# Patient Record
Sex: Male | Born: 1950 | Race: White | Hispanic: No | Smoking: Former smoker
Health system: Southern US, Community
[De-identification: ages and names within clinical notes are randomized; demographics above are authoritative.]

## PROBLEM LIST (undated history)

## (undated) DIAGNOSIS — E538 Deficiency of other specified B group vitamins: Secondary | ICD-10-CM

## (undated) DIAGNOSIS — Z87442 Personal history of urinary calculi: Secondary | ICD-10-CM

## (undated) DIAGNOSIS — Z860100 Personal history of colon polyps, unspecified: Secondary | ICD-10-CM

## (undated) DIAGNOSIS — I10 Essential (primary) hypertension: Secondary | ICD-10-CM

## (undated) DIAGNOSIS — N183 Chronic kidney disease, stage 3 unspecified: Secondary | ICD-10-CM

## (undated) DIAGNOSIS — E119 Type 2 diabetes mellitus without complications: Secondary | ICD-10-CM

## (undated) DIAGNOSIS — R42 Dizziness and giddiness: Secondary | ICD-10-CM

## (undated) DIAGNOSIS — I2584 Coronary atherosclerosis due to calcified coronary lesion: Secondary | ICD-10-CM

## (undated) DIAGNOSIS — Z794 Long term (current) use of insulin: Secondary | ICD-10-CM

## (undated) DIAGNOSIS — E785 Hyperlipidemia, unspecified: Secondary | ICD-10-CM

## (undated) DIAGNOSIS — N529 Male erectile dysfunction, unspecified: Secondary | ICD-10-CM

## (undated) DIAGNOSIS — Z6841 Body Mass Index (BMI) 40.0 and over, adult: Secondary | ICD-10-CM

## (undated) DIAGNOSIS — E669 Obesity, unspecified: Secondary | ICD-10-CM

## (undated) DIAGNOSIS — M109 Gout, unspecified: Secondary | ICD-10-CM

## (undated) DIAGNOSIS — R7989 Other specified abnormal findings of blood chemistry: Secondary | ICD-10-CM

## (undated) HISTORY — DX: Long term (current) use of insulin: E11.9

## (undated) HISTORY — DX: Coronary atherosclerosis due to calcified coronary lesion: I25.84

## (undated) HISTORY — DX: Male erectile dysfunction, unspecified: N52.9

## (undated) HISTORY — DX: Personal history of urinary calculi: Z87.442

## (undated) HISTORY — DX: Gout, unspecified: M10.9

## (undated) HISTORY — DX: Deficiency of other specified B group vitamins: E53.8

## (undated) HISTORY — DX: Type 2 diabetes mellitus without complications: Z79.4

## (undated) HISTORY — DX: Type 2 diabetes mellitus without complications: E11.9

## (undated) HISTORY — PX: KIDNEY STONE SURGERY: SHX686

## (undated) HISTORY — DX: Personal history of colonic polyps: Z86.010

## (undated) HISTORY — DX: Hyperlipidemia, unspecified: E78.5

## (undated) HISTORY — DX: Personal history of colon polyps, unspecified: Z86.0100

## (undated) HISTORY — DX: Dizziness and giddiness: R42

## (undated) HISTORY — DX: Other specified abnormal findings of blood chemistry: R79.89

## (undated) HISTORY — DX: Chronic kidney disease, stage 3 unspecified: N18.30

## (undated) HISTORY — DX: Obesity, unspecified: E66.9

## (undated) HISTORY — DX: Essential (primary) hypertension: I10

## (undated) HISTORY — DX: Body Mass Index (BMI) 40.0 and over, adult: Z684

---

## 1997-09-15 ENCOUNTER — Encounter: Admission: RE | Admit: 1997-09-15 | Discharge: 1997-12-14 | Payer: Self-pay | Admitting: Emergency Medicine

## 1999-02-20 ENCOUNTER — Encounter: Admission: RE | Admit: 1999-02-20 | Discharge: 1999-05-21 | Payer: Self-pay | Admitting: Emergency Medicine

## 2004-03-13 ENCOUNTER — Ambulatory Visit (HOSPITAL_BASED_OUTPATIENT_CLINIC_OR_DEPARTMENT_OTHER): Admission: RE | Admit: 2004-03-13 | Discharge: 2004-03-13 | Payer: Self-pay | Admitting: Urology

## 2007-03-02 ENCOUNTER — Ambulatory Visit (HOSPITAL_COMMUNITY): Admission: RE | Admit: 2007-03-02 | Discharge: 2007-03-02 | Payer: Self-pay | Admitting: Gastroenterology

## 2007-03-02 ENCOUNTER — Encounter (INDEPENDENT_AMBULATORY_CARE_PROVIDER_SITE_OTHER): Payer: Self-pay | Admitting: Gastroenterology

## 2007-05-26 ENCOUNTER — Encounter: Admission: RE | Admit: 2007-05-26 | Discharge: 2007-05-26 | Payer: Self-pay | Admitting: Emergency Medicine

## 2007-06-28 ENCOUNTER — Encounter: Admission: RE | Admit: 2007-06-28 | Discharge: 2007-06-28 | Payer: Self-pay | Admitting: Emergency Medicine

## 2009-12-21 ENCOUNTER — Inpatient Hospital Stay (HOSPITAL_COMMUNITY): Admission: EM | Admit: 2009-12-21 | Discharge: 2009-12-22 | Payer: Self-pay | Admitting: Emergency Medicine

## 2009-12-21 IMAGING — CR DG CHEST 1V PORT
1 series · 1 of 1 positions shown · non-contrast
Comparison: Chest radiograph performed [DATE]

CLINICAL DATA: Mid chest pain.

PORTABLE CHEST - 1 VIEW

[AP]
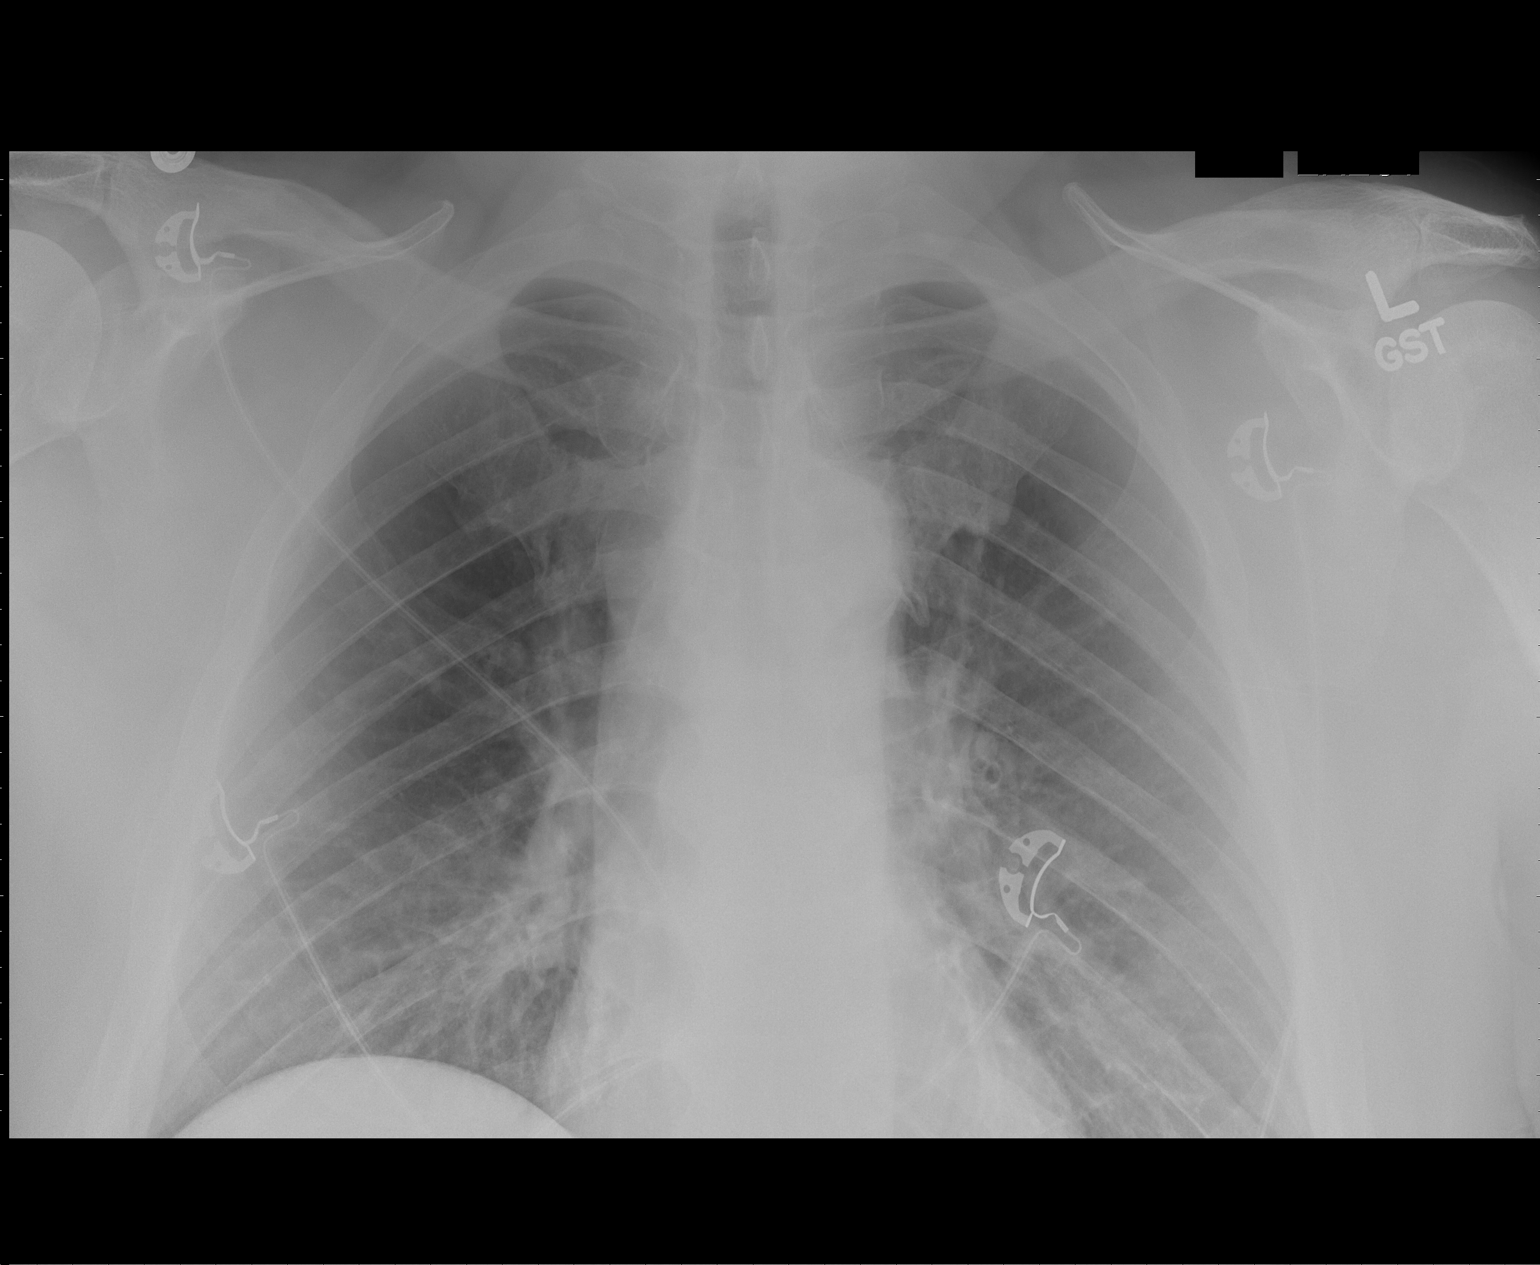

[1 of 1 positions shown; findings below may reference images not displayed]

FINDINGS: The lungs are well-aerated; mild peribronchial thickening
is noted.  There is no evidence of focal opacification, pleural
effusion or pneumothorax. The left costophrenic angle is
incompletely imaged on this study.

The cardiomediastinal silhouette is within normal limits.
Pulmonary vascularity is at the upper limits of normal.  No acute
osseous abnormalities are seen.
IMPRESSION: Mild peribronchial thickening noted; lungs otherwise clear.

## 2010-07-09 ENCOUNTER — Emergency Department (HOSPITAL_COMMUNITY)
Admission: EM | Admit: 2010-07-09 | Discharge: 2010-07-09 | Disposition: A | Discharge status: Home / Self Care | Payer: BC Managed Care – PPO | Attending: Emergency Medicine | Admitting: Emergency Medicine

## 2010-07-09 DIAGNOSIS — R109 Unspecified abdominal pain: Secondary | ICD-10-CM | POA: Insufficient documentation

## 2010-07-09 DIAGNOSIS — R61 Generalized hyperhidrosis: Secondary | ICD-10-CM | POA: Insufficient documentation

## 2010-07-09 DIAGNOSIS — N200 Calculus of kidney: Secondary | ICD-10-CM | POA: Insufficient documentation

## 2010-07-09 DIAGNOSIS — N210 Calculus in bladder: Secondary | ICD-10-CM | POA: Insufficient documentation

## 2010-07-10 ENCOUNTER — Emergency Department (HOSPITAL_COMMUNITY): Payer: BC Managed Care – PPO

## 2010-07-10 ENCOUNTER — Encounter (HOSPITAL_COMMUNITY): Payer: Self-pay | Admitting: Emergency Medicine

## 2010-08-24 LAB — BASIC METABOLIC PANEL
BUN: 19 mg/dL (ref 6–23)
CO2: 25 mEq/L (ref 19–32)
CO2: 29 mEq/L (ref 19–32)
Calcium: 9.4 mg/dL (ref 8.4–10.5)
Chloride: 99 mEq/L (ref 96–112)
Chloride: 99 mEq/L (ref 96–112)
Creatinine, Ser: 1.28 mg/dL (ref 0.4–1.5)
GFR calc Af Amer: >60 mL/min (ref >60)
GFR calc Af Amer: >60 mL/min (ref >60)
GFR calc non Af Amer: 57 mL/min — ABNORMAL LOW (ref >60)
Glucose, Bld: 240 mg/dL — ABNORMAL HIGH (ref 70–99)
Potassium: 4.1 mEq/L (ref 3.5–5.1)
Sodium: 136 mEq/L (ref 135–145)
Sodium: 137 mEq/L (ref 135–145)

## 2010-08-24 LAB — CARDIAC PANEL(CRET KIN+CKTOT+MB+TROPI)
CK, MB: 0.9 ng/mL (ref 0.3–4.0)
CK, MB: 1 ng/mL (ref 0.3–4.0)
Relative Index: INVALID (ref 0.0–2.5)
Relative Index: INVALID (ref 0.0–2.5)
Total CK: 75 U/L (ref 7–232)
Total CK: 75 U/L (ref 7–232)
Troponin I: 0.01 ng/mL (ref 0.00–0.06)
Troponin I: <0.01 ng/mL (ref 0.00–0.06)

## 2010-08-24 LAB — HEPATIC FUNCTION PANEL
ALT: 28 U/L (ref 0–53)
AST: 29 U/L (ref 0–37)
Albumin: 3.8 g/dL (ref 3.5–5.2)
Alkaline Phosphatase: 71 U/L (ref 39–117)
Bilirubin, Direct: 0.2 mg/dL (ref 0.0–0.3)
Indirect Bilirubin: 0.5 mg/dL (ref 0.3–0.9)
Total Bilirubin: 0.7 mg/dL (ref 0.3–1.2)
Total Protein: 7.3 g/dL (ref 6.0–8.3)

## 2010-08-24 LAB — GLUCOSE, CAPILLARY
Glucose-Capillary: 140 mg/dL — ABNORMAL HIGH (ref 70–99)
Glucose-Capillary: 156 mg/dL — ABNORMAL HIGH (ref 70–99)
Glucose-Capillary: 161 mg/dL — ABNORMAL HIGH (ref 70–99)
Glucose-Capillary: 186 mg/dL — ABNORMAL HIGH (ref 70–99)
Glucose-Capillary: 232 mg/dL — ABNORMAL HIGH (ref 70–99)

## 2010-08-24 LAB — PROTIME-INR
INR: 1.03 (ref 0.00–1.49)
Prothrombin Time: 13.4 seconds (ref 11.6–15.2)

## 2010-08-24 LAB — LIPID PANEL
Cholesterol: 108 mg/dL (ref 0–200)
HDL: 29 mg/dL — ABNORMAL LOW (ref >39)
LDL Cholesterol: 38 mg/dL (ref 0–99)
Total CHOL/HDL Ratio: 3.7 RATIO
Triglycerides: 205 mg/dL — ABNORMAL HIGH (ref <150)
VLDL: 41 mg/dL — ABNORMAL HIGH (ref 0–40)

## 2010-08-24 LAB — CBC
HCT: 45.8 % (ref 39.0–52.0)
Hemoglobin: 13.5 g/dL (ref 13.0–17.0)
Hemoglobin: 15.5 g/dL (ref 13.0–17.0)
MCH: 29.6 pg (ref 26.0–34.0)
MCH: 30 pg (ref 26.0–34.0)
MCHC: 33.8 g/dL (ref 30.0–36.0)
MCV: 87.6 fL (ref 78.0–100.0)
MCV: 87.6 fL (ref 78.0–100.0)
Platelets: 248 10*3/uL (ref 150–400)
RBC: 4.52 MIL/uL (ref 4.22–5.81)
RBC: 5.22 MIL/uL (ref 4.22–5.81)
RDW: 12.8 % (ref 11.5–15.5)
WBC: 21.6 10*3/uL — ABNORMAL HIGH (ref 4.0–10.5)
WBC: 9.9 10*3/uL (ref 4.0–10.5)

## 2010-08-24 LAB — CK TOTAL AND CKMB (NOT AT ARMC)
CK, MB: 1.1 ng/mL (ref 0.3–4.0)
Relative Index: INVALID (ref 0.0–2.5)
Total CK: 56 U/L (ref 7–232)

## 2010-08-24 LAB — POCT CARDIAC MARKERS
CKMB, poc: <1 ng/mL — ABNORMAL LOW (ref 1.0–8.0)
Myoglobin, poc: 67 ng/mL (ref 12–200)
Troponin i, poc: <0.05 ng/mL (ref 0.00–0.09)

## 2010-08-24 LAB — D-DIMER, QUANTITATIVE: D-Dimer, Quant: 3.76 ug/mL-FEU — ABNORMAL HIGH (ref 0.00–0.48)

## 2010-08-24 LAB — APTT: aPTT: 28 seconds (ref 24–37)

## 2010-08-24 LAB — TROPONIN I: Troponin I: 0.01 ng/mL (ref 0.00–0.06)

## 2010-10-22 NOTE — Op Note (Signed)
Steven Mcclure, Steven Mcclure               ACCOUNT NO.:  [id number]   MEDICAL RECORD NO.:  [id number]          PATIENT TYPE:  AMB   LOCATION:  ENDO                         FACILITY:  [facility]   PHYSICIAN:  James L. Malon Kindle., M.D.DATE OF BIRTH:  Aug 23, 1950   DATE OF PROCEDURE:  03/02/2007  DATE OF DISCHARGE:                               OPERATIVE REPORT   PROCEDURE:  Colonoscopy, polypectomy.   MEDICATIONS:  Fentanyl 125 mcg, Versed 8 mg IV.   INDICATIONS:  The patient had colonoscopy done in July with removal of  large polyp from the cecum.  It was not clear that this was all removed.  This was about 1.5 cm in diameter, was a villous polyp.  He is coming  back in three months for a repeat.  The plan is to use argon plasma  coagulator to cauterize any residual polyp left.   DESCRIPTION OF PROCEDURE:  The procedure been explained to the patient  and consent obtained.  With the patient in left lateral decubitus  position, the Pentax adult scope was inserted and advanced.  The prep  was excellent and we were able to reach the cecum without difficulty.  The ileocecal valve and appendiceal orifice were clearly seen.  The  scope was withdrawn.  The area carefully examined.  Just above the  ileocecal valve and on opposing wall was a 0.5 cm residual polyp.  This  was only abnormality seen.  This was removed with a snare and sucked  through the scope.  Initially it was lost and then was found.  Unfortunately the staff had not hooked up the cautery and it was taken  off with cold polypectomy.  There was bleeding.  I cauterize the tip of  the tip of the snare and injected 1 mL of epinephrine with good control  bleeding.  The remainder of the colon was normal until I reached the mid  descending colon where a 0.5 cm polyp was found and snared and  recovered.  There was diverticulosis throughout the left colon as  before.  The scope was withdrawn.  The patient tolerated procedure well.  There were no  immediate problems.   ASSESSMENT:  1. Polyps removed from the ascending and descending colon.  2. Diverticulosis.   PLAN:  Routine instructions.  Will go ahead and check the path and  likely recommend repeating procedure in a year or so.           ______________________________  Llana Aliment. Malon Kindle., M.D.     Waldron Session  D:  03/02/2007  T:  03/03/2007  Job:  [id number]   cc:   Oley Balm. Georgina Pillion, M.D.  Fax: [phone]

## 2010-10-25 NOTE — Op Note (Signed)
NAMEJAMEIS, NEWSHAM               ACCOUNT NO.:  [id number]   MEDICAL RECORD NO.:  [id number]          PATIENT TYPE:  AMB   LOCATION:  [facility]                         FACILITY:  [facility]   PHYSICIAN:  Rozanna Boer., M.D.DATE OF BIRTH:  05/09/1951   DATE OF PROCEDURE:  03/13/2004  DATE OF DISCHARGE:                                 OPERATIVE REPORT   PREOPERATIVE DIAGNOSIS:  Left distal ureteral stone with obstruction.   POSTOPERATIVE DIAGNOSIS:  Left distal ureteral stone with obstruction.   OPERATION:  1.  Cystoscopy.  2.  Left ureteroscopy with stone extraction.  3.  Insertion of left ureteral stent.   ANESTHESIA:  General.   SURGEON:  Courtney Paris, M.D.   BRIEF HISTORY:  This 60 year old patient is admitted with a left distal  ureteral stone, 4-5 mm, for ureteroscopy.  He had ureteroscopy x 3 in 1987  and then had open ureteral lithotomy on the left at that time.  He had  another ureteroscopy in 1991.  He has type 2 diabetes and enters now for a  distal left stone that has failed to progress with persistent pain.   The patient was placed on the operating table in the dorsal lithotomy  position after satisfactory induction of general anesthesia; prepped and  draped with Betadine in the usual sterile fashion.  The 21 panendoscope was  inserted.  The anterior urethra was free of any strictures.  The posterior  urethra was nonobstructing.  The bladder was entered.  The bladder was  smooth, nontrabeculated, but the left orifice had a fairly sharp angulation  to it.  I had to use a 6 open-ended ureteral catheter with a Glidewire  through it to be able to get into the orifice and then was able to pass the  Glidewire past the stone, and then the open-ended catheter was then advanced  over the Glidewire.  The Glidewire was then removed and replaced with a  guidewire, and the open-ended catheter was then removed.  I was afraid that  this was near the point of the old  ureterotomy when he had the stone removed  years earlier, so I went ahead and passed a 4 cm ureteral balloon dilator  and inflated this to 12 atmospheres for 3 timed minutes.  The balloon was  then deflated, and the 6 short ureteroscope was then passed up the ureter,  past the stone which was then grasped with a 4-wire flat basket and then  removed intact.  Another passage with the stone, looked at this area.  It  did not look like it was severely strictured and was fairly open but since I  had it dilated, I thought it would be best to leave a ureteral stent.  I  then re-passed the cystoscope and backloaded a 6 French 26 cm length double-  J ureteral stent over the guidewire and under fluoroscopy placed the stent  with the proximal coil in the renal pelvis and the distal coil in the  bladder in a satisfactory manner.  A string was left on the end which was  brought out  through the penis and taped to  the penis.  The patient was then given some Toradol and a B&O suppository  and taken to the recovery room in good condition to be later discharged as  an outpatient.  He will come back in 48 hours, and we will remove the stent  at that time.      HMK/MEDQ  D:  03/13/2004  T:  03/13/2004  Job:  [id number]

## 2011-10-27 ENCOUNTER — Other Ambulatory Visit: Payer: Self-pay | Admitting: Gastroenterology

## 2013-11-25 ENCOUNTER — Other Ambulatory Visit: Payer: Self-pay | Admitting: Dermatology

## 2016-09-02 ENCOUNTER — Ambulatory Visit (INDEPENDENT_AMBULATORY_CARE_PROVIDER_SITE_OTHER): Payer: PPO | Admitting: Podiatry

## 2016-09-02 ENCOUNTER — Ambulatory Visit (INDEPENDENT_AMBULATORY_CARE_PROVIDER_SITE_OTHER): Payer: PPO

## 2016-09-02 ENCOUNTER — Encounter: Payer: Self-pay | Admitting: Podiatry

## 2016-09-02 VITALS — BP 159/88 | HR 84 | Resp 16

## 2016-09-02 DIAGNOSIS — M79672 Pain in left foot: Secondary | ICD-10-CM

## 2016-09-02 DIAGNOSIS — M7752 Other enthesopathy of left foot: Secondary | ICD-10-CM | POA: Diagnosis not present

## 2016-09-02 DIAGNOSIS — M778 Other enthesopathies, not elsewhere classified: Secondary | ICD-10-CM

## 2016-09-02 DIAGNOSIS — M779 Enthesopathy, unspecified: Secondary | ICD-10-CM

## 2016-09-02 NOTE — Progress Notes (Signed)
   Subjective:    Patient ID: Steven Mcclure, male    DOB: 06-28-1950, 66 y.o.   MRN: [id number]  HPI: He presents today with a chief complaint of pain to the anterolateral ankle. He states this been going on for about 8 years on and off but seems to be worse over the past day or 2 so bad that he could hardly walk on it. He states that he has had a cyst removed the arthroscopy of the left ankle in the past. He said yesterday was so severely painful he states that he still limping today. He feels that he may have contributed to this soreness by overworking putting out mulch on the weekend.    Review of Systems  Musculoskeletal: Positive for arthralgias and gait problem.  All other systems reviewed and are negative.      Objective:   Physical Exam: Vital signs are stable he is alert and oriented 3 pulses are palpable. Neurologic sensorium is intact. Reflexes are intact. Muscle strength is 5 over 5 dorsiflexion plantar flexors and inverters everters all of his musculature is intact. Orthopedic evaluation of her symptoms all joint systems and ankle full range of motion without crepitation. He has pain on end range of motion of the subtalar joint of the left foot. Radiographs taken today do not demonstrate any type of major osseous abnormalities in this area however I began to question on the oblique view whether or not there is signs of a possible synchondrosis or syndesmosis. This will be at the level of the calcaneonavicular joint. This could relate to some of the tenderness that he has on palpation of the sinus tarsi and oriented range of motion. Cutaneous evaluation demonstrates no erythematous mild edema.        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  Plan: I injected sinus tarsi today with Kenalog and local anesthetic after sterile Betadine skin prep he tolerated the procedure well and I will follow-up with him in 1-2 weeks we discussed  appropriate shoe gear stretching exercises ice therapy and shoe modifications. I also placed him in a Tri-Lock brace.

## 2016-09-06 ENCOUNTER — Telehealth: Payer: Self-pay | Admitting: Podiatry

## 2016-09-06 NOTE — Telephone Encounter (Signed)
  I spoke with the patient's fiance, Carmelia Roller, regarding patient Steven Mcclure. She states that he is an significant pain to his ankle joint. She states that the injection she received earlier this week helped for approximately one day. Patient is unable to walk without pain. She states that he took 8 Tylenol pills and 8 aspirin pills throughout the night. I called in a prescription for tramadol 50mg  and Motrin 800mg . Go to Emergency Dept if condition worsens.   Edrick Kins, DPM [facility]  Dr. Edrick Kins, [address]                                        [place], [place] [address]                Office [phone]  Fax [phone]

## 2016-09-23 ENCOUNTER — Ambulatory Visit (INDEPENDENT_AMBULATORY_CARE_PROVIDER_SITE_OTHER): Payer: PPO | Admitting: Podiatry

## 2016-09-23 ENCOUNTER — Encounter: Payer: Self-pay | Admitting: Podiatry

## 2016-09-23 DIAGNOSIS — M778 Other enthesopathies, not elsewhere classified: Secondary | ICD-10-CM

## 2016-09-23 DIAGNOSIS — M779 Enthesopathy, unspecified: Principal | ICD-10-CM

## 2016-09-23 DIAGNOSIS — M7752 Other enthesopathy of left foot: Secondary | ICD-10-CM

## 2016-09-23 MED ORDER — TRAMADOL HCL 50 MG PO TABS: 50.0000 mg | ORAL_TABLET | Freq: Three times a day (TID) | ORAL | 0 refills | Status: DC | PRN

## 2016-09-23 MED ORDER — MELOXICAM 15 MG PO TABS: 15.0000 mg | ORAL_TABLET | Freq: Every day | ORAL | 3 refills | Status: DC

## 2016-09-23 NOTE — Progress Notes (Signed)
He presents today for follow-up of his subtalar joint capsulitis left foot. He states that he was very tender and painful just the other day and he called in and was prescribed tramadol.  Objective: Vital signs are stable he is alert and oriented 3. Still has some tenderness on palpation of the sinus tarsi and on in range of motion of the subtalar joint of the left foot.  Assessment: Subtalar joint capsulitis and neuritis  Plan: Injected Kenalog and local anesthetic. On maximal tenderness today into the sinus tarsi. Also wrote a prescription for tramadol. I also wrote another prescription for meloxicam. Discussed appropriate shoe gear stretching exercises ice therapy shoe modifications follow up with him in 1 month if necessary.

## 2016-10-21 ENCOUNTER — Ambulatory Visit: Payer: PPO | Admitting: Podiatry

## 2016-11-13 DIAGNOSIS — E119 Type 2 diabetes mellitus without complications: Secondary | ICD-10-CM | POA: Diagnosis not present

## 2016-12-01 DIAGNOSIS — L570 Actinic keratosis: Secondary | ICD-10-CM | POA: Diagnosis not present

## 2016-12-01 DIAGNOSIS — D225 Melanocytic nevi of trunk: Secondary | ICD-10-CM | POA: Diagnosis not present

## 2016-12-01 DIAGNOSIS — D1801 Hemangioma of skin and subcutaneous tissue: Secondary | ICD-10-CM | POA: Diagnosis not present

## 2016-12-01 DIAGNOSIS — Z85828 Personal history of other malignant neoplasm of skin: Secondary | ICD-10-CM | POA: Diagnosis not present

## 2016-12-01 DIAGNOSIS — D2371 Other benign neoplasm of skin of right lower limb, including hip: Secondary | ICD-10-CM | POA: Diagnosis not present

## 2016-12-01 DIAGNOSIS — L820 Inflamed seborrheic keratosis: Secondary | ICD-10-CM | POA: Diagnosis not present

## 2016-12-01 DIAGNOSIS — D2372 Other benign neoplasm of skin of left lower limb, including hip: Secondary | ICD-10-CM | POA: Diagnosis not present

## 2016-12-01 DIAGNOSIS — D235 Other benign neoplasm of skin of trunk: Secondary | ICD-10-CM | POA: Diagnosis not present

## 2016-12-01 DIAGNOSIS — L821 Other seborrheic keratosis: Secondary | ICD-10-CM | POA: Diagnosis not present

## 2016-12-16 DIAGNOSIS — E538 Deficiency of other specified B group vitamins: Secondary | ICD-10-CM | POA: Diagnosis not present

## 2016-12-16 DIAGNOSIS — Z6836 Body mass index (BMI) 36.0-36.9, adult: Secondary | ICD-10-CM | POA: Diagnosis not present

## 2016-12-16 DIAGNOSIS — E1142 Type 2 diabetes mellitus with diabetic polyneuropathy: Secondary | ICD-10-CM | POA: Diagnosis not present

## 2016-12-16 DIAGNOSIS — E669 Obesity, unspecified: Secondary | ICD-10-CM | POA: Diagnosis not present

## 2016-12-16 DIAGNOSIS — Z794 Long term (current) use of insulin: Secondary | ICD-10-CM | POA: Diagnosis not present

## 2016-12-16 DIAGNOSIS — E539 Vitamin B deficiency, unspecified: Secondary | ICD-10-CM | POA: Diagnosis not present

## 2016-12-26 DIAGNOSIS — N289 Disorder of kidney and ureter, unspecified: Secondary | ICD-10-CM | POA: Diagnosis not present

## 2017-01-26 DIAGNOSIS — E1142 Type 2 diabetes mellitus with diabetic polyneuropathy: Secondary | ICD-10-CM | POA: Diagnosis not present

## 2017-01-26 DIAGNOSIS — Z794 Long term (current) use of insulin: Secondary | ICD-10-CM | POA: Diagnosis not present

## 2017-01-26 DIAGNOSIS — Z7984 Long term (current) use of oral hypoglycemic drugs: Secondary | ICD-10-CM | POA: Diagnosis not present

## 2017-01-27 DIAGNOSIS — Z86010 Personal history of colonic polyps: Secondary | ICD-10-CM | POA: Diagnosis not present

## 2017-01-27 DIAGNOSIS — D124 Benign neoplasm of descending colon: Secondary | ICD-10-CM | POA: Diagnosis not present

## 2017-01-27 DIAGNOSIS — K640 First degree hemorrhoids: Secondary | ICD-10-CM | POA: Diagnosis not present

## 2017-01-27 DIAGNOSIS — K5730 Diverticulosis of large intestine without perforation or abscess without bleeding: Secondary | ICD-10-CM | POA: Diagnosis not present

## 2017-01-29 DIAGNOSIS — D124 Benign neoplasm of descending colon: Secondary | ICD-10-CM | POA: Diagnosis not present

## 2017-02-11 DIAGNOSIS — N400 Enlarged prostate without lower urinary tract symptoms: Secondary | ICD-10-CM | POA: Diagnosis not present

## 2017-02-11 DIAGNOSIS — N5201 Erectile dysfunction due to arterial insufficiency: Secondary | ICD-10-CM | POA: Diagnosis not present

## 2017-03-06 DIAGNOSIS — Z23 Encounter for immunization: Secondary | ICD-10-CM | POA: Diagnosis not present

## 2017-04-21 ENCOUNTER — Ambulatory Visit: Payer: PPO | Admitting: Podiatry

## 2017-04-21 ENCOUNTER — Encounter: Payer: Self-pay | Admitting: Podiatry

## 2017-04-21 DIAGNOSIS — M76822 Posterior tibial tendinitis, left leg: Secondary | ICD-10-CM

## 2017-04-21 DIAGNOSIS — M7752 Other enthesopathy of left foot: Secondary | ICD-10-CM

## 2017-04-21 DIAGNOSIS — M779 Enthesopathy, unspecified: Principal | ICD-10-CM

## 2017-04-21 DIAGNOSIS — M778 Other enthesopathies, not elsewhere classified: Secondary | ICD-10-CM

## 2017-04-21 MED ORDER — MELOXICAM 15 MG PO TABS: 15.0000 mg | ORAL_TABLET | Freq: Every day | ORAL | 3 refills | Status: DC

## 2017-04-21 NOTE — Progress Notes (Signed)
He presents today for follow-up of his subtalar joint capsulitis. He states that has some swelling in here and some tenderness as he points to the medial aspect of the left ankle as well. He states that his injections are lasting usually about 6 months now he is happy with that.  Objective: Vital signs are stable he is alert and oriented 3 months tenderness on palpation of the insertion site of posterior tibial tendon with mild overlying edema of the tendon appears to be intact with good inversion against resistance. He also has some tenderness on palpation of the sinus tarsi and range of motion of the subtalar joint.  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  Plan: Discussed etiology pathology conservative versus surgical therapies. At this point injected dexamethasone to the posterior tibial tendon and Kenalog to the subtalar joint of the left foot after sterile Betadine skin prep. Refilled his meloxicam. I will follow-up with him on an as-needed basis.

## 2017-05-06 DIAGNOSIS — G4733 Obstructive sleep apnea (adult) (pediatric): Secondary | ICD-10-CM | POA: Diagnosis not present

## 2017-06-16 DIAGNOSIS — I129 Hypertensive chronic kidney disease with stage 1 through stage 4 chronic kidney disease, or unspecified chronic kidney disease: Secondary | ICD-10-CM | POA: Diagnosis not present

## 2017-06-16 DIAGNOSIS — N183 Chronic kidney disease, stage 3 (moderate): Secondary | ICD-10-CM | POA: Diagnosis not present

## 2017-06-24 DIAGNOSIS — E1142 Type 2 diabetes mellitus with diabetic polyneuropathy: Secondary | ICD-10-CM | POA: Diagnosis not present

## 2017-06-24 DIAGNOSIS — E785 Hyperlipidemia, unspecified: Secondary | ICD-10-CM | POA: Diagnosis not present

## 2017-06-24 DIAGNOSIS — Z794 Long term (current) use of insulin: Secondary | ICD-10-CM | POA: Diagnosis not present

## 2017-06-24 DIAGNOSIS — I10 Essential (primary) hypertension: Secondary | ICD-10-CM | POA: Diagnosis not present

## 2017-06-24 DIAGNOSIS — Z6839 Body mass index (BMI) 39.0-39.9, adult: Secondary | ICD-10-CM | POA: Diagnosis not present

## 2017-06-25 DIAGNOSIS — E1142 Type 2 diabetes mellitus with diabetic polyneuropathy: Secondary | ICD-10-CM | POA: Diagnosis not present

## 2017-06-25 DIAGNOSIS — Z794 Long term (current) use of insulin: Secondary | ICD-10-CM | POA: Diagnosis not present

## 2017-06-25 DIAGNOSIS — Z6839 Body mass index (BMI) 39.0-39.9, adult: Secondary | ICD-10-CM | POA: Diagnosis not present

## 2017-06-25 DIAGNOSIS — E669 Obesity, unspecified: Secondary | ICD-10-CM | POA: Diagnosis not present

## 2017-06-25 DIAGNOSIS — E538 Deficiency of other specified B group vitamins: Secondary | ICD-10-CM | POA: Diagnosis not present

## 2017-08-20 DIAGNOSIS — H5789 Other specified disorders of eye and adnexa: Secondary | ICD-10-CM | POA: Diagnosis not present

## 2017-10-14 DIAGNOSIS — G4733 Obstructive sleep apnea (adult) (pediatric): Secondary | ICD-10-CM | POA: Diagnosis not present

## 2017-12-03 DIAGNOSIS — D235 Other benign neoplasm of skin of trunk: Secondary | ICD-10-CM | POA: Diagnosis not present

## 2017-12-03 DIAGNOSIS — L821 Other seborrheic keratosis: Secondary | ICD-10-CM | POA: Diagnosis not present

## 2017-12-03 DIAGNOSIS — D225 Melanocytic nevi of trunk: Secondary | ICD-10-CM | POA: Diagnosis not present

## 2017-12-03 DIAGNOSIS — Z85828 Personal history of other malignant neoplasm of skin: Secondary | ICD-10-CM | POA: Diagnosis not present

## 2017-12-03 DIAGNOSIS — L570 Actinic keratosis: Secondary | ICD-10-CM | POA: Diagnosis not present

## 2017-12-03 DIAGNOSIS — D1801 Hemangioma of skin and subcutaneous tissue: Secondary | ICD-10-CM | POA: Diagnosis not present

## 2017-12-03 DIAGNOSIS — L814 Other melanin hyperpigmentation: Secondary | ICD-10-CM | POA: Diagnosis not present

## 2017-12-15 DIAGNOSIS — E119 Type 2 diabetes mellitus without complications: Secondary | ICD-10-CM | POA: Diagnosis not present

## 2017-12-15 DIAGNOSIS — Z794 Long term (current) use of insulin: Secondary | ICD-10-CM | POA: Diagnosis not present

## 2017-12-15 DIAGNOSIS — Z7984 Long term (current) use of oral hypoglycemic drugs: Secondary | ICD-10-CM | POA: Diagnosis not present

## 2017-12-15 DIAGNOSIS — H52223 Regular astigmatism, bilateral: Secondary | ICD-10-CM | POA: Diagnosis not present

## 2017-12-15 DIAGNOSIS — H524 Presbyopia: Secondary | ICD-10-CM | POA: Diagnosis not present

## 2017-12-15 DIAGNOSIS — H25099 Other age-related incipient cataract, unspecified eye: Secondary | ICD-10-CM | POA: Diagnosis not present

## 2017-12-15 DIAGNOSIS — H5203 Hypermetropia, bilateral: Secondary | ICD-10-CM | POA: Diagnosis not present

## 2017-12-24 DIAGNOSIS — E538 Deficiency of other specified B group vitamins: Secondary | ICD-10-CM | POA: Diagnosis not present

## 2017-12-24 DIAGNOSIS — E669 Obesity, unspecified: Secondary | ICD-10-CM | POA: Diagnosis not present

## 2017-12-24 DIAGNOSIS — Z794 Long term (current) use of insulin: Secondary | ICD-10-CM | POA: Diagnosis not present

## 2017-12-24 DIAGNOSIS — E1142 Type 2 diabetes mellitus with diabetic polyneuropathy: Secondary | ICD-10-CM | POA: Diagnosis not present

## 2017-12-28 DIAGNOSIS — M25562 Pain in left knee: Secondary | ICD-10-CM | POA: Diagnosis not present

## 2017-12-30 ENCOUNTER — Ambulatory Visit (INDEPENDENT_AMBULATORY_CARE_PROVIDER_SITE_OTHER): Payer: PPO | Admitting: Surgery

## 2018-03-01 DIAGNOSIS — N400 Enlarged prostate without lower urinary tract symptoms: Secondary | ICD-10-CM | POA: Diagnosis not present

## 2018-03-08 DIAGNOSIS — N5201 Erectile dysfunction due to arterial insufficiency: Secondary | ICD-10-CM | POA: Diagnosis not present

## 2018-03-08 DIAGNOSIS — N400 Enlarged prostate without lower urinary tract symptoms: Secondary | ICD-10-CM | POA: Diagnosis not present

## 2018-06-21 DIAGNOSIS — G629 Polyneuropathy, unspecified: Secondary | ICD-10-CM | POA: Diagnosis not present

## 2018-06-21 DIAGNOSIS — E669 Obesity, unspecified: Secondary | ICD-10-CM | POA: Diagnosis not present

## 2018-06-21 DIAGNOSIS — Z794 Long term (current) use of insulin: Secondary | ICD-10-CM | POA: Diagnosis not present

## 2018-06-21 DIAGNOSIS — Z6839 Body mass index (BMI) 39.0-39.9, adult: Secondary | ICD-10-CM | POA: Diagnosis not present

## 2018-06-21 DIAGNOSIS — E1142 Type 2 diabetes mellitus with diabetic polyneuropathy: Secondary | ICD-10-CM | POA: Diagnosis not present

## 2018-06-21 DIAGNOSIS — E1165 Type 2 diabetes mellitus with hyperglycemia: Secondary | ICD-10-CM | POA: Diagnosis not present

## 2018-06-21 DIAGNOSIS — R6889 Other general symptoms and signs: Secondary | ICD-10-CM | POA: Diagnosis not present

## 2018-06-25 DIAGNOSIS — J208 Acute bronchitis due to other specified organisms: Secondary | ICD-10-CM | POA: Diagnosis not present

## 2018-07-12 DIAGNOSIS — G4733 Obstructive sleep apnea (adult) (pediatric): Secondary | ICD-10-CM | POA: Diagnosis not present

## 2018-07-19 DIAGNOSIS — I129 Hypertensive chronic kidney disease with stage 1 through stage 4 chronic kidney disease, or unspecified chronic kidney disease: Secondary | ICD-10-CM | POA: Diagnosis not present

## 2018-07-19 DIAGNOSIS — N183 Chronic kidney disease, stage 3 (moderate): Secondary | ICD-10-CM | POA: Diagnosis not present

## 2018-08-06 ENCOUNTER — Ambulatory Visit (INDEPENDENT_AMBULATORY_CARE_PROVIDER_SITE_OTHER): Payer: PPO

## 2018-08-06 ENCOUNTER — Other Ambulatory Visit: Payer: Self-pay | Admitting: Podiatry

## 2018-08-06 ENCOUNTER — Ambulatory Visit: Payer: PPO | Admitting: Podiatry

## 2018-08-06 DIAGNOSIS — M778 Other enthesopathies, not elsewhere classified: Secondary | ICD-10-CM

## 2018-08-06 DIAGNOSIS — M79671 Pain in right foot: Secondary | ICD-10-CM

## 2018-08-06 DIAGNOSIS — M109 Gout, unspecified: Secondary | ICD-10-CM

## 2018-08-06 DIAGNOSIS — M1000 Idiopathic gout, unspecified site: Secondary | ICD-10-CM

## 2018-08-06 DIAGNOSIS — M779 Enthesopathy, unspecified: Secondary | ICD-10-CM

## 2018-08-06 MED ORDER — TRIAMCINOLONE ACETONIDE 10 MG/ML IJ SUSP
10.0000 mg | Freq: Once | INTRAMUSCULAR | Status: AC
  Administered 2018-08-06: 10 mg

## 2018-08-06 MED ORDER — COLCHICINE 0.6 MG PO TABS: 0.6000 mg | ORAL_TABLET | Freq: Every day | ORAL | 0 refills | Status: DC

## 2018-08-07 ENCOUNTER — Other Ambulatory Visit: Payer: Self-pay | Admitting: Podiatry

## 2018-08-07 LAB — COMPLETE METABOLIC PANEL WITH GFR
AG Ratio: 1.3 (calc) (ref 1.0–2.5)
ALKALINE PHOSPHATASE (APISO): 71 U/L (ref 35–144)
ALT: 13 U/L (ref 9–46)
AST: 13 U/L (ref 10–35)
Albumin: 4.2 g/dL (ref 3.6–5.1)
BUN/Creatinine Ratio: 23 (calc) — ABNORMAL HIGH (ref 6–22)
BUN: 35 mg/dL — ABNORMAL HIGH (ref 7–25)
CHLORIDE: 100 mmol/L (ref 98–110)
CO2: 29 mmol/L (ref 20–32)
CREATININE: 1.52 mg/dL — AB (ref 0.70–1.25)
Calcium: 9.5 mg/dL (ref 8.6–10.3)
GFR, Est African American: 53 mL/min/{1.73_m2} — ABNORMAL LOW (ref >=60)
GFR, Est Non African American: 46 mL/min/{1.73_m2} — ABNORMAL LOW (ref >=60)
GLOBULIN: 3.3 g/dL (ref 1.9–3.7)
Glucose, Bld: 126 mg/dL — ABNORMAL HIGH (ref 65–99)
POTASSIUM: 4.7 mmol/L (ref 3.5–5.3)
SODIUM: 138 mmol/L (ref 135–146)
Total Bilirubin: 0.6 mg/dL (ref 0.2–1.2)
Total Protein: 7.5 g/dL (ref 6.1–8.1)

## 2018-08-07 LAB — CBC WITH DIFFERENTIAL/PLATELET
Absolute Monocytes: 1309 cells/uL — ABNORMAL HIGH (ref 200–950)
BASOS PCT: 0.7 %
Basophils Absolute: 108 cells/uL (ref 0–200)
EOS ABS: 108 {cells}/uL (ref 15–500)
EOS PCT: 0.7 %
HCT: 43.1 % (ref 38.5–50.0)
HEMOGLOBIN: 14.9 g/dL (ref 13.2–17.1)
Lymphs Abs: 2941 cells/uL (ref 850–3900)
MCH: 29.2 pg (ref 27.0–33.0)
MCHC: 34.6 g/dL (ref 32.0–36.0)
MCV: 84.3 fL (ref 80.0–100.0)
MONOS PCT: 8.5 %
MPV: 9.4 fL (ref 7.5–12.5)
NEUTROS ABS: 10934 {cells}/uL — AB (ref 1500–7800)
Neutrophils Relative %: 71 %
PLATELETS: 344 10*3/uL (ref 140–400)
RBC: 5.11 10*6/uL (ref 4.20–5.80)
RDW: 12.9 % (ref 11.0–15.0)
TOTAL LYMPHOCYTE: 19.1 %
WBC: 15.4 10*3/uL — ABNORMAL HIGH (ref 3.8–10.8)

## 2018-08-07 LAB — URIC ACID: URIC ACID, SERUM: 10 mg/dL — AB (ref 4.0–8.0)

## 2018-08-07 MED ORDER — DOXYCYCLINE HYCLATE 100 MG PO TABS: 100.0000 mg | ORAL_TABLET | Freq: Two times a day (BID) | ORAL | 0 refills | Status: DC

## 2018-08-07 NOTE — Progress Notes (Signed)
Subjective: 68 year old male presents the office today for concerns of right foot pain and swelling.  This started last Friday he states it came on quite suddenly.  He points on the first MPJ where he gets the majority of tenderness he also points the ankle.  He has not had any recent injury he denies any open sores.  He points more along the medial aspect the foot where he gets discomfort.  He has noticed increasing swelling or redness of the foot.  He does not have a history of gout that he can recall.  No recent injury. Denies any systemic complaints such as fevers, chills, nausea, vomiting. No acute changes since last appointment, and no other complaints at this time.   Objective: AAO x3, NAD DP/PT pulses palpable bilaterally, CRT less than 3 seconds There is moderate edema to the right foot compared to contra extremity and there is erythema mostly on the first MPJ and this is where he gets the majority of tenderness.  There is also tenderness along the ankle joint but to a lesser degree.  He is able to move the ankle joint although with some mild discomfort.  There is no open lesions identified.  There is no pain the lateral aspect the foot although there is swelling. No open lesions or pre-ulcerative lesions.  No pain with calf compression, swelling, warmth, erythema  Assessment: 68 year old male with right foot swelling, likely gout  Plan: -All treatment options discussed with the patient including all alternatives, risks, complications.  -X-rays were obtained and reviewed.  There was some erosive changes off the first metatarsal head consistent with gout changes but there is no definitive osteomyelitis identified or fracture. -Today he is requesting a steroid injection.  This was performed the first MPJ.  See procedure note below. -Prescribed colchicine. -I doubt infection given no open sores and the sudden onset.  He does have mild kidney reduction he states.  He has no open wounds.  There  is any worsening he needs to the emergency room. -Blood work ordered including CBC, uric acid, complete metabolic panel -Patient encouraged to call the office with any questions, concerns, change in symptoms.   Procedure: Injection small joint Discussed alternatives, risks, complications and verbal consent was obtained.  Location: Right first MPJ  Skin Prep: Betadine Injectate: 0.5cc 0.5% marcaine plain, 0.5 cc 2% lidocaine plain and, 1 cc kenalog 10. Disposition: Patient tolerated procedure well. Injection site dressed with a band-aid.  Post-injection care was discussed and return precautions discussed.   Return in about 1 week (around 08/13/2018).  Trula Slade DPM

## 2018-08-07 NOTE — Progress Notes (Signed)
I called patient to go over lab work. Uric acid level is high and want to do the colchicine but due to increase in WBC I sent doxycyline to the pharmacy. Left VM on his mobile. No answer on home number.

## 2018-08-12 ENCOUNTER — Encounter: Payer: Self-pay | Admitting: Podiatry

## 2018-08-12 ENCOUNTER — Ambulatory Visit (INDEPENDENT_AMBULATORY_CARE_PROVIDER_SITE_OTHER): Payer: PPO | Admitting: Podiatry

## 2018-08-12 DIAGNOSIS — M7751 Other enthesopathy of right foot: Secondary | ICD-10-CM | POA: Diagnosis not present

## 2018-08-12 MED ORDER — METHYLPREDNISOLONE 4 MG PO TBPK: ORAL_TABLET | ORAL | 0 refills | Status: DC

## 2018-08-12 NOTE — Progress Notes (Signed)
Presents today for follow-up of his foot and ankle states that is doing better after the injection that Dr. Carman Ching gave him but I think you need an injection in the ankle here.  Objective: Vital signs stable he is alert oriented x3.  Pulses are palpable.  He has swelling in the ankle much decrease in erythema of the first metatarsophalangeal joint mild erythema and tenderness on palpation of the medial ankle.  Inversion against resistance is intact though he does have tenderness at the talonavicular joint medially.  Mild erythema no cellulitis drainage odor.  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  Plan: Start him on a Medrol Dosepak and also injected the area with Kenalog 20 mg Kenalog 5 mg Marcaine point of maximal tenderness.  Tolerated procedure well.  He will wait to start the Medrol Dosepak for a couple of days to see if the injection helps.

## 2018-09-02 ENCOUNTER — Ambulatory Visit: Payer: PPO | Admitting: Podiatry

## 2018-09-30 ENCOUNTER — Telehealth: Payer: Self-pay | Admitting: Podiatry

## 2018-09-30 ENCOUNTER — Other Ambulatory Visit: Payer: Self-pay | Admitting: Podiatry

## 2018-09-30 DIAGNOSIS — M1000 Idiopathic gout, unspecified site: Secondary | ICD-10-CM

## 2018-09-30 MED ORDER — COLCHICINE 0.6 MG PO TABS: 0.6000 mg | ORAL_TABLET | Freq: Every day | ORAL | 0 refills | Status: DC

## 2018-09-30 NOTE — Progress Notes (Signed)
I called the

## 2018-09-30 NOTE — Telephone Encounter (Signed)
I called the patient. We will go back to the colchicine and sent to the pharmacy. Encouraged hydration. He thinks the gout is starting to come back. Some mild swelling and heat and it feels like it did before. No recent injury. Will recheck BMP and uric acid next week. Call with any changes.

## 2018-09-30 NOTE — Telephone Encounter (Signed)
Pt called stating that he believes he is having another gout flare up. Patient would like a call back from the nurse to discuss.

## 2018-09-30 NOTE — Telephone Encounter (Signed)
I spoke with patient, he stated that his toes on right foot are starting to hurt again and feel slightly hot to touch, which started yesterday.  He said he woke up with pain last night, but pain is not to bad today.  He is concerned about gout coming back.  He did tell me that he has a Medrol dose pack from last visit that was never filled and still had Colchicine.   Does he need to come back into office or can he go ahead and take the medrol dose pack?  He is aware that dose pack will raise his blood sugars.  Please advise.

## 2018-10-01 ENCOUNTER — Telehealth: Payer: Self-pay | Admitting: *Deleted

## 2018-10-01 DIAGNOSIS — M1000 Idiopathic gout, unspecified site: Secondary | ICD-10-CM

## 2018-10-01 DIAGNOSIS — M779 Enthesopathy, unspecified: Secondary | ICD-10-CM

## 2018-10-01 DIAGNOSIS — M778 Other enthesopathies, not elsewhere classified: Secondary | ICD-10-CM

## 2018-10-01 DIAGNOSIS — M7751 Other enthesopathy of right foot: Secondary | ICD-10-CM

## 2018-10-01 NOTE — Telephone Encounter (Signed)
-----   Message from Trula Slade, DPM sent at 09/30/2018  5:24 PM EDT ----- I wanted to order a uric acid and BMP for him and had to put it in as future for the order to go through. He is going to get it the end of next week. I didn't know if you had to redo it (I know you say they always call when it is put in as future)

## 2018-11-09 ENCOUNTER — Ambulatory Visit: Payer: PPO | Admitting: Podiatry

## 2018-11-09 ENCOUNTER — Ambulatory Visit (INDEPENDENT_AMBULATORY_CARE_PROVIDER_SITE_OTHER): Payer: PPO

## 2018-11-09 ENCOUNTER — Other Ambulatory Visit: Payer: Self-pay

## 2018-11-09 ENCOUNTER — Other Ambulatory Visit: Payer: Self-pay | Admitting: Podiatry

## 2018-11-09 ENCOUNTER — Encounter: Payer: Self-pay | Admitting: Podiatry

## 2018-11-09 VITALS — Temp 97.3°F

## 2018-11-09 DIAGNOSIS — S99912A Unspecified injury of left ankle, initial encounter: Secondary | ICD-10-CM | POA: Diagnosis not present

## 2018-11-09 DIAGNOSIS — M779 Enthesopathy, unspecified: Secondary | ICD-10-CM | POA: Diagnosis not present

## 2018-11-09 DIAGNOSIS — M19072 Primary osteoarthritis, left ankle and foot: Secondary | ICD-10-CM

## 2018-11-09 DIAGNOSIS — M778 Other enthesopathies, not elsewhere classified: Secondary | ICD-10-CM

## 2018-11-09 MED ORDER — TRIAMCINOLONE ACETONIDE 10 MG/ML IJ SUSP: 10.0000 mg | Freq: Once | INTRAMUSCULAR | Status: AC

## 2018-11-09 NOTE — Progress Notes (Signed)
Subjective: 68 year old male presents the office today for concerns of left ankle pain, lateral aspect.  He states about 10 years ago he had a surgery to the medial aspect and since then if he were to twist his foot slightly he gets pain and swelling to the lateral aspect.  He has previously seen Dr. Milinda Pointer for similar issue back in 2008 steroid injection was helpful.  He states that on Memorial Day he stepped in a small hole and twisted his foot. The next day he had pain, swelling to the outside. He states it is getting better but he is interested in a steroid injection today. Denies any systemic complaints such as fevers, chills, nausea, vomiting. No acute changes since last appointment, and no other complaints at this time.   Objective: AAO x3, NAD DP/PT pulses palpable bilaterally, CRT less than 3 seconds There is tenderness palpation on the lateral aspect the left foot on the sinus tarsi.  There is no area pinpoint tenderness to the ankle.  No pain along the flexor, extensor tendons otherwise.  Achilles tendon appears to be intact.  There is minimal swelling there is no erythema or warmth.  He states the right side is been doing well.  No open lesions or pre-ulcerative lesions.  No pain with calf compression, swelling, warmth, erythema  Assessment: Left subtalar joint capsulitis, uncertain  Plan: -All treatment options discussed with the patient including all alternatives, risks, complications.  -X-rays were obtained today.  There is no evidence of acute fracture.  Arthritic changes present of the foot and ankle particularly to the subtalar joint. -Steroid injection performed today.  See procedure note below. -Tri-Lock ankle brace dispensed for stability -Ice and elevation -Given kidney issues will hold off on anti-inflammatories; Tylenol as needed -Patient encouraged to call the office with any questions, concerns, change in symptoms.   Procedure: Injection intermediate joint Discussed  alternatives, risks, complications and verbal consent was obtained.  Location:  Skin Prep: Betaine Injectate: 0.5cc 0.5% marcaine plain, 0.5 cc 2% lidocaine plain and, 1 cc kenalog 10. Disposition: Patient tolerated procedure well. Injection site dressed with a band-aid.  Post-injection care was discussed and return precautions discussed.   Trula Slade DPM

## 2018-11-09 NOTE — Patient Instructions (Signed)
mws 

## 2018-11-30 ENCOUNTER — Ambulatory Visit: Payer: PPO | Admitting: Podiatry

## 2018-12-22 DIAGNOSIS — E1142 Type 2 diabetes mellitus with diabetic polyneuropathy: Secondary | ICD-10-CM | POA: Diagnosis not present

## 2018-12-22 DIAGNOSIS — Z794 Long term (current) use of insulin: Secondary | ICD-10-CM | POA: Diagnosis not present

## 2018-12-22 DIAGNOSIS — Z6839 Body mass index (BMI) 39.0-39.9, adult: Secondary | ICD-10-CM | POA: Diagnosis not present

## 2018-12-22 DIAGNOSIS — E669 Obesity, unspecified: Secondary | ICD-10-CM | POA: Diagnosis not present

## 2018-12-22 DIAGNOSIS — E1165 Type 2 diabetes mellitus with hyperglycemia: Secondary | ICD-10-CM | POA: Diagnosis not present

## 2019-01-17 DIAGNOSIS — Z794 Long term (current) use of insulin: Secondary | ICD-10-CM | POA: Diagnosis not present

## 2019-01-17 DIAGNOSIS — H524 Presbyopia: Secondary | ICD-10-CM | POA: Diagnosis not present

## 2019-01-17 DIAGNOSIS — H25099 Other age-related incipient cataract, unspecified eye: Secondary | ICD-10-CM | POA: Diagnosis not present

## 2019-01-17 DIAGNOSIS — H5203 Hypermetropia, bilateral: Secondary | ICD-10-CM | POA: Diagnosis not present

## 2019-01-17 DIAGNOSIS — E119 Type 2 diabetes mellitus without complications: Secondary | ICD-10-CM | POA: Diagnosis not present

## 2019-01-17 DIAGNOSIS — H52223 Regular astigmatism, bilateral: Secondary | ICD-10-CM | POA: Diagnosis not present

## 2019-02-07 DIAGNOSIS — Z6841 Body Mass Index (BMI) 40.0 and over, adult: Secondary | ICD-10-CM | POA: Diagnosis not present

## 2019-02-07 DIAGNOSIS — E1142 Type 2 diabetes mellitus with diabetic polyneuropathy: Secondary | ICD-10-CM | POA: Diagnosis not present

## 2019-02-07 DIAGNOSIS — E785 Hyperlipidemia, unspecified: Secondary | ICD-10-CM | POA: Diagnosis not present

## 2019-02-07 DIAGNOSIS — I10 Essential (primary) hypertension: Secondary | ICD-10-CM | POA: Diagnosis not present

## 2019-02-07 DIAGNOSIS — Z0000 Encounter for general adult medical examination without abnormal findings: Secondary | ICD-10-CM | POA: Diagnosis not present

## 2019-02-16 DIAGNOSIS — I10 Essential (primary) hypertension: Secondary | ICD-10-CM | POA: Diagnosis not present

## 2019-02-16 DIAGNOSIS — Z23 Encounter for immunization: Secondary | ICD-10-CM | POA: Diagnosis not present

## 2019-03-16 DIAGNOSIS — R351 Nocturia: Secondary | ICD-10-CM | POA: Diagnosis not present

## 2019-03-16 DIAGNOSIS — N5201 Erectile dysfunction due to arterial insufficiency: Secondary | ICD-10-CM | POA: Diagnosis not present

## 2019-03-16 DIAGNOSIS — N400 Enlarged prostate without lower urinary tract symptoms: Secondary | ICD-10-CM | POA: Diagnosis not present

## 2019-03-23 DIAGNOSIS — G4733 Obstructive sleep apnea (adult) (pediatric): Secondary | ICD-10-CM | POA: Diagnosis not present

## 2019-03-24 ENCOUNTER — Ambulatory Visit: Payer: PPO | Admitting: Podiatry

## 2019-05-11 DIAGNOSIS — Z85828 Personal history of other malignant neoplasm of skin: Secondary | ICD-10-CM | POA: Diagnosis not present

## 2019-05-11 DIAGNOSIS — D044 Carcinoma in situ of skin of scalp and neck: Secondary | ICD-10-CM | POA: Diagnosis not present

## 2019-05-11 DIAGNOSIS — L570 Actinic keratosis: Secondary | ICD-10-CM | POA: Diagnosis not present

## 2019-05-11 DIAGNOSIS — D225 Melanocytic nevi of trunk: Secondary | ICD-10-CM | POA: Diagnosis not present

## 2019-05-19 DIAGNOSIS — D044 Carcinoma in situ of skin of scalp and neck: Secondary | ICD-10-CM | POA: Diagnosis not present

## 2019-05-19 DIAGNOSIS — Z85828 Personal history of other malignant neoplasm of skin: Secondary | ICD-10-CM | POA: Diagnosis not present

## 2019-07-01 ENCOUNTER — Encounter: Payer: Self-pay | Admitting: Podiatry

## 2019-07-01 ENCOUNTER — Other Ambulatory Visit: Payer: Self-pay

## 2019-07-01 ENCOUNTER — Ambulatory Visit: Payer: PPO | Admitting: Podiatry

## 2019-07-01 ENCOUNTER — Ambulatory Visit (INDEPENDENT_AMBULATORY_CARE_PROVIDER_SITE_OTHER): Payer: PPO

## 2019-07-01 DIAGNOSIS — M109 Gout, unspecified: Secondary | ICD-10-CM

## 2019-07-01 DIAGNOSIS — M779 Enthesopathy, unspecified: Secondary | ICD-10-CM

## 2019-07-01 DIAGNOSIS — M1000 Idiopathic gout, unspecified site: Secondary | ICD-10-CM

## 2019-07-01 LAB — COMPLETE METABOLIC PANEL WITH GFR
AG Ratio: 1.4 (calc) (ref 1.0–2.5)
ALT: 13 U/L (ref 9–46)
AST: 17 U/L (ref 10–35)
Albumin: 4.2 g/dL (ref 3.6–5.1)
Alkaline phosphatase (APISO): 65 U/L (ref 35–144)
BUN/Creatinine Ratio: 15 (calc) (ref 6–22)
BUN: 23 mg/dL (ref 7–25)
CO2: 32 mmol/L (ref 20–32)
Calcium: 9.8 mg/dL (ref 8.6–10.3)
Chloride: 100 mmol/L (ref 98–110)
Creat: 1.52 mg/dL — ABNORMAL HIGH (ref 0.70–1.18)
GFR, Est African American: 53 mL/min/{1.73_m2} — ABNORMAL LOW (ref >=60)
GFR, Est Non African American: 46 mL/min/{1.73_m2} — ABNORMAL LOW (ref >=60)
Globulin: 3 g/dL (calc) (ref 1.9–3.7)
Glucose, Bld: 116 mg/dL — ABNORMAL HIGH (ref 65–99)
Potassium: 5.3 mmol/L (ref 3.5–5.3)
Sodium: 141 mmol/L (ref 135–146)
Total Bilirubin: 0.4 mg/dL (ref 0.2–1.2)
Total Protein: 7.2 g/dL (ref 6.1–8.1)

## 2019-07-01 LAB — CBC WITH DIFFERENTIAL/PLATELET
Absolute Monocytes: 971 cells/uL — ABNORMAL HIGH (ref 200–950)
Basophils Absolute: 105 cells/uL (ref 0–200)
Basophils Relative: 0.9 %
Eosinophils Absolute: 339 cells/uL (ref 15–500)
Eosinophils Relative: 2.9 %
HCT: 44.8 % (ref 38.5–50.0)
Hemoglobin: 15.2 g/dL (ref 13.2–17.1)
Lymphs Abs: 3136 cells/uL (ref 850–3900)
MCH: 29.5 pg (ref 27.0–33.0)
MCHC: 33.9 g/dL (ref 32.0–36.0)
MCV: 87 fL (ref 80.0–100.0)
MPV: 9.6 fL (ref 7.5–12.5)
Monocytes Relative: 8.3 %
Neutro Abs: 7149 cells/uL (ref 1500–7800)
Neutrophils Relative %: 61.1 %
Platelets: 296 10*3/uL (ref 140–400)
RBC: 5.15 10*6/uL (ref 4.20–5.80)
RDW: 13 % (ref 11.0–15.0)
Total Lymphocyte: 26.8 %
WBC: 11.7 10*3/uL — ABNORMAL HIGH (ref 3.8–10.8)

## 2019-07-01 LAB — URIC ACID: Uric Acid, Serum: 9.1 mg/dL — ABNORMAL HIGH (ref 4.0–8.0)

## 2019-07-01 NOTE — Progress Notes (Signed)
Subjective: 69 year old male presents the office today for concerns of pain to his right foot.  He states that he gets pain to the side of his foot as he points on the first MPJ going for the top.  He denies any recent injury.  He has had swelling as well as redness.  It came on suddenly and has gotten somewhat better over the last week. Denies any systemic complaints such as fevers, chills, nausea, vomiting. No acute changes since last appointment, and no other complaints at this time.   Objective: AAO x3, NAD DP/PT pulses palpable bilaterally, CRT less than 3 seconds Tenderness directly on the first MPJ medially there is localized edema and erythema to this area.  Mild edema to the medial aspect of foot there is no open lesions.  There is no fluctuance or crepitus or other signs of infection.  No open lesions or pre-ulcerative lesions.  No pain with calf compression, swelling, warmth, erythema  Assessment: Capsulitis, gout  Plan: -All treatment options discussed with the patient including all alternatives, risks, complications.  -X-rays were obtained and reviewed with the patient. Vessel calcification present. Mild arthritic changes. No evidence of acute fracture. -Steroid injection performed.  See procedure note below. -Blood work ordered. -Patient encouraged to call the office with any questions, concerns, change in symptoms.   Procedure: Injection small joint Discussed alternatives, risks, complications and verbal consent was obtained.  Location: Right first MPJ and medial to the joint Skin Prep: Betadine. Injectate: 0.5cc 0.5% marcaine plain, 0.5 cc 2% lidocaine plain and, 1 cc kenalog 10. Disposition: Patient tolerated procedure well. Injection site dressed with a band-aid.  Post-injection care was discussed and return precautions discussed.

## 2019-07-01 NOTE — Patient Instructions (Signed)
 Gout  Gout is painful swelling of your joints. Gout is a type of arthritis. It is caused by having too much uric acid in your body. Uric acid is a chemical that is made when your body breaks down substances called purines. If your body has too much uric acid, sharp crystals can form and build up in your joints. This causes pain and swelling. Gout attacks can happen quickly and be very painful (acute gout). Over time, the attacks can affect more joints and happen more often (chronic gout). What are the causes?  Too much uric acid in your blood. This can happen because: ? Your kidneys do not remove enough uric acid from your blood. ? Your body makes too much uric acid. ? You eat too many foods that are high in purines. These foods include organ meats, some seafood, and beer.  Trauma or stress. What increases the risk?  Having a family history of gout.  Being male and middle-aged.  Being male and having gone through menopause.  Being very overweight (obese).  Drinking alcohol, especially beer.  Not having enough water in the body (being dehydrated).  Losing weight too quickly.  Having an organ transplant.  Having lead poisoning.  Taking certain medicines.  Having kidney disease.  Having a skin condition called psoriasis. What are the signs or symptoms? An attack of acute gout usually happens in just one joint. The most common place is the big toe. Attacks often start at night. Other joints that may be affected include joints of the feet, ankle, knee, fingers, wrist, or elbow. Symptoms of an attack may include:  Very bad pain.  Warmth.  Swelling.  Stiffness.  Shiny, red, or purple skin.  Tenderness. The affected joint may be very painful to touch.  Chills and fever. Chronic gout may cause symptoms more often. More joints may be involved. You may also have white or yellow lumps (tophi) on your hands or feet or in other areas near your joints. How is this  treated?  Treatment for this condition has two phases: treating an acute attack and preventing future attacks.  Acute gout treatment may include: ? NSAIDs. ? Steroids. These are taken by mouth or injected into a joint. ? Colchicine. This medicine relieves pain and swelling. It can be given by mouth or through an IV tube.  Preventive treatment may include: ? Taking small doses of NSAIDs or colchicine daily. ? Using a medicine that reduces uric acid levels in your blood. ? Making changes to your diet. You may need to see a food expert (dietitian) about what to eat and drink to prevent gout. Follow these instructions at home: During a gout attack   If told, put ice on the painful area: ? Put ice in a plastic bag. ? Place a towel between your skin and the bag. ? Leave the ice on for 20 minutes, 2-3 times a day.  Raise (elevate) the painful joint above the level of your heart as often as you can.  Rest the joint as much as possible. If the joint is in your leg, you may be given crutches.  Follow instructions from your doctor about what you cannot eat or drink. Avoiding future gout attacks  Eat a low-purine diet. Avoid foods and drinks such as: ? Liver. ? Kidney. ? Anchovies. ? Asparagus. ? Herring. ? Mushrooms. ? Mussels. ? Beer.  Stay at a healthy weight. If you want to lose weight, talk with your doctor. Do not lose   weight too fast.  Start or continue an exercise plan as told by your doctor. Eating and drinking  Drink enough fluids to keep your pee (urine) pale yellow.  If you drink alcohol: ? Limit how much you use to:  0-1 drink a day for women.  0-2 drinks a day for men. ? Be aware of how much alcohol is in your drink. In the [place] one drink equals one 12 oz bottle of beer (355 mL), one 5 oz glass of wine (148 mL), or one 1 oz glass of hard liquor (44 mL). General instructions  Take over-the-counter and prescription medicines only as told by your doctor.  Do  not drive or use heavy machinery while taking prescription pain medicine.  Return to your normal activities as told by your doctor. Ask your doctor what activities are safe for you.  Keep all follow-up visits as told by your doctor. This is important. Contact a doctor if:  You have another gout attack.  You still have symptoms of a gout attack after 10 days of treatment.  You have problems (side effects) because of your medicines.  You have chills or a fever.  You have burning pain when you pee (urinate).  You have pain in your lower back or belly. Get help right away if:  You have very bad pain.  Your pain cannot be controlled.  You cannot pee. Summary  Gout is painful swelling of the joints.  The most common site of pain is the big toe, but it can affect other joints.  Medicines and avoiding some foods can help to prevent and treat gout attacks. This information is not intended to replace advice given to you by your health care provider. Make sure you discuss any questions you have with your health care provider. Document Revised: 12/16/2017 Document Reviewed: 12/16/2017 Elsevier Patient Education  2020 Elsevier Inc.  

## 2019-07-05 ENCOUNTER — Telehealth: Payer: Self-pay | Admitting: *Deleted

## 2019-07-05 NOTE — Telephone Encounter (Signed)
Pt states MyChart notification that the labs are available but he can not see.

## 2019-07-06 MED ORDER — ALLOPURINOL 100 MG PO TABS: 100.0000 mg | ORAL_TABLET | Freq: Every day | ORAL | 6 refills | Status: DC

## 2019-07-06 NOTE — Telephone Encounter (Signed)
Left message on pt's mobile for pt, that Dr. Jacqualyn Posey had left message on MyChart with explanation of results and recommendation, but he wanted to know how pt was doing.

## 2019-07-06 NOTE — Telephone Encounter (Signed)
Val- this is what I sen [facility] through [employer] Can you call him to follow up? Thanks.   The uric acid level is high, at 9.1. I think it would be best since you are getting repeated gout flares to possibly start a medication called allopurinol to help lower the uric acid levels to help prevent gout flares.    How are you feeling?

## 2019-07-06 NOTE — Telephone Encounter (Signed)
I spoke with pt and informed pt of Dr. Leigh Aurora review of results and recommendation of Allopurinol. Orders for allopurinol to [id number].

## 2019-07-13 ENCOUNTER — Other Ambulatory Visit: Payer: Self-pay | Admitting: Podiatry

## 2019-07-13 DIAGNOSIS — M779 Enthesopathy, unspecified: Secondary | ICD-10-CM

## 2019-07-13 DIAGNOSIS — M109 Gout, unspecified: Secondary | ICD-10-CM

## 2019-07-29 ENCOUNTER — Ambulatory Visit: Payer: PPO | Admitting: Podiatry

## 2019-07-30 ENCOUNTER — Ambulatory Visit: Payer: PPO | Attending: Internal Medicine

## 2019-07-30 DIAGNOSIS — Z23 Encounter for immunization: Secondary | ICD-10-CM | POA: Insufficient documentation

## 2019-07-30 NOTE — Progress Notes (Signed)
   Covid-19 Vaccination Clinic  Name:  Steven Mcclure    MRN: [id number] DOB: Oct 29, 1950  07/30/2019  [name] was observed post Covid-19 immunization for 15 minutes without incidence. He was provided with Vaccine Information Sheet and instruction to access the V-Safe system.   [name] was instructed to call 911 with any severe reactions post vaccine: Marland Kitchen Difficulty breathing  . Swelling of your face and throat  . A fast heartbeat  . A bad rash all over your body  . Dizziness and weakness    Immunizations Administered    Name Date Dose VIS Date Route   Pfizer COVID-19 Vaccine 07/30/2019  8:18 AM 0.3 mL 05/20/2019 Intramuscular   Manufacturer: [place]   Lot: [id number]   [place]: [id number]

## 2019-08-02 ENCOUNTER — Other Ambulatory Visit: Payer: Self-pay

## 2019-08-02 ENCOUNTER — Other Ambulatory Visit: Payer: Self-pay | Admitting: Podiatry

## 2019-08-02 ENCOUNTER — Ambulatory Visit: Payer: PPO | Admitting: Podiatry

## 2019-08-02 ENCOUNTER — Ambulatory Visit (INDEPENDENT_AMBULATORY_CARE_PROVIDER_SITE_OTHER): Payer: PPO

## 2019-08-02 ENCOUNTER — Encounter: Payer: Self-pay | Admitting: Podiatry

## 2019-08-02 DIAGNOSIS — M778 Other enthesopathies, not elsewhere classified: Secondary | ICD-10-CM

## 2019-08-02 DIAGNOSIS — M7751 Other enthesopathy of right foot: Secondary | ICD-10-CM

## 2019-08-02 DIAGNOSIS — M109 Gout, unspecified: Secondary | ICD-10-CM

## 2019-08-02 DIAGNOSIS — Z79899 Other long term (current) drug therapy: Secondary | ICD-10-CM

## 2019-08-02 MED ORDER — INDOMETHACIN 50 MG PO CAPS: 50.0000 mg | ORAL_CAPSULE | Freq: Two times a day (BID) | ORAL | 3 refills | Status: DC

## 2019-08-02 NOTE — Progress Notes (Signed)
He presents today for follow-up of capsulitis to his right foot.  States that he has been doing fine with allopurinol after the injections Dr. Jacqualyn Posey gave him last visit.  He states that he did go ahead and take the [employer].  He states that he just had this flareup starting last Thursday.  Objective: Vital signs are stable alert oriented x3.  Pulses are palpable.  Swollen right foot with pain on palpation of the sinus tarsi right and some medial malleolus pain.  Overlying mild edema overlying mild erythema in those areas.  Sinus tarsi is tender on palpation and range of motion is painful.  Assessment: Gouty arthritis subtalar joint right.  Plan: Discussed etiology pathology conservative therapies at this point we are going to draw a uric acid level a CBC and a CMP to make sure that his uric acid level is where he supposed to be with the allopurinol.  We may need to increase the allopurinol.  I did give him an injection today 20 mg Kenalog 5 mg Marcaine point maximal tenderness sinus tarsi right foot.  He tolerated the procedure well without complications.  Also wrote a prescription for indomethacin 50 mg 1 p.o. twice daily with food.  Follow-up with him in 1 month we will notify him as to the results and how to adjust the allopurinol.

## 2019-08-03 LAB — COMPLETE METABOLIC PANEL WITH GFR
AG Ratio: 1.2 (calc) (ref 1.0–2.5)
ALT: 12 U/L (ref 9–46)
AST: 16 U/L (ref 10–35)
Albumin: 3.9 g/dL (ref 3.6–5.1)
Alkaline phosphatase (APISO): 62 U/L (ref 35–144)
BUN/Creatinine Ratio: 21 (calc) (ref 6–22)
BUN: 34 mg/dL — ABNORMAL HIGH (ref 7–25)
CO2: 29 mmol/L (ref 20–32)
Calcium: 9.4 mg/dL (ref 8.6–10.3)
Chloride: 101 mmol/L (ref 98–110)
Creat: 1.61 mg/dL — ABNORMAL HIGH (ref 0.70–1.18)
GFR, Est African American: 49 mL/min/{1.73_m2} — ABNORMAL LOW (ref >=60)
GFR, Est Non African American: 43 mL/min/{1.73_m2} — ABNORMAL LOW (ref >=60)
Globulin: 3.2 g/dL (calc) (ref 1.9–3.7)
Glucose, Bld: 110 mg/dL — ABNORMAL HIGH (ref 65–99)
Potassium: 5 mmol/L (ref 3.5–5.3)
Sodium: 140 mmol/L (ref 135–146)
Total Bilirubin: 0.4 mg/dL (ref 0.2–1.2)
Total Protein: 7.1 g/dL (ref 6.1–8.1)

## 2019-08-03 LAB — CBC WITH DIFFERENTIAL/PLATELET
Absolute Monocytes: 1092 cells/uL — ABNORMAL HIGH (ref 200–950)
Basophils Absolute: 114 cells/uL (ref 0–200)
Basophils Relative: 0.9 %
Eosinophils Absolute: 470 cells/uL (ref 15–500)
Eosinophils Relative: 3.7 %
HCT: 42.3 % (ref 38.5–50.0)
Hemoglobin: 14.1 g/dL (ref 13.2–17.1)
Lymphs Abs: 3264 cells/uL (ref 850–3900)
MCH: 29.1 pg (ref 27.0–33.0)
MCHC: 33.3 g/dL (ref 32.0–36.0)
MCV: 87.2 fL (ref 80.0–100.0)
MPV: 9.2 fL (ref 7.5–12.5)
Monocytes Relative: 8.6 %
Neutro Abs: 7760 cells/uL (ref 1500–7800)
Neutrophils Relative %: 61.1 %
Platelets: 354 10*3/uL (ref 140–400)
RBC: 4.85 10*6/uL (ref 4.20–5.80)
RDW: 12.9 % (ref 11.0–15.0)
Total Lymphocyte: 25.7 %
WBC: 12.7 10*3/uL — ABNORMAL HIGH (ref 3.8–10.8)

## 2019-08-03 LAB — URIC ACID: Uric Acid, Serum: 7.9 mg/dL (ref 4.0–8.0)

## 2019-08-05 ENCOUNTER — Telehealth: Payer: Self-pay | Admitting: *Deleted

## 2019-08-05 DIAGNOSIS — E1142 Type 2 diabetes mellitus with diabetic polyneuropathy: Secondary | ICD-10-CM | POA: Diagnosis not present

## 2019-08-05 DIAGNOSIS — E669 Obesity, unspecified: Secondary | ICD-10-CM | POA: Diagnosis not present

## 2019-08-05 DIAGNOSIS — M109 Gout, unspecified: Secondary | ICD-10-CM | POA: Diagnosis not present

## 2019-08-05 DIAGNOSIS — Z7189 Other specified counseling: Secondary | ICD-10-CM | POA: Diagnosis not present

## 2019-08-05 DIAGNOSIS — E1165 Type 2 diabetes mellitus with hyperglycemia: Secondary | ICD-10-CM | POA: Diagnosis not present

## 2019-08-05 DIAGNOSIS — I10 Essential (primary) hypertension: Secondary | ICD-10-CM | POA: Diagnosis not present

## 2019-08-05 DIAGNOSIS — Z794 Long term (current) use of insulin: Secondary | ICD-10-CM | POA: Diagnosis not present

## 2019-08-05 MED ORDER — CEPHALEXIN 500 MG PO CAPS: 500.0000 mg | ORAL_CAPSULE | Freq: Four times a day (QID) | ORAL | 0 refills | Status: DC

## 2019-08-05 MED ORDER — ALLOPURINOL 100 MG PO TABS: 100.0000 mg | ORAL_TABLET | Freq: Two times a day (BID) | ORAL | 3 refills | Status: DC

## 2019-08-05 NOTE — Telephone Encounter (Signed)
Pt states his girlfriend is a triage nurse and will quiz him on his results and asked his WBC level. I informed pt of WBC 12.7 with [facility]'s high normal 10.8. Pt states understanding.

## 2019-08-05 NOTE — Telephone Encounter (Signed)
I informed pt of Dr. Stephenie Acres 08/03/2019 orders.

## 2019-08-05 NOTE — Telephone Encounter (Signed)
Faxed copy of 08/02/2019 labs to Dr. Sela Hilding.

## 2019-08-05 NOTE — Telephone Encounter (Signed)
-----   Message from Garrel Ridgel, [place] sent at 08/03/2019  7:11 AM EST ----- You can let him know that his uric acid is looking better but we are going to increase his allopurinol from 100 mg daily to 100 mg twice a day.  Please go ahead and prescribe this for him reading allopurinol 100 mg 1 p.o. twice daily dispense 180 with 3 refills.His white count is slightly elevated and appears to be somewhat chronic for him.  I would like to go ahead and at least cover him by getting him started on Keflex 500 mg 1 p.o. 3 times daily x7 days.Please forward these results to his primary care provider.I would like to see the patient in about 2 weeks.

## 2019-08-22 ENCOUNTER — Ambulatory Visit: Payer: PPO | Attending: Internal Medicine

## 2019-08-22 DIAGNOSIS — Z23 Encounter for immunization: Secondary | ICD-10-CM

## 2019-08-22 NOTE — Progress Notes (Signed)
   Covid-19 Vaccination Clinic  Name:  Steven Mcclure    MRN: [id number] DOB: 08/08/1950  08/22/2019  [name] was observed post Covid-19 immunization for 15 minutes without incident. He was provided with Vaccine Information Sheet and instruction to access the V-Safe system.   [name] was instructed to call 911 with any severe reactions post vaccine: Marland Kitchen Difficulty breathing  . Swelling of face and throat  . A fast heartbeat  . A bad rash all over body  . Dizziness and weakness   Immunizations Administered    Name Date Dose VIS Date Route   Pfizer COVID-19 Vaccine 08/22/2019  8:21 AM 0.3 mL 05/20/2019 Intramuscular   Manufacturer: [place]   Lot: [id number]   [place]: [id number]

## 2019-09-01 ENCOUNTER — Ambulatory Visit: Payer: PPO | Admitting: Podiatry

## 2019-11-30 DIAGNOSIS — D2371 Other benign neoplasm of skin of right lower limb, including hip: Secondary | ICD-10-CM | POA: Diagnosis not present

## 2019-11-30 DIAGNOSIS — D1801 Hemangioma of skin and subcutaneous tissue: Secondary | ICD-10-CM | POA: Diagnosis not present

## 2019-11-30 DIAGNOSIS — D485 Neoplasm of uncertain behavior of skin: Secondary | ICD-10-CM | POA: Diagnosis not present

## 2019-11-30 DIAGNOSIS — Z85828 Personal history of other malignant neoplasm of skin: Secondary | ICD-10-CM | POA: Diagnosis not present

## 2019-11-30 DIAGNOSIS — L821 Other seborrheic keratosis: Secondary | ICD-10-CM | POA: Diagnosis not present

## 2019-11-30 DIAGNOSIS — D225 Melanocytic nevi of trunk: Secondary | ICD-10-CM | POA: Diagnosis not present

## 2019-11-30 DIAGNOSIS — D2262 Melanocytic nevi of left upper limb, including shoulder: Secondary | ICD-10-CM | POA: Diagnosis not present

## 2019-11-30 DIAGNOSIS — L570 Actinic keratosis: Secondary | ICD-10-CM | POA: Diagnosis not present

## 2020-01-30 DIAGNOSIS — Z794 Long term (current) use of insulin: Secondary | ICD-10-CM | POA: Diagnosis not present

## 2020-01-30 DIAGNOSIS — H52223 Regular astigmatism, bilateral: Secondary | ICD-10-CM | POA: Diagnosis not present

## 2020-01-30 DIAGNOSIS — H25099 Other age-related incipient cataract, unspecified eye: Secondary | ICD-10-CM | POA: Diagnosis not present

## 2020-01-30 DIAGNOSIS — H524 Presbyopia: Secondary | ICD-10-CM | POA: Diagnosis not present

## 2020-01-30 DIAGNOSIS — E119 Type 2 diabetes mellitus without complications: Secondary | ICD-10-CM | POA: Diagnosis not present

## 2020-01-30 DIAGNOSIS — H5203 Hypermetropia, bilateral: Secondary | ICD-10-CM | POA: Diagnosis not present

## 2020-02-02 DIAGNOSIS — N1832 Chronic kidney disease, stage 3b: Secondary | ICD-10-CM | POA: Diagnosis not present

## 2020-02-02 DIAGNOSIS — I10 Essential (primary) hypertension: Secondary | ICD-10-CM | POA: Diagnosis not present

## 2020-02-02 DIAGNOSIS — E669 Obesity, unspecified: Secondary | ICD-10-CM | POA: Diagnosis not present

## 2020-02-02 DIAGNOSIS — Z794 Long term (current) use of insulin: Secondary | ICD-10-CM | POA: Diagnosis not present

## 2020-02-02 DIAGNOSIS — E1142 Type 2 diabetes mellitus with diabetic polyneuropathy: Secondary | ICD-10-CM | POA: Diagnosis not present

## 2020-02-02 DIAGNOSIS — E1165 Type 2 diabetes mellitus with hyperglycemia: Secondary | ICD-10-CM | POA: Diagnosis not present

## 2020-02-15 DIAGNOSIS — N183 Chronic kidney disease, stage 3 unspecified: Secondary | ICD-10-CM | POA: Diagnosis not present

## 2020-02-21 DIAGNOSIS — I129 Hypertensive chronic kidney disease with stage 1 through stage 4 chronic kidney disease, or unspecified chronic kidney disease: Secondary | ICD-10-CM | POA: Diagnosis not present

## 2020-02-21 DIAGNOSIS — N183 Chronic kidney disease, stage 3 unspecified: Secondary | ICD-10-CM | POA: Diagnosis not present

## 2020-03-16 ENCOUNTER — Other Ambulatory Visit: Payer: Self-pay | Admitting: Podiatry

## 2020-04-03 DIAGNOSIS — G4733 Obstructive sleep apnea (adult) (pediatric): Secondary | ICD-10-CM | POA: Diagnosis not present

## 2020-05-07 DIAGNOSIS — J069 Acute upper respiratory infection, unspecified: Secondary | ICD-10-CM | POA: Diagnosis not present

## 2020-05-08 DIAGNOSIS — J069 Acute upper respiratory infection, unspecified: Secondary | ICD-10-CM | POA: Diagnosis not present

## 2020-05-08 DIAGNOSIS — Z20822 Contact with and (suspected) exposure to covid-19: Secondary | ICD-10-CM | POA: Diagnosis not present

## 2020-05-11 ENCOUNTER — Other Ambulatory Visit: Payer: Self-pay

## 2020-05-11 NOTE — Patient Outreach (Signed)
  [facility]) Care Management Chronic Special Needs Program    05/11/2020  Name: Steven Mcclure, DOB: March 20, 1951  MRN: [id number]   [name] is enrolled in a chronic special needs plan. Telephone call to client to complete health risk assessment and for initial telephone outreach.  Unable to reach.  HIPAA compliant voice message left with call back phone number and return call request.   PLAN; RNCM will attempt 2nd telephone call to client in 2 weeks.   Quinn Plowman RN,BSN,CCM [facility] [phone]

## 2020-05-14 ENCOUNTER — Other Ambulatory Visit: Payer: Self-pay

## 2020-05-14 NOTE — Patient Outreach (Signed)
  [facility]) Care Management Chronic Special Needs Program    05/14/2020  Name: Steven Mcclure, DOB: 1950-07-10  MRN: [id number]   [name] is enrolled in a chronic special needs plan.  Second telephone call to client for Health risk assessment completion and initial assessment outreach. Unable to reach. HIPAA compliant voice message left with call back phone number and return call request.   PLAN; RNCM will attempt 3rd telephone call to client in 2 weeks.   Quinn Plowman RN,BSN,CCM [facility] [phone]

## 2020-05-21 ENCOUNTER — Other Ambulatory Visit: Payer: Self-pay

## 2020-05-21 DIAGNOSIS — E119 Type 2 diabetes mellitus without complications: Secondary | ICD-10-CM | POA: Insufficient documentation

## 2020-05-21 DIAGNOSIS — I10 Essential (primary) hypertension: Secondary | ICD-10-CM | POA: Insufficient documentation

## 2020-05-21 NOTE — Patient Outreach (Signed)
[facility]) Care Management Chronic Special Needs Program  05/21/2020  Name: Steven Mcclure DOB: April 20, 1951  MRN: [id number]  Steven Mcclure is enrolled in a chronic special needs plan for Diabetes. Chronic Care Management Coordinator telephoned client to review health risk assessment and to develop individualized care plan.  HIPAA verified with client.   Introduced the chronic care management program, importance of client participation, and taking their care plan to all provider appointments and inpatient facilities.   Client states he is doing well. Client states he continues to work full time. He report following up with his doctors regularly and states he has transportation to his appointments. Client states he takes his medications as prescribes and can afford them.  Client states he checks his blood sugars daily.  Reports his blood sugars range from 90 to 190's.  Goals Addressed            This Visit's Progress   . "I would like to lose weight"       RN case manager will send client education article:  Weight loss tips and heart healthy diet.  Contact your RN case manager if you needs assistance with weight management. A referral can be made to the [place] coach.  Increase exercise activity     . Client understands the importance of follow-up with providers by attending scheduled visits       Primary care provider visit:  Client reports having tele health visit approximately 1 1/2 week ago Endocrinology visit:  02/02/20 Podiatry visit 08/05/19 Nephrology visit 02/21/20 Continue to follow up with your doctor as recommended.     . Client will verbalize knowledge of self management of Hypertension as evidences by BP reading of 140/90 or less; or as defined by provider       Take your blood pressure medication as prescribed.  Plan to eat  low salt and heart healthy meals full of fruits, vegetables, whole grains, lean protein, and limit fats and  sugars Increase activity as tolerated. RN case manager will send education articles: High blood pressure in adults, and Low salt diet.      Marland Kitchen HEMOGLOBIN A1C < 7       Discussed diabetes self management actions:  Glucose monitoring per provider recommendation  Check feet daily  Visit provider every 3-6 months as directed  Hbg A1C level every 3-6 months.  Eye Exam yearly  Carbohydrate controlled meal planning  Taking diabetes medication as prescribed by provider  Physical activity     . Maintain timely refills of diabetic medication as prescribed within the year .       Client reports maintaining timely refills of his medications Contact your RN case manager if you are unable to obtain your medications.     . COMPLETED: Obtain annual  Lipid Profile, LDL-C       Annual Lipid profile completed 08/05/19    . COMPLETED: Obtain Annual Eye (retinal)  Exam        Annual eye exam 01/30/20    . COMPLETED: Obtain Annual Foot Exam       Annual foot exam:  02/02/20    . COMPLETED: Obtain annual screen for micro albuminuria (urine) , nephropathy (kidney problems)       Annual micro albuminuria screening completed: 12/22/18    . Obtain Hemoglobin A1C at least 2 times per year       Hgb A1c completed 02/02/20 Follow up with your doctor regularly and have lab  work completed as recommended.     . Visit Primary Care Provider or Endocrinologist at least 2 times per year          Plan:  Send successful outreach letter with a copy of their individualized care plan, Send individual care plan to provider and Send educational material  Chronic care management coordination will outreach in: 12 months   Quinn Plowman RN,BSN,CCM [facility] [phone]

## 2020-05-25 ENCOUNTER — Other Ambulatory Visit: Payer: Self-pay

## 2020-05-25 NOTE — Patient Outreach (Signed)
  [facility]) Care Management Chronic Special Needs Program    05/25/2020  Name: Steven Mcclure, DOB: [birth year]  MRN: [id number]   Steven Mcclure is enrolled in a chronic special needs plan for Diabetes. [facility] will continue to provide services for this member through 06/08/2020. The [facility] Care Management Team will assume care 06/09/2020.  Thea Silversmith, RN, MSN, [place] [phone]

## 2020-06-12 ENCOUNTER — Other Ambulatory Visit: Payer: Self-pay

## 2020-08-14 DIAGNOSIS — E1142 Type 2 diabetes mellitus with diabetic polyneuropathy: Secondary | ICD-10-CM | POA: Diagnosis not present

## 2020-08-14 DIAGNOSIS — I10 Essential (primary) hypertension: Secondary | ICD-10-CM | POA: Diagnosis not present

## 2020-08-14 DIAGNOSIS — E785 Hyperlipidemia, unspecified: Secondary | ICD-10-CM | POA: Diagnosis not present

## 2020-08-14 DIAGNOSIS — Z0000 Encounter for general adult medical examination without abnormal findings: Secondary | ICD-10-CM | POA: Diagnosis not present

## 2020-08-14 DIAGNOSIS — Z6841 Body Mass Index (BMI) 40.0 and over, adult: Secondary | ICD-10-CM | POA: Diagnosis not present

## 2020-08-14 DIAGNOSIS — Z9189 Other specified personal risk factors, not elsewhere classified: Secondary | ICD-10-CM | POA: Diagnosis not present

## 2020-08-24 DIAGNOSIS — Z794 Long term (current) use of insulin: Secondary | ICD-10-CM | POA: Diagnosis not present

## 2020-08-24 DIAGNOSIS — N1832 Chronic kidney disease, stage 3b: Secondary | ICD-10-CM | POA: Diagnosis not present

## 2020-08-24 DIAGNOSIS — E1142 Type 2 diabetes mellitus with diabetic polyneuropathy: Secondary | ICD-10-CM | POA: Diagnosis not present

## 2020-08-24 DIAGNOSIS — I10 Essential (primary) hypertension: Secondary | ICD-10-CM | POA: Diagnosis not present

## 2020-08-24 DIAGNOSIS — E669 Obesity, unspecified: Secondary | ICD-10-CM | POA: Diagnosis not present

## 2020-09-07 DIAGNOSIS — N5201 Erectile dysfunction due to arterial insufficiency: Secondary | ICD-10-CM | POA: Diagnosis not present

## 2020-09-07 DIAGNOSIS — N400 Enlarged prostate without lower urinary tract symptoms: Secondary | ICD-10-CM | POA: Diagnosis not present

## 2020-09-26 ENCOUNTER — Other Ambulatory Visit: Payer: Self-pay | Admitting: Podiatry

## 2020-09-26 NOTE — Telephone Encounter (Signed)
Please advise 

## 2020-10-10 ENCOUNTER — Other Ambulatory Visit (HOSPITAL_COMMUNITY): Payer: Self-pay | Admitting: Family Medicine

## 2020-10-12 ENCOUNTER — Other Ambulatory Visit: Payer: Self-pay

## 2020-10-12 ENCOUNTER — Ambulatory Visit (HOSPITAL_COMMUNITY)
Admission: RE | Admit: 2020-10-12 | Discharge: 2020-10-12 | Disposition: A | Discharge status: Home / Self Care | Payer: Self-pay | Source: Ambulatory Visit | Attending: Family Medicine | Admitting: Family Medicine

## 2020-10-12 DIAGNOSIS — Z9189 Other specified personal risk factors, not elsewhere classified: Secondary | ICD-10-CM | POA: Insufficient documentation

## 2020-11-12 ENCOUNTER — Ambulatory Visit (INDEPENDENT_AMBULATORY_CARE_PROVIDER_SITE_OTHER): Payer: HMO

## 2020-11-12 ENCOUNTER — Ambulatory Visit: Payer: HMO | Admitting: Podiatry

## 2020-11-12 ENCOUNTER — Other Ambulatory Visit: Payer: Self-pay

## 2020-11-12 DIAGNOSIS — M10072 Idiopathic gout, left ankle and foot: Secondary | ICD-10-CM | POA: Diagnosis not present

## 2020-11-12 DIAGNOSIS — E1142 Type 2 diabetes mellitus with diabetic polyneuropathy: Secondary | ICD-10-CM | POA: Insufficient documentation

## 2020-11-12 DIAGNOSIS — M109 Gout, unspecified: Secondary | ICD-10-CM | POA: Insufficient documentation

## 2020-11-12 DIAGNOSIS — Z860100 Personal history of colon polyps, unspecified: Secondary | ICD-10-CM | POA: Insufficient documentation

## 2020-11-12 DIAGNOSIS — M778 Other enthesopathies, not elsewhere classified: Secondary | ICD-10-CM

## 2020-11-12 DIAGNOSIS — N529 Male erectile dysfunction, unspecified: Secondary | ICD-10-CM | POA: Insufficient documentation

## 2020-11-12 DIAGNOSIS — N1832 Chronic kidney disease, stage 3b: Secondary | ICD-10-CM | POA: Insufficient documentation

## 2020-11-12 DIAGNOSIS — Z794 Long term (current) use of insulin: Secondary | ICD-10-CM | POA: Insufficient documentation

## 2020-11-12 DIAGNOSIS — E785 Hyperlipidemia, unspecified: Secondary | ICD-10-CM | POA: Insufficient documentation

## 2020-11-12 DIAGNOSIS — I2510 Atherosclerotic heart disease of native coronary artery without angina pectoris: Secondary | ICD-10-CM | POA: Insufficient documentation

## 2020-11-12 DIAGNOSIS — E291 Testicular hypofunction: Secondary | ICD-10-CM | POA: Insufficient documentation

## 2020-11-12 DIAGNOSIS — E538 Deficiency of other specified B group vitamins: Secondary | ICD-10-CM | POA: Insufficient documentation

## 2020-11-12 DIAGNOSIS — E669 Obesity, unspecified: Secondary | ICD-10-CM | POA: Insufficient documentation

## 2020-11-12 MED ORDER — DEXAMETHASONE SODIUM PHOSPHATE 120 MG/30ML IJ SOLN
2.0000 mg | Freq: Once | INTRAMUSCULAR | Status: AC
  Administered 2020-11-12: 2 mg via INTRA_ARTICULAR

## 2020-11-12 NOTE — Progress Notes (Signed)
Subjective: 70 year old male presents the office today for concerns of left ankle pain with started last week.  He states he was off his gout medicine for about a week and as when the symptoms started.  Denies recent injury or falls.  He is still taking allopurinol.  He is not sure what medication he takes but sounds like indomethacin that he is taking on a daily basis as well with food in the morning. Denies any systemic complaints such as fevers, chills, nausea, vomiting. No acute changes since last appointment, and no other complaints at this time.   Objective: AAO x3, NAD DP/PT pulses palpable bilaterally, CRT less than 3 seconds There is localized edema present as well as some faint erythema to the medial aspect of the ankle on the left side.  There is also tenderness on the navicular tuberosity.  No pain the dorsal or plantar navicular.  Flexor, extensor tendons appear to be intact.  MMT 5/5. No pain with calf compression, swelling, warmth, erythema  Assessment: Capsulitis, likely gout left ankle  Plan: -All treatment options discussed with the patient including all alternatives, risks, complications.  -X-rays of the ankle obtained and reviewed.  No subacute fracture. -Patient is requesting steroid injection today.  He understands the risks of this.  The pain is around the navicular tuberosity area medially.  Quarter cc dexamethasone phosphate, quarter cc Marcaine plain was infiltrated into the area of tenderness without complications.  Care was taken not to directly infiltrated the posterior tibial tendon.  He has a brace at home that I want to wear for the next 1 to 2 weeks. -He is taking allopurinol daily.  He recently had blood work with his primary care physician.  I will try to obtain the results of blood work. -Patient encouraged to call the office with any questions, concerns, change in symptoms.   Trula Slade DPM

## 2020-11-13 ENCOUNTER — Telehealth: Payer: Self-pay | Admitting: *Deleted

## 2020-11-13 NOTE — Telephone Encounter (Signed)
-----   Message from Trula Slade, DPM sent at 11/12/2020  6:11 PM EDT ----- Can you call his primary care physician's office and get the results of the blood work that he had done recently?  Thank you.

## 2020-11-13 NOTE — Telephone Encounter (Signed)
Called and spoke with [facility] today and got the blood work faxed over today. Lattie Haw

## 2020-11-20 ENCOUNTER — Telehealth: Payer: Self-pay | Admitting: *Deleted

## 2020-11-20 NOTE — Addendum Note (Signed)
Addended by: Cranford Mon R on: 11/20/2020 05:40 PM   Modules accepted: Orders

## 2020-11-20 NOTE — Telephone Encounter (Signed)
Called and left a message for the patient to come by and get a form to take to his primary care doctor to get the uric acid level re-checked per Dr Jacqualyn Posey. Lattie Haw

## 2020-12-17 ENCOUNTER — Ambulatory Visit: Payer: HMO | Admitting: Podiatry

## 2020-12-18 ENCOUNTER — Encounter: Payer: Self-pay | Admitting: Cardiology

## 2020-12-18 ENCOUNTER — Ambulatory Visit (INDEPENDENT_AMBULATORY_CARE_PROVIDER_SITE_OTHER): Payer: HMO | Admitting: Cardiology

## 2020-12-18 ENCOUNTER — Other Ambulatory Visit: Payer: Self-pay

## 2020-12-18 VITALS — BP 140/68 | HR 81 | Ht 70.0 in | Wt 273.0 lb

## 2020-12-18 DIAGNOSIS — R931 Abnormal findings on diagnostic imaging of heart and coronary circulation: Secondary | ICD-10-CM

## 2020-12-18 DIAGNOSIS — Z01812 Encounter for preprocedural laboratory examination: Secondary | ICD-10-CM | POA: Diagnosis not present

## 2020-12-18 DIAGNOSIS — I2510 Atherosclerotic heart disease of native coronary artery without angina pectoris: Secondary | ICD-10-CM | POA: Diagnosis not present

## 2020-12-18 DIAGNOSIS — Z01818 Encounter for other preprocedural examination: Secondary | ICD-10-CM | POA: Diagnosis not present

## 2020-12-18 MED ORDER — ROSUVASTATIN CALCIUM 20 MG PO TABS: 20.0000 mg | ORAL_TABLET | Freq: Every day | ORAL | 3 refills | Status: DC

## 2020-12-18 NOTE — Progress Notes (Signed)
Cardiology Office Note:    Date:  12/18/2020   ID:  Steven Mcclure, DOB 1950-09-29, MRN [id number]  PCP:  Glenis Smoker, MD   [facility] Providers Cardiologist:  None     Referring MD: Glenis Smoker, *     History of Present Illness:    Steven Mcclure is a 70 y.o. male here for the evaluation of elevated coronary calcium score, 2200 94th percentile at the request of Dr. Lindell Noe.    His brother has had bypass.  Another brother had PCI.  He has had diabetes for the past 20 years.  Morbid obesity.  Hyperlipidemia, hypertension.  Several years ago he did have atypical chest pain episode, some numbness in his left arm.  Ended up having a nuclear stress test which was unremarkable.  He has had occasional bouts of shortness of breath or dyspnea on exertion.  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  After his calcium score came back elevated, his primary care physician Dr. Lindell Noe increased his simvastatin.  I will go ahead and change him over to Crestor 20 mg once a day for high intensity statin, newer generation with less potential drug interactions.  Past Medical History:  Diagnosis Date   Body mass index 40.0-44.9, adult (HCC)    CKD (chronic kidney disease), stage III (HCC)    Coronary atherosclerosis due to severely calcified coronary lesion    Diabetes mellitus without complication ([place])    ED (erectile dysfunction)    Gout    History of kidney stones    Hx of colonic polyps    Hyperlipidemia    Hypertension    Long-term insulin use in type 2 diabetes ([place])    Low testosterone in male    Obesity    Vertigo    Vitamin B12 deficiency     Past Surgical History:  Procedure Laterality Date   KIDNEY STONE SURGERY      Current Medications: Current Meds  Medication Sig   albuterol (VENTOLIN HFA) 108 (90 Base) MCG/ACT inhaler 1 puff as needed   allopurinol (ZYLOPRIM) 100 MG  tablet TAKE 1 TABLET BY MOUTH TWICE A DAY   aspirin EC 81 MG tablet Take 81 mg by mouth daily.   BD INSULIN SYRINGE U/F 31G X 5/16" 1 ML MISC    colchicine 0.6 MG tablet Take 1 tablet (0.6 mg total) by mouth daily.   FLUAD 0.5 ML SUSY    guaiFENesin-codeine 100-10 MG/5ML syrup TAKE 1 TEASPOONFUL BY MOUTH EVERY 6 HOURS OR AT BEDTIME AS NEEDED FOR COUGH   indomethacin (INDOCIN) 50 MG capsule TAKE 1 CAPSULE (50 MG TOTAL) BY MOUTH 2 (TWO) TIMES DAILY WITH A MEAL.   LANTUS 100 UNIT/ML injection 90 Units at bedtime.   lisinopril (ZESTRIL) 20 MG tablet Take 1 tablet by mouth daily.   loratadine (CLARITIN) 10 MG tablet 2 tablets   metFORMIN (GLUCOPHAGE) 1000 MG tablet Client states he takes 1 tablet per day.   Omega-3 Fatty Acids (FISH OIL PO) Take by mouth daily at 6 (six) AM.   OneTouch Delica Lancets 65L MISC See admin instructions.   ONETOUCH VERIO test strip    rosuvastatin (CRESTOR) 20 MG tablet Take 1 tablet (20 mg total) by mouth daily.   TRULICITY 1.5 DJ/5.7SV SOPN once a week.   vitamin B-12 (CYANOCOBALAMIN) 1000 MCG tablet 1 TABLET ONCE A DAY ORALLY 30 DAY(S)   [DISCONTINUED] simvastatin (ZOCOR) 10 MG tablet  daily at 6 PM.   Current Facility-Administered Medications for the 12/18/20 encounter (Office Visit) with Jerline Pain, MD  Medication   triamcinolone acetonide (KENALOG) 10 MG/ML injection 10 mg     Allergies:   Atorvastatin, Hydrocod polst-cpm polst er, and Tussin cough [dextromethorphan hbr]   Social History   Socioeconomic History   Marital status: Widowed    Spouse name: Not on file   Number of children: Not on file   Years of education: Not on file   Highest education level: Not on file  Occupational History   Not on file  Tobacco Use   Smoking status: Former    Pack years: 0.00    Types: Cigarettes    Quit date: 52    Years since quitting: 43.5   Smokeless tobacco: Never  Vaping Use   Vaping Use: Never used  Substance and Sexual Activity   Alcohol use:  Yes    Comment: social   Drug use: Never   Sexual activity: Not on file  Other Topics Concern   Not on file  Social History Narrative   Not on file   Social Determinants of Health   Financial Resource Strain: Not on file  Food Insecurity: No Food Insecurity   Worried About Charity fundraiser in the Last Year: Never true   [place] in the Last Year: Never true  Transportation Needs: No Transportation Needs   Lack of Transportation (Medical): No   Lack of Transportation (Non-Medical): No  Physical Activity: Not on file  Stress: Not on file  Social Connections: Not on file     Family History: The patient's family history includes Diabetes in his father and mother.  His brothers have had coronary artery disease, CABG, PCI  ROS:   Please see the history of present illness.    Denies any fevers chills nausea vomiting syncope bleeding.  All other systems reviewed and are negative.  EKGs/Labs/Other Studies Reviewed:    The following studies were reviewed today:  Coronary calcium score 10/12/2020: Scan was personally reviewed and interpreted.  I showed him the images.  FINDINGS: Coronary arteries: Normal origins.   Coronary Calcium Score:   Left main: 154   Left anterior descending artery: 769   Left circumflex artery: 527   Right coronary artery: 771   Total: 2221   Percentile:   Pericardium: Normal.   Ascending Aorta: Normal caliber.     IMPRESSION: Coronary calcium score of 2221. This was 36 percentile for age-, race-, and sex-matched controls.  EKG:  EKG is ordered today.  The ekg ordered today demonstrates sinus rhythm with atrial bigeminy, PACs.  Recent Labs: No results found for requested labs within last 8760 hours.  Recent Lipid Panel    Component Value Date/Time   CHOL  12/22/2009 0404    108        ATP III CLASSIFICATION:  <200     mg/dL   Desirable  200-239  mg/dL   Borderline High  >=240    mg/dL   High          TRIG 205 (H)  12/22/2009 0404   HDL 29 (L) 12/22/2009 0404   CHOLHDL 3.7 12/22/2009 0404   VLDL 41 (H) 12/22/2009 0404   [place]  12/22/2009 0404    38        Total Cholesterol/HDL:CHD Risk Coronary Heart Disease Risk Table  Men   Women  1/2 Average Risk   3.4   3.3  Average Risk       5.0   4.4  2 X Average Risk   9.6   7.1  3 X Average Risk  23.4   11.0        Use the calculated Patient Ratio above and the CHD Risk Table to determine the patient's CHD Risk.        ATP III CLASSIFICATION (LDL):  <100     mg/dL   Optimal  100-129  mg/dL   Near or Above                    Optimal  130-159  mg/dL   Borderline  160-189  mg/dL   High  >190     mg/dL   Very High     Risk Assessment/Calculations:          Physical Exam:    VS:  BP 140/68 (BP Location: Left Arm, Patient Position: Sitting, Cuff Size: Normal)   Pulse 81   Ht 5\' 10"  (1.778 m)   Wt 273 lb (123.8 kg)   SpO2 94%   BMI 39.17 kg/m     Wt Readings from Last 3 Encounters:  12/18/20 273 lb (123.8 kg)     GEN:  Well nourished, well developed in no acute distress HEENT: Normal NECK: No JVD; No carotid bruits LYMPHATICS: No lymphadenopathy CARDIAC: RRR with occasional ectopy, no murmurs, rubs, gallops RESPIRATORY:  Clear to auscultation without rales, wheezing or rhonchi  ABDOMEN: Soft, non-tender, non-distended MUSCULOSKELETAL:  No edema; No deformity  SKIN: Warm and dry NEUROLOGIC:  Alert and oriented x 3 PSYCHIATRIC:  Normal affect   ASSESSMENT:    1. Elevated coronary artery calcium score   2. Pre-procedure lab exam   3. Atherosclerosis of native coronary artery of native heart without angina pectoris    PLAN:    In order of problems listed above:  Coronary artery disease/highly elevated coronary calcium of 2200, 94th percentile with diabetes hypertension hyperlipidemia and brothers with bypass and PCI's -Given the highly elevated coronary calcium score as well as previous symptoms of chest  discomfort, and occasional symptoms of dyspnea on exertion, we will go ahead and proceed with cardiac catheterization to exclude severe coronary artery disease.  Risks and benefits including stroke heart attack death renal impairment bleeding were discussed and he is willing to proceed.  Family history of coronary artery disease - Both of his brothers had significant CAD 1 of which had bypass surgery.  Diabetes with hyperlipidemia - We will go ahead and change his simvastatin over to Crestor 20 mg a day.  ALT 13. -Most recent LDL in March 2022 was 84.  Hemoglobin A1c 6.6.  Essential hypertension - On lisinopril.  Last creatinine 1.35.  Checking labs today.   Shared Decision Making/Informed Consent The risks [stroke (1 in 1000), death (1 in 1000), kidney failure [usually temporary] (1 in 500), bleeding (1 in 200), allergic reaction [possibly serious] (1 in 200)], benefits (diagnostic support and management of coronary artery disease) and alternatives of a cardiac catheterization were discussed in detail with Steven Mcclure and he is willing to proceed.    Medication Adjustments/Labs and Tests Ordered: Current medicines are reviewed at length with the patient today.  Concerns regarding medicines are outlined above.  Orders Placed This Encounter  Procedures   CBC   Basic metabolic panel   EKG [id number]   Meds ordered this  encounter  Medications   rosuvastatin (CRESTOR) 20 MG tablet    Sig: Take 1 tablet (20 mg total) by mouth daily.    Dispense:  90 tablet    Refill:  3    Patient Instructions  Medication Instructions:  Please discontinue your Simvastatin and start Crestor 20 mg a day. Continue all other medications as listed.  *If you need a refill on your cardiac medications before your next appointment, please call your pharmacy*  Lab Work: Please have blood work today (CBC, BMP)  If you have labs (blood work) drawn today and your tests are completely normal, you will receive  your results only by: MyChart Message (if you have [place]) OR A paper copy in the mail If you have any lab test that is abnormal or we need to change your treatment, we will call you to review the results.   Testing/Procedures:   [place] OFFICE [place], [place] [address] Dept: [phone] Loc: [place]  12/18/2020  You are scheduled for a Cardiac Catheterization on Monday, July 14 with Dr. Larae Grooms.  1. Please arrive at the [facility] (Main Entrance A) at [facility]: [address] [place], [place] [address] at 5:30 AM (two hours before your procedure to ensure your preparation). Free valet parking service is available.   Special note: Every effort is made to have your procedure done on time. Please understand that emergencies sometimes delay scheduled procedures.  2. Diet: Do not eat or drink anything after midnight prior to your procedure except sips of water to take medications.  3. Labs:  today (CBC, BMP)  4. Medication instructions in preparation for your procedure:  Stop taking, Glucophage (Metformin) 24 hours before.  Take only 45 units of insulin the night before your procedure. Do not take any insulin on the day of the procedure.  On the morning of your procedure, take your Aspirin and any morning medicines NOT listed above.  You may use sips of water.  5. Plan for one night stay--bring personal belongings. 6. Bring a current list of your medications and current insurance cards. 7. You MUST have a responsible person to drive you home. 8. Someone MUST be with you the first 24 hours after you arrive home or your discharge will be delayed. 9. Please wear clothes that are easy to get on and off and wear slip-on shoes.  Thank you for allowing [place] to care for you!   -- [place] [facility]  Follow-Up: At  [facility], you and your health needs are our priority.  As part of our continuing mission to provide you with exceptional heart care, we have created designated Provider Care Teams.  These Care Teams include your primary Cardiologist (physician) and Advanced Practice Providers (APPs -  Physician Assistants and Nurse Practitioners) who all work together to provide you with the care you need, when you need it.  We recommend signing up for the patient portal called "MyChart".  Sign up information is provided on this After Visit Summary.  MyChart is used to connect with patients for Virtual Visits (Telemedicine).  Patients are able to view lab/test results, encounter notes, upcoming appointments, etc.  Non-urgent messages can be sent to your provider as well.   To learn more about what you can do with MyChart, go to [email or web address].    Your next appointment:   3 month(s)  The  format for your next appointment:   In Person  Provider:   You may see Dr Marlou Porch or one of the following Advanced Practice Providers on your designated Care Team:   Cecilie Kicks, NP   Thank you for choosing [facility]!!     Signed, Candee Furbish, MD  12/18/2020 6:08 PM    [place]

## 2020-12-18 NOTE — Patient Instructions (Addendum)
Medication Instructions:  Please discontinue your Simvastatin and start Crestor 20 mg a day. Continue all other medications as listed.  *If you need a refill on your cardiac medications before your next appointment, please call your pharmacy*  Lab Work: Please have blood work today (CBC, BMP)  If you have labs (blood work) drawn today and your tests are completely normal, you will receive your results only by: MyChart Message (if you have [place]) OR A paper copy in the mail If you have any lab test that is abnormal or we need to change your treatment, we will call you to review the results.   Testing/Procedures:   [place] OFFICE [place], [place] [address] Dept: [phone] Loc: [place]  12/18/2020  You are scheduled for a Cardiac Catheterization on Monday, July 18 with Dr. Larae Grooms.  1. Please arrive at the [facility] (Main Entrance A) at [facility]: [address] [place], [place] [address] at 5:30 AM (two hours before your procedure to ensure your preparation). Free valet parking service is available.   Special note: Every effort is made to have your procedure done on time. Please understand that emergencies sometimes delay scheduled procedures.  2. Diet: Do not eat or drink anything after midnight prior to your procedure except sips of water to take medications.  3. Labs:  today (CBC, BMP)  4. Medication instructions in preparation for your procedure:  Stop taking, Glucophage (Metformin) 24 hours before.  Take only 45 units of insulin the night before your procedure. Do not take any insulin on the day of the procedure.  On the morning of your procedure, take your Aspirin and any morning medicines NOT listed above.  You may use sips of water.  5. Plan for one night stay--bring personal belongings. 6. Bring a current list of your  medications and current insurance cards. 7. You MUST have a responsible person to drive you home. 8. Someone MUST be with you the first 24 hours after you arrive home or your discharge will be delayed. 9. Please wear clothes that are easy to get on and off and wear slip-on shoes.  Thank you for allowing [place] to care for you!   -- [place] [facility]  Follow-Up: At [facility], you and your health needs are our priority.  As part of our continuing mission to provide you with exceptional heart care, we have created designated Provider Care Teams.  These Care Teams include your primary Cardiologist (physician) and Advanced Practice Providers (APPs -  Physician Assistants and Nurse Practitioners) who all work together to provide you with the care you need, when you need it.  We recommend signing up for the patient portal called "MyChart".  Sign up information is provided on this After Visit Summary.  MyChart is used to connect with patients for Virtual Visits (Telemedicine).  Patients are able to view lab/test results, encounter notes, upcoming appointments, etc.  Non-urgent messages can be sent to your provider as well.   To learn more about what you can do with MyChart, go to [email or web address].    Your next appointment:   3 month(s)  The format for your next appointment:   In Person  Provider:   You may see Dr Marlou Porch or one of the following Advanced Practice Providers on your designated Care Team:   Cecilie Kicks, NP   Thank you for choosing [facility]!!

## 2020-12-18 NOTE — H&P (View-Only) (Signed)
Cardiology Office Note:    Date:  12/18/2020   ID:  Steven Mcclure, DOB 17-Oct-1950, MRN [id number]  PCP:  Glenis Smoker, MD   [facility] Providers Cardiologist:  None     Referring MD: Glenis Smoker, *     History of Present Illness:    Steven Mcclure is a 70 y.o. male here for the evaluation of elevated coronary calcium score, 2200 94th percentile at the request of Dr. Lindell Noe.    His brother has had bypass.  Another brother had PCI.  He has had diabetes for the past 20 years.  Morbid obesity.  Hyperlipidemia, hypertension.  Several years ago he did have atypical chest pain episode, some numbness in his left arm.  Ended up having a nuclear stress test which was unremarkable.  He has had occasional bouts of shortness of breath or dyspnea on exertion.  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  After his calcium score came back elevated, his primary care physician Dr. Lindell Noe increased his simvastatin.  I will go ahead and change him over to Crestor 20 mg once a day for high intensity statin, newer generation with less potential drug interactions.  Past Medical History:  Diagnosis Date   Body mass index 40.0-44.9, adult (HCC)    CKD (chronic kidney disease), stage III (HCC)    Coronary atherosclerosis due to severely calcified coronary lesion    Diabetes mellitus without complication ([place])    ED (erectile dysfunction)    Gout    History of kidney stones    Hx of colonic polyps    Hyperlipidemia    Hypertension    Long-term insulin use in type 2 diabetes ([place])    Low testosterone in male    Obesity    Vertigo    Vitamin B12 deficiency     Past Surgical History:  Procedure Laterality Date   KIDNEY STONE SURGERY      Current Medications: Current Meds  Medication Sig   albuterol (VENTOLIN HFA) 108 (90 Base) MCG/ACT inhaler 1 puff as needed   allopurinol (ZYLOPRIM) 100 MG  tablet TAKE 1 TABLET BY MOUTH TWICE A DAY   aspirin EC 81 MG tablet Take 81 mg by mouth daily.   BD INSULIN SYRINGE U/F 31G X 5/16" 1 ML MISC    colchicine 0.6 MG tablet Take 1 tablet (0.6 mg total) by mouth daily.   FLUAD 0.5 ML SUSY    guaiFENesin-codeine 100-10 MG/5ML syrup TAKE 1 TEASPOONFUL BY MOUTH EVERY 6 HOURS OR AT BEDTIME AS NEEDED FOR COUGH   indomethacin (INDOCIN) 50 MG capsule TAKE 1 CAPSULE (50 MG TOTAL) BY MOUTH 2 (TWO) TIMES DAILY WITH A MEAL.   LANTUS 100 UNIT/ML injection 90 Units at bedtime.   lisinopril (ZESTRIL) 20 MG tablet Take 1 tablet by mouth daily.   loratadine (CLARITIN) 10 MG tablet 2 tablets   metFORMIN (GLUCOPHAGE) 1000 MG tablet Client states he takes 1 tablet per day.   Omega-3 Fatty Acids (FISH OIL PO) Take by mouth daily at 6 (six) AM.   OneTouch Delica Lancets 08X MISC See admin instructions.   ONETOUCH VERIO test strip    rosuvastatin (CRESTOR) 20 MG tablet Take 1 tablet (20 mg total) by mouth daily.   TRULICITY 1.5 KG/8.1EH SOPN once a week.   vitamin B-12 (CYANOCOBALAMIN) 1000 MCG tablet 1 TABLET ONCE A DAY ORALLY 30 DAY(S)   [DISCONTINUED] simvastatin (ZOCOR) 10 MG tablet  daily at 6 PM.   Current Facility-Administered Medications for the 12/18/20 encounter (Office Visit) with Jerline Pain, MD  Medication   triamcinolone acetonide (KENALOG) 10 MG/ML injection 10 mg     Allergies:   Atorvastatin, Hydrocod polst-cpm polst er, and Tussin cough [dextromethorphan hbr]   Social History   Socioeconomic History   Marital status: Widowed    Spouse name: Not on file   Number of children: Not on file   Years of education: Not on file   Highest education level: Not on file  Occupational History   Not on file  Tobacco Use   Smoking status: Former    Pack years: 0.00    Types: Cigarettes    Quit date: 41    Years since quitting: 43.5   Smokeless tobacco: Never  Vaping Use   Vaping Use: Never used  Substance and Sexual Activity   Alcohol use:  Yes    Comment: social   Drug use: Never   Sexual activity: Not on file  Other Topics Concern   Not on file  Social History Narrative   Not on file   Social Determinants of Health   Financial Resource Strain: Not on file  Food Insecurity: No Food Insecurity   Worried About Charity fundraiser in the Last Year: Never true   [place] in the Last Year: Never true  Transportation Needs: No Transportation Needs   Lack of Transportation (Medical): No   Lack of Transportation (Non-Medical): No  Physical Activity: Not on file  Stress: Not on file  Social Connections: Not on file     Family History: The patient's family history includes Diabetes in his father and mother.  His brothers have had coronary artery disease, CABG, PCI  ROS:   Please see the history of present illness.    Denies any fevers chills nausea vomiting syncope bleeding.  All other systems reviewed and are negative.  EKGs/Labs/Other Studies Reviewed:    The following studies were reviewed today:  Coronary calcium score 10/12/2020: Scan was personally reviewed and interpreted.  I showed him the images.  FINDINGS: Coronary arteries: Normal origins.   Coronary Calcium Score:   Left main: 154   Left anterior descending artery: 769   Left circumflex artery: 527   Right coronary artery: 771   Total: 2221   Percentile:   Pericardium: Normal.   Ascending Aorta: Normal caliber.     IMPRESSION: Coronary calcium score of 2221. This was 10 percentile for age-, race-, and sex-matched controls.  EKG:  EKG is ordered today.  The ekg ordered today demonstrates sinus rhythm with atrial bigeminy, PACs.  Recent Labs: No results found for requested labs within last 8760 hours.  Recent Lipid Panel    Component Value Date/Time   CHOL  12/22/2009 0404    108        ATP III CLASSIFICATION:  <200     mg/dL   Desirable  200-239  mg/dL   Borderline High  >=240    mg/dL   High          TRIG 205 (H)  12/22/2009 0404   HDL 29 (L) 12/22/2009 0404   CHOLHDL 3.7 12/22/2009 0404   VLDL 41 (H) 12/22/2009 0404   [place]  12/22/2009 0404    38        Total Cholesterol/HDL:CHD Risk Coronary Heart Disease Risk Table  Men   Women  1/2 Average Risk   3.4   3.3  Average Risk       5.0   4.4  2 X Average Risk   9.6   7.1  3 X Average Risk  23.4   11.0        Use the calculated Patient Ratio above and the CHD Risk Table to determine the patient's CHD Risk.        ATP III CLASSIFICATION (LDL):  <100     mg/dL   Optimal  100-129  mg/dL   Near or Above                    Optimal  130-159  mg/dL   Borderline  160-189  mg/dL   High  >190     mg/dL   Very High     Risk Assessment/Calculations:          Physical Exam:    VS:  BP 140/68 (BP Location: Left Arm, Patient Position: Sitting, Cuff Size: Normal)   Pulse 81   Ht 5\' 10"  (1.778 m)   Wt 273 lb (123.8 kg)   SpO2 94%   BMI 39.17 kg/m     Wt Readings from Last 3 Encounters:  12/18/20 273 lb (123.8 kg)     GEN:  Well nourished, well developed in no acute distress HEENT: Normal NECK: No JVD; No carotid bruits LYMPHATICS: No lymphadenopathy CARDIAC: RRR with occasional ectopy, no murmurs, rubs, gallops RESPIRATORY:  Clear to auscultation without rales, wheezing or rhonchi  ABDOMEN: Soft, non-tender, non-distended MUSCULOSKELETAL:  No edema; No deformity  SKIN: Warm and dry NEUROLOGIC:  Alert and oriented x 3 PSYCHIATRIC:  Normal affect   ASSESSMENT:    1. Elevated coronary artery calcium score   2. Pre-procedure lab exam   3. Atherosclerosis of native coronary artery of native heart without angina pectoris    PLAN:    In order of problems listed above:  Coronary artery disease/highly elevated coronary calcium of 2200, 94th percentile with diabetes hypertension hyperlipidemia and brothers with bypass and PCI's -Given the highly elevated coronary calcium score as well as previous symptoms of chest  discomfort, and occasional symptoms of dyspnea on exertion, we will go ahead and proceed with cardiac catheterization to exclude severe coronary artery disease.  Risks and benefits including stroke heart attack death renal impairment bleeding were discussed and he is willing to proceed.  Family history of coronary artery disease - Both of his brothers had significant CAD 1 of which had bypass surgery.  Diabetes with hyperlipidemia - We will go ahead and change his simvastatin over to Crestor 20 mg a day.  ALT 13. -Most recent LDL in March 2022 was 84.  Hemoglobin A1c 6.6.  Essential hypertension - On lisinopril.  Last creatinine 1.35.  Checking labs today.   Shared Decision Making/Informed Consent The risks [stroke (1 in 1000), death (1 in 1000), kidney failure [usually temporary] (1 in 500), bleeding (1 in 200), allergic reaction [possibly serious] (1 in 200)], benefits (diagnostic support and management of coronary artery disease) and alternatives of a cardiac catheterization were discussed in detail with Steven Mcclure and he is willing to proceed.    Medication Adjustments/Labs and Tests Ordered: Current medicines are reviewed at length with the patient today.  Concerns regarding medicines are outlined above.  Orders Placed This Encounter  Procedures   CBC   Basic metabolic panel   EKG [id number]   Meds ordered this  encounter  Medications   rosuvastatin (CRESTOR) 20 MG tablet    Sig: Take 1 tablet (20 mg total) by mouth daily.    Dispense:  90 tablet    Refill:  3    Patient Instructions  Medication Instructions:  Please discontinue your Simvastatin and start Crestor 20 mg a day. Continue all other medications as listed.  *If you need a refill on your cardiac medications before your next appointment, please call your pharmacy*  Lab Work: Please have blood work today (CBC, BMP)  If you have labs (blood work) drawn today and your tests are completely normal, you will receive  your results only by: MyChart Message (if you have [place]) OR A paper copy in the mail If you have any lab test that is abnormal or we need to change your treatment, we will call you to review the results.   Testing/Procedures:   [place] OFFICE [place], [place] [address] Dept: [phone] Loc: [place]  12/18/2020  You are scheduled for a Cardiac Catheterization on Monday, July 14 with Dr. Larae Grooms.  1. Please arrive at the [facility] (Main Entrance A) at [facility]: [address] [place], [place] [address] at 5:30 AM (two hours before your procedure to ensure your preparation). Free valet parking service is available.   Special note: Every effort is made to have your procedure done on time. Please understand that emergencies sometimes delay scheduled procedures.  2. Diet: Do not eat or drink anything after midnight prior to your procedure except sips of water to take medications.  3. Labs:  today (CBC, BMP)  4. Medication instructions in preparation for your procedure:  Stop taking, Glucophage (Metformin) 24 hours before.  Take only 45 units of insulin the night before your procedure. Do not take any insulin on the day of the procedure.  On the morning of your procedure, take your Aspirin and any morning medicines NOT listed above.  You may use sips of water.  5. Plan for one night stay--bring personal belongings. 6. Bring a current list of your medications and current insurance cards. 7. You MUST have a responsible person to drive you home. 8. Someone MUST be with you the first 24 hours after you arrive home or your discharge will be delayed. 9. Please wear clothes that are easy to get on and off and wear slip-on shoes.  Thank you for allowing [place] to care for you!   -- [facility]  Follow-Up: At  [facility], you and your health needs are our priority.  As part of our continuing mission to provide you with exceptional heart care, we have created designated Provider Care Teams.  These Care Teams include your primary Cardiologist (physician) and Advanced Practice Providers (APPs -  Physician Assistants and Nurse Practitioners) who all work together to provide you with the care you need, when you need it.  We recommend signing up for the patient portal called "MyChart".  Sign up information is provided on this After Visit Summary.  MyChart is used to connect with patients for Virtual Visits (Telemedicine).  Patients are able to view lab/test results, encounter notes, upcoming appointments, etc.  Non-urgent messages can be sent to your provider as well.   To learn more about what you can do with MyChart, go to [email or web address].    Your next appointment:   3 month(s)  The  format for your next appointment:   In Person  Provider:   You may see Dr Marlou Porch or one of the following Advanced Practice Providers on your designated Care Team:   Cecilie Kicks, NP   Thank you for choosing [facility]!!     Signed, Candee Furbish, MD  12/18/2020 6:08 PM    [place]

## 2020-12-19 LAB — BASIC METABOLIC PANEL
BUN/Creatinine Ratio: 16 (ref 10–24)
BUN: 25 mg/dL (ref 8–27)
CO2: 24 mmol/L (ref 20–29)
Calcium: 9.5 mg/dL (ref 8.6–10.2)
Chloride: 101 mmol/L (ref 96–106)
Creatinine, Ser: 1.54 mg/dL — ABNORMAL HIGH (ref 0.76–1.27)
Glucose: 92 mg/dL (ref 65–99)
Potassium: 5.2 mmol/L (ref 3.5–5.2)
Sodium: 138 mmol/L (ref 134–144)
eGFR: 48 mL/min/{1.73_m2} — ABNORMAL LOW (ref >59)

## 2020-12-19 LAB — CBC
Hematocrit: 44.7 % (ref 37.5–51.0)
Hemoglobin: 14.9 g/dL (ref 13.0–17.7)
MCH: 29.4 pg (ref 26.6–33.0)
MCHC: 33.3 g/dL (ref 31.5–35.7)
MCV: 88 fL (ref 79–97)
Platelets: 294 10*3/uL (ref 150–450)
RBC: 5.06 x10E6/uL (ref 4.14–5.80)
RDW: 13.3 % (ref 11.6–15.4)
WBC: 15.1 10*3/uL — ABNORMAL HIGH (ref 3.4–10.8)

## 2020-12-20 ENCOUNTER — Telehealth: Payer: Self-pay | Admitting: *Deleted

## 2020-12-20 NOTE — Telephone Encounter (Signed)
Spoke with patient, he asked me to return call around 11:15 this morning.

## 2020-12-20 NOTE — Telephone Encounter (Addendum)
Below copied from 12/18/20 BMP results: Chronic elevated WBC. Stable Creat stable at 1.54 (same as 2 years ago). Let's have him come in one hour early for more hydration prior to heart cath.  Thanks Candee Furbish, MD --------------- Procedure time has been moved from 7:30 AM to 9 AM to allow time for more hydration/arrival time remains 5:30 AM.  Pt contacted pre-catheterization scheduled at [facility] for:  Monday December 24, 2020 9 AM Verified arrival time and place: [place] [facility]) at: 5:30 AM-pre-procedure hydration.   No solid food after midnight prior to cath, clear liquids until 5 AM day of procedure.  Hold: Lisinopril-day before and day of procedure-GFR 48 Insulin-AM of procedure Metformin-day of procedure and 48 hours post procedure. Indocin (pt reports not taking)  Except hold medications AM meds can be  taken pre-cath with sips of water including: aspirin 81 mg   Confirmed patient has responsible adult to drive home post procedure and be with patient first 24 hours after arriving home: yes  You are allowed ONE visitor in the waiting room during the time you are at the hospital for your procedure. Both you and your visitor must wear a mask once you enter the hospital.   Patient reports does not currently have any symptoms concerning for COVID-19 and no household members with COVID-19 like illness.      Reviewed procedure/mask/visitor instructions with patient.

## 2020-12-20 NOTE — Telephone Encounter (Signed)
Reviewed procedure/mask/visitor instructions with patient. 

## 2020-12-24 ENCOUNTER — Ambulatory Visit (HOSPITAL_COMMUNITY)
Admission: RE | Admit: 2020-12-24 | Discharge: 2020-12-24 | Disposition: A | Discharge status: Home / Self Care | Payer: HMO | Attending: Interventional Cardiology | Admitting: Interventional Cardiology

## 2020-12-24 ENCOUNTER — Encounter (HOSPITAL_COMMUNITY)
Admission: RE | Disposition: A | Discharge status: Home / Self Care | Payer: Self-pay | Source: Home / Self Care | Attending: Interventional Cardiology

## 2020-12-24 ENCOUNTER — Other Ambulatory Visit: Payer: Self-pay

## 2020-12-24 ENCOUNTER — Encounter (HOSPITAL_COMMUNITY): Payer: Self-pay | Admitting: Interventional Cardiology

## 2020-12-24 DIAGNOSIS — E1169 Type 2 diabetes mellitus with other specified complication: Secondary | ICD-10-CM | POA: Diagnosis not present

## 2020-12-24 DIAGNOSIS — Z79899 Other long term (current) drug therapy: Secondary | ICD-10-CM | POA: Insufficient documentation

## 2020-12-24 DIAGNOSIS — I129 Hypertensive chronic kidney disease with stage 1 through stage 4 chronic kidney disease, or unspecified chronic kidney disease: Secondary | ICD-10-CM | POA: Insufficient documentation

## 2020-12-24 DIAGNOSIS — Z794 Long term (current) use of insulin: Secondary | ICD-10-CM | POA: Insufficient documentation

## 2020-12-24 DIAGNOSIS — N183 Chronic kidney disease, stage 3 unspecified: Secondary | ICD-10-CM | POA: Diagnosis not present

## 2020-12-24 DIAGNOSIS — Z833 Family history of diabetes mellitus: Secondary | ICD-10-CM | POA: Insufficient documentation

## 2020-12-24 DIAGNOSIS — R0609 Other forms of dyspnea: Secondary | ICD-10-CM

## 2020-12-24 DIAGNOSIS — Z87891 Personal history of nicotine dependence: Secondary | ICD-10-CM | POA: Diagnosis not present

## 2020-12-24 DIAGNOSIS — E785 Hyperlipidemia, unspecified: Secondary | ICD-10-CM | POA: Insufficient documentation

## 2020-12-24 DIAGNOSIS — E669 Obesity, unspecified: Secondary | ICD-10-CM | POA: Diagnosis not present

## 2020-12-24 DIAGNOSIS — Z888 Allergy status to other drugs, medicaments and biological substances status: Secondary | ICD-10-CM | POA: Insufficient documentation

## 2020-12-24 DIAGNOSIS — Z7982 Long term (current) use of aspirin: Secondary | ICD-10-CM | POA: Diagnosis not present

## 2020-12-24 DIAGNOSIS — Z8249 Family history of ischemic heart disease and other diseases of the circulatory system: Secondary | ICD-10-CM | POA: Insufficient documentation

## 2020-12-24 DIAGNOSIS — E1122 Type 2 diabetes mellitus with diabetic chronic kidney disease: Secondary | ICD-10-CM | POA: Diagnosis not present

## 2020-12-24 DIAGNOSIS — R0600 Dyspnea, unspecified: Secondary | ICD-10-CM | POA: Insufficient documentation

## 2020-12-24 DIAGNOSIS — I2510 Atherosclerotic heart disease of native coronary artery without angina pectoris: Secondary | ICD-10-CM | POA: Diagnosis not present

## 2020-12-24 DIAGNOSIS — Z6839 Body mass index (BMI) 39.0-39.9, adult: Secondary | ICD-10-CM | POA: Diagnosis not present

## 2020-12-24 HISTORY — PX: LEFT HEART CATH AND CORONARY ANGIOGRAPHY: CATH118249

## 2020-12-24 LAB — GLUCOSE, CAPILLARY: Glucose-Capillary: 119 mg/dL — ABNORMAL HIGH (ref 70–99)

## 2020-12-24 SURGERY — LEFT HEART CATH AND CORONARY ANGIOGRAPHY
Anesthesia: LOCAL

## 2020-12-24 MED ORDER — VERAPAMIL HCL 2.5 MG/ML IV SOLN
INTRAVENOUS | Status: DC | PRN
  Administered 2020-12-24: 10 mL via INTRA_ARTERIAL

## 2020-12-24 MED ORDER — HEPARIN SODIUM (PORCINE) 1000 UNIT/ML IJ SOLN
INTRAMUSCULAR | Status: AC
  Filled 2020-12-24: qty 1

## 2020-12-24 MED ORDER — ALLOPURINOL 100 MG PO TABS: 100.0000 mg | ORAL_TABLET | Freq: Two times a day (BID) | ORAL | Status: DC

## 2020-12-24 MED ORDER — MIDAZOLAM HCL 2 MG/2ML IJ SOLN
INTRAMUSCULAR | Status: AC
  Filled 2020-12-24: qty 2

## 2020-12-24 MED ORDER — HEPARIN (PORCINE) IN NACL 1000-0.9 UT/500ML-% IV SOLN
INTRAVENOUS | Status: DC | PRN
  Administered 2020-12-24 (×2): 500 mL

## 2020-12-24 MED ORDER — SODIUM CHLORIDE 0.9% FLUSH: 3.0000 mL | Freq: Two times a day (BID) | INTRAVENOUS | Status: DC

## 2020-12-24 MED ORDER — HEPARIN SODIUM (PORCINE) 1000 UNIT/ML IJ SOLN
INTRAMUSCULAR | Status: DC | PRN
  Administered 2020-12-24: 6000 [IU] via INTRAVENOUS

## 2020-12-24 MED ORDER — SODIUM CHLORIDE 0.9 % WEIGHT BASED INFUSION
1.0000 mL/kg/h | INTRAVENOUS | Status: DC
  Administered 2020-12-24: 1 mL/kg/h via INTRAVENOUS

## 2020-12-24 MED ORDER — ONDANSETRON HCL 4 MG/2ML IJ SOLN: 4.0000 mg | Freq: Four times a day (QID) | INTRAMUSCULAR | Status: DC | PRN

## 2020-12-24 MED ORDER — IOHEXOL 350 MG/ML SOLN
INTRAVENOUS | Status: DC | PRN
  Administered 2020-12-24: 35 mL via INTRACARDIAC

## 2020-12-24 MED ORDER — HYDRALAZINE HCL 20 MG/ML IJ SOLN: 10.0000 mg | INTRAMUSCULAR | Status: DC | PRN

## 2020-12-24 MED ORDER — LIDOCAINE HCL (PF) 1 % IJ SOLN
INTRAMUSCULAR | Status: DC | PRN
  Administered 2020-12-24: 2 mL

## 2020-12-24 MED ORDER — ACETAMINOPHEN 325 MG PO TABS: 650.0000 mg | ORAL_TABLET | ORAL | Status: DC | PRN

## 2020-12-24 MED ORDER — SODIUM CHLORIDE 0.9 % IV SOLN: 250.0000 mL | INTRAVENOUS | Status: DC | PRN

## 2020-12-24 MED ORDER — VERAPAMIL HCL 2.5 MG/ML IV SOLN
INTRAVENOUS | Status: AC
  Filled 2020-12-24: qty 2

## 2020-12-24 MED ORDER — ASPIRIN 81 MG PO CHEW: 81.0000 mg | CHEWABLE_TABLET | ORAL | Status: DC

## 2020-12-24 MED ORDER — SODIUM CHLORIDE 0.9% FLUSH: 3.0000 mL | INTRAVENOUS | Status: DC | PRN

## 2020-12-24 MED ORDER — HEPARIN (PORCINE) IN NACL 1000-0.9 UT/500ML-% IV SOLN
INTRAVENOUS | Status: AC
  Filled 2020-12-24: qty 1000

## 2020-12-24 MED ORDER — FENTANYL CITRATE (PF) 100 MCG/2ML IJ SOLN
INTRAMUSCULAR | Status: AC
  Filled 2020-12-24: qty 2

## 2020-12-24 MED ORDER — METFORMIN HCL 1000 MG PO TABS: 1000.0000 mg | ORAL_TABLET | Freq: Every day | ORAL | 10 refills | Status: AC

## 2020-12-24 MED ORDER — LABETALOL HCL 5 MG/ML IV SOLN: 10.0000 mg | INTRAVENOUS | Status: DC | PRN

## 2020-12-24 MED ORDER — SODIUM CHLORIDE 0.9 % IV SOLN: INTRAVENOUS | Status: DC

## 2020-12-24 MED ORDER — LIDOCAINE HCL (PF) 1 % IJ SOLN
INTRAMUSCULAR | Status: AC
  Filled 2020-12-24: qty 30

## 2020-12-24 MED ORDER — MIDAZOLAM HCL 2 MG/2ML IJ SOLN
INTRAMUSCULAR | Status: DC | PRN
  Administered 2020-12-24: 2 mg via INTRAVENOUS
  Administered 2020-12-24: 1 mg via INTRAVENOUS

## 2020-12-24 MED ORDER — FENTANYL CITRATE (PF) 100 MCG/2ML IJ SOLN
INTRAMUSCULAR | Status: DC | PRN
  Administered 2020-12-24 (×2): 25 ug via INTRAVENOUS

## 2020-12-24 MED ORDER — SODIUM CHLORIDE 0.9 % WEIGHT BASED INFUSION
3.0000 mL/kg/h | INTRAVENOUS | Status: AC
  Administered 2020-12-24: 3 mL/kg/h via INTRAVENOUS

## 2020-12-24 SURGICAL SUPPLY — 9 items
CATH 5FR JL3.5 JR4 ANG PIG MP (CATHETERS) ×1 IMPLANT
DEVICE RAD COMP TR BAND LRG (VASCULAR PRODUCTS) ×2 IMPLANT
GLIDESHEATH SLEND SS 6F .021 (SHEATH) ×2 IMPLANT
GUIDEWIRE INQWIRE 1.5J.035X260 (WIRE) IMPLANT
INQWIRE 1.5J .035X260CM (WIRE) ×2
KIT HEART LEFT (KITS) ×2 IMPLANT
PACK CARDIAC CATHETERIZATION (CUSTOM PROCEDURE TRAY) ×2 IMPLANT
TRANSDUCER W/STOPCOCK (MISCELLANEOUS) ×2 IMPLANT
TUBING CIL FLEX 10 FLL-RA (TUBING) ×2 IMPLANT

## 2020-12-24 NOTE — Interval H&P Note (Signed)
Cath Lab Visit (complete for each Cath Lab visit)  Clinical Evaluation Leading to the Procedure:   ACS: No.  Non-ACS:    Anginal Classification: CCS III  Anti-ischemic medical therapy: Minimal Therapy (1 class of medications)  Non-Invasive Test Results: Intermediate-risk stress test findings: cardiac mortality 1-3%/year  Prior CABG: No previous CABG   High calcium score by calcium score CT   History and Physical Interval Note:  12/24/2020 8:56 AM  Steven Mcclure  has presented today for surgery, with the diagnosis of abnormal ct.  The various methods of treatment have been discussed with the patient and family. After consideration of risks, benefits and other options for treatment, the patient has consented to  Procedure(s): LEFT HEART CATH AND CORONARY ANGIOGRAPHY (N/A) as a surgical intervention.  The patient's history has been reviewed, patient examined, no change in status, stable for surgery.  I have reviewed the patient's chart and labs.  Questions were answered to the patient's satisfaction.     Larae Grooms

## 2021-02-28 DIAGNOSIS — N1832 Chronic kidney disease, stage 3b: Secondary | ICD-10-CM | POA: Diagnosis not present

## 2021-02-28 DIAGNOSIS — E669 Obesity, unspecified: Secondary | ICD-10-CM | POA: Diagnosis not present

## 2021-02-28 DIAGNOSIS — I10 Essential (primary) hypertension: Secondary | ICD-10-CM | POA: Diagnosis not present

## 2021-02-28 DIAGNOSIS — Z23 Encounter for immunization: Secondary | ICD-10-CM | POA: Diagnosis not present

## 2021-02-28 DIAGNOSIS — Z794 Long term (current) use of insulin: Secondary | ICD-10-CM | POA: Diagnosis not present

## 2021-02-28 DIAGNOSIS — E1142 Type 2 diabetes mellitus with diabetic polyneuropathy: Secondary | ICD-10-CM | POA: Diagnosis not present

## 2021-03-06 DIAGNOSIS — N183 Chronic kidney disease, stage 3 unspecified: Secondary | ICD-10-CM | POA: Diagnosis not present

## 2021-03-06 DIAGNOSIS — E1142 Type 2 diabetes mellitus with diabetic polyneuropathy: Secondary | ICD-10-CM | POA: Diagnosis not present

## 2021-03-11 DIAGNOSIS — I129 Hypertensive chronic kidney disease with stage 1 through stage 4 chronic kidney disease, or unspecified chronic kidney disease: Secondary | ICD-10-CM | POA: Diagnosis not present

## 2021-03-11 DIAGNOSIS — N183 Chronic kidney disease, stage 3 unspecified: Secondary | ICD-10-CM | POA: Diagnosis not present

## 2021-05-14 ENCOUNTER — Other Ambulatory Visit: Payer: Self-pay | Admitting: Podiatry

## 2021-05-14 ENCOUNTER — Ambulatory Visit (INDEPENDENT_AMBULATORY_CARE_PROVIDER_SITE_OTHER): Payer: HMO

## 2021-05-14 ENCOUNTER — Ambulatory Visit: Payer: HMO | Admitting: Podiatry

## 2021-05-14 ENCOUNTER — Encounter: Payer: Self-pay | Admitting: Podiatry

## 2021-05-14 ENCOUNTER — Other Ambulatory Visit: Payer: Self-pay

## 2021-05-14 DIAGNOSIS — M7752 Other enthesopathy of left foot: Secondary | ICD-10-CM | POA: Diagnosis not present

## 2021-05-14 DIAGNOSIS — M10071 Idiopathic gout, right ankle and foot: Secondary | ICD-10-CM | POA: Diagnosis not present

## 2021-05-14 DIAGNOSIS — G720 Drug-induced myopathy: Secondary | ICD-10-CM | POA: Insufficient documentation

## 2021-05-14 MED ORDER — TRIAMCINOLONE ACETONIDE 10 MG/ML IJ SUSP
10.0000 mg | Freq: Once | INTRAMUSCULAR | Status: AC
  Administered 2021-05-14: 10 mg

## 2021-05-15 LAB — BASIC METABOLIC PANEL
BUN/Creatinine Ratio: 18 (ref 10–24)
BUN: 24 mg/dL (ref 8–27)
CO2: 25 mmol/L (ref 20–29)
Calcium: 9.8 mg/dL (ref 8.6–10.2)
Chloride: 100 mmol/L (ref 96–106)
Creatinine, Ser: 1.37 mg/dL — ABNORMAL HIGH (ref 0.76–1.27)
Glucose: 111 mg/dL — ABNORMAL HIGH (ref 70–99)
Potassium: 5 mmol/L (ref 3.5–5.2)
Sodium: 141 mmol/L (ref 134–144)
eGFR: 55 mL/min/{1.73_m2} — ABNORMAL LOW (ref >59)

## 2021-05-15 LAB — CBC WITH DIFFERENTIAL/PLATELET
Basophils Absolute: 0.1 10*3/uL (ref 0.0–0.2)
Basos: 1 %
EOS (ABSOLUTE): 0.2 10*3/uL (ref 0.0–0.4)
Eos: 1 %
Hematocrit: 43.6 % (ref 37.5–51.0)
Hemoglobin: 14.7 g/dL (ref 13.0–17.7)
Immature Grans (Abs): 0 10*3/uL (ref 0.0–0.1)
Immature Granulocytes: 0 %
Lymphocytes Absolute: 2.8 10*3/uL (ref 0.7–3.1)
Lymphs: 18 %
MCH: 29.4 pg (ref 26.6–33.0)
MCHC: 33.7 g/dL (ref 31.5–35.7)
MCV: 87 fL (ref 79–97)
Monocytes Absolute: 1.3 10*3/uL — ABNORMAL HIGH (ref 0.1–0.9)
Monocytes: 8 %
Neutrophils Absolute: 11.6 10*3/uL — ABNORMAL HIGH (ref 1.4–7.0)
Neutrophils: 72 %
Platelets: 321 10*3/uL (ref 150–450)
RBC: 5 x10E6/uL (ref 4.14–5.80)
RDW: 12.9 % (ref 11.6–15.4)
WBC: 16 10*3/uL — ABNORMAL HIGH (ref 3.4–10.8)

## 2021-05-15 LAB — URIC ACID: Uric Acid: 6.5 mg/dL (ref 3.8–8.4)

## 2021-05-16 ENCOUNTER — Other Ambulatory Visit: Payer: Self-pay | Admitting: Podiatry

## 2021-05-16 MED ORDER — DOXYCYCLINE HYCLATE 100 MG PO TABS: 100.0000 mg | ORAL_TABLET | Freq: Two times a day (BID) | ORAL | 0 refills | Status: DC

## 2021-05-17 NOTE — Progress Notes (Signed)
Subjective: 70 year old male presents the office today for concerns of possible reoccurrence of gout which started on Friday.  He states that on Saturday he was having to start using the cane given the pain to his ankle.  He denies any recent injury.  No open lesions.  He states it feels like it did previously when he had gout.  Objective: AAO x3, NAD DP/PT pulses palpable bilaterally, CRT less than 3 seconds On the medial aspect of the ankle on the course of the anteromedial ankle joint as well as the flexor tendon there is localized edema and erythema with tenderness palpation on the anterior medial, flexor tendons.  He is able to move the ankle joint.  There is no skin breakdown.  There is no fluctuation crepitation. No pain with calf compression, swelling, warmth, erythema  Assessment: Capsulitis left ankle, likely gout  Plan: -All treatment options discussed with the patient including all alternatives, risks, complications.  -X-rays obtained reviewed.  No evidence of acute fracture. -CBC, BMP, uric acid level checked. -Patient wants to proceed with a steroid injection.  Injection was performed into the medial ankle joint.  Skin was prepped with Betadine and mixture of 1 cc Kenalog 10, 0.5 cc of Marcaine plain, 0.5 cc of lidocaine plain was infiltrated into the intermedial ankle joint without complications.  Postinjection care discussed. -Continue with using crutches that he presented with today to help with the pressure.  Nothing in the next day or 2 to let me know. -Given concern for reoccurrence of gout if this continues recommend rheumatology evaluation. -Patient encouraged to call the office with any questions, concerns, change in symptoms.   Trula Slade DPM

## 2021-07-08 ENCOUNTER — Telehealth: Payer: Self-pay | Admitting: *Deleted

## 2021-07-08 NOTE — Telephone Encounter (Signed)
Patient is having a flare up again,has an appointment 07/12/21.

## 2021-07-12 ENCOUNTER — Ambulatory Visit: Payer: HMO | Admitting: Podiatry

## 2021-07-12 ENCOUNTER — Other Ambulatory Visit: Payer: Self-pay

## 2021-07-12 DIAGNOSIS — M7989 Other specified soft tissue disorders: Secondary | ICD-10-CM | POA: Diagnosis not present

## 2021-07-12 DIAGNOSIS — M7752 Other enthesopathy of left foot: Secondary | ICD-10-CM | POA: Diagnosis not present

## 2021-07-12 DIAGNOSIS — M10071 Idiopathic gout, right ankle and foot: Secondary | ICD-10-CM

## 2021-07-12 MED ORDER — TRIAMCINOLONE ACETONIDE 10 MG/ML IJ SUSP
10.0000 mg | Freq: Once | INTRAMUSCULAR | Status: AC
  Administered 2021-07-12: 10 mg

## 2021-07-12 NOTE — Patient Instructions (Signed)

## 2021-07-15 NOTE — Progress Notes (Signed)
Subjective: 71 year old male presents the office today for concerns of possible reoccurrence of gout which started last week.  He points along the lateral aspect ankle is having discomfort.  He is still on allopurinol 200 mg daily.   Of note he brings up that he did previously have surgery to the ankle many years ago for cyst in the same area.  Denies any fevers or chills.  No other concerns.  Objective: AAO x3, NAD DP/PT pulses palpable bilaterally, CRT less than 3 seconds On the medial aspect of the ankle on the course of the anteromedial ankle joint as well as the flexor tendon there is localized edema and erythema with tenderness palpation on the anterior medial, flexor tendons.  He is able to move the ankle joint.  There is no skin breakdown.  There is no fluctuation crepitation. No pain with calf compression, swelling, warmth, erythema  Assessment: Capsulitis left ankle, likely gout  Plan: -All treatment options discussed with the patient including all alternatives, risks, complications.  -Steroid injection performed to the anterior lateral ankle joint.  Skin was prepped with Betadine and a mixture of 1 cc Kenalog 10, 0.5 cc of Marcaine plain, 0.5 cc of lidocaine plain was infiltrated into the anterolateral ankle joint in complications.  Postinjection care discussed. -Given ongoing reoccurrence would order an MRI for further evaluation to make sure there is no recurrence of the cyst or any other underlying pathology which can contribute to the recurrence of symptoms. -Given concern for reoccurrence of gout if this continues recommend rheumatology evaluation. -Patient encouraged to call the office with any questions, concerns, change in symptoms.   Trula Slade DPM

## 2021-07-21 ENCOUNTER — Ambulatory Visit
Admission: RE | Admit: 2021-07-21 | Discharge: 2021-07-21 | Disposition: A | Discharge status: Home / Self Care | Payer: HMO | Source: Ambulatory Visit | Attending: Podiatry | Admitting: Podiatry

## 2021-07-21 ENCOUNTER — Other Ambulatory Visit: Payer: Self-pay

## 2021-07-21 DIAGNOSIS — S93422A Sprain of deltoid ligament of left ankle, initial encounter: Secondary | ICD-10-CM | POA: Diagnosis not present

## 2021-07-21 DIAGNOSIS — M7752 Other enthesopathy of left foot: Secondary | ICD-10-CM

## 2021-07-22 ENCOUNTER — Telehealth: Payer: Self-pay | Admitting: *Deleted

## 2021-07-22 ENCOUNTER — Other Ambulatory Visit: Payer: Self-pay | Admitting: Podiatry

## 2021-07-22 DIAGNOSIS — M1000 Idiopathic gout, unspecified site: Secondary | ICD-10-CM

## 2021-07-22 NOTE — Telephone Encounter (Signed)
Patient is calling for status of MRI results. Please advise.

## 2021-07-23 NOTE — Addendum Note (Signed)
Addended by: Celesta Gentile R on: 07/23/2021 07:23 AM   Modules accepted: Orders

## 2021-07-23 NOTE — Telephone Encounter (Signed)
Faxed referral to [facility], received confirmation-07/23/21

## 2021-07-23 NOTE — Progress Notes (Signed)
Faxed referral 07/23/21

## 2021-08-01 ENCOUNTER — Other Ambulatory Visit: Payer: Self-pay | Admitting: Rheumatology

## 2021-08-01 DIAGNOSIS — M7989 Other specified soft tissue disorders: Secondary | ICD-10-CM | POA: Diagnosis not present

## 2021-08-01 DIAGNOSIS — M79642 Pain in left hand: Secondary | ICD-10-CM | POA: Diagnosis not present

## 2021-08-01 DIAGNOSIS — E1169 Type 2 diabetes mellitus with other specified complication: Secondary | ICD-10-CM | POA: Diagnosis not present

## 2021-08-01 DIAGNOSIS — M79641 Pain in right hand: Secondary | ICD-10-CM | POA: Diagnosis not present

## 2021-08-01 DIAGNOSIS — M25572 Pain in left ankle and joints of left foot: Secondary | ICD-10-CM

## 2021-08-01 DIAGNOSIS — M25472 Effusion, left ankle: Secondary | ICD-10-CM | POA: Diagnosis not present

## 2021-08-01 DIAGNOSIS — M2550 Pain in unspecified joint: Secondary | ICD-10-CM | POA: Diagnosis not present

## 2021-08-01 DIAGNOSIS — M79671 Pain in right foot: Secondary | ICD-10-CM | POA: Diagnosis not present

## 2021-08-01 DIAGNOSIS — N189 Chronic kidney disease, unspecified: Secondary | ICD-10-CM | POA: Diagnosis not present

## 2021-08-01 DIAGNOSIS — M109 Gout, unspecified: Secondary | ICD-10-CM | POA: Diagnosis not present

## 2021-08-01 DIAGNOSIS — M79672 Pain in left foot: Secondary | ICD-10-CM | POA: Diagnosis not present

## 2021-08-08 DIAGNOSIS — M2550 Pain in unspecified joint: Secondary | ICD-10-CM | POA: Diagnosis not present

## 2021-08-19 DIAGNOSIS — E1169 Type 2 diabetes mellitus with other specified complication: Secondary | ICD-10-CM | POA: Diagnosis not present

## 2021-08-19 DIAGNOSIS — N1831 Chronic kidney disease, stage 3a: Secondary | ICD-10-CM | POA: Diagnosis not present

## 2021-08-19 DIAGNOSIS — M25572 Pain in left ankle and joints of left foot: Secondary | ICD-10-CM | POA: Diagnosis not present

## 2021-08-19 DIAGNOSIS — M109 Gout, unspecified: Secondary | ICD-10-CM | POA: Diagnosis not present

## 2021-08-19 DIAGNOSIS — M459 Ankylosing spondylitis of unspecified sites in spine: Secondary | ICD-10-CM | POA: Diagnosis not present

## 2021-08-19 DIAGNOSIS — Z79899 Other long term (current) drug therapy: Secondary | ICD-10-CM | POA: Diagnosis not present

## 2021-08-19 DIAGNOSIS — M25472 Effusion, left ankle: Secondary | ICD-10-CM | POA: Diagnosis not present

## 2021-09-03 DIAGNOSIS — N1832 Chronic kidney disease, stage 3b: Secondary | ICD-10-CM | POA: Diagnosis not present

## 2021-09-03 DIAGNOSIS — Z6841 Body Mass Index (BMI) 40.0 and over, adult: Secondary | ICD-10-CM | POA: Diagnosis not present

## 2021-09-03 DIAGNOSIS — Z9189 Other specified personal risk factors, not elsewhere classified: Secondary | ICD-10-CM | POA: Diagnosis not present

## 2021-09-03 DIAGNOSIS — G720 Drug-induced myopathy: Secondary | ICD-10-CM | POA: Diagnosis not present

## 2021-09-03 DIAGNOSIS — M459 Ankylosing spondylitis of unspecified sites in spine: Secondary | ICD-10-CM | POA: Diagnosis not present

## 2021-09-03 DIAGNOSIS — Z0000 Encounter for general adult medical examination without abnormal findings: Secondary | ICD-10-CM | POA: Diagnosis not present

## 2021-09-03 DIAGNOSIS — E1142 Type 2 diabetes mellitus with diabetic polyneuropathy: Secondary | ICD-10-CM | POA: Diagnosis not present

## 2021-09-03 DIAGNOSIS — I10 Essential (primary) hypertension: Secondary | ICD-10-CM | POA: Diagnosis not present

## 2021-09-03 DIAGNOSIS — E785 Hyperlipidemia, unspecified: Secondary | ICD-10-CM | POA: Diagnosis not present

## 2021-09-24 DIAGNOSIS — E1169 Type 2 diabetes mellitus with other specified complication: Secondary | ICD-10-CM | POA: Diagnosis not present

## 2021-09-24 DIAGNOSIS — Z79899 Other long term (current) drug therapy: Secondary | ICD-10-CM | POA: Diagnosis not present

## 2021-09-24 DIAGNOSIS — M109 Gout, unspecified: Secondary | ICD-10-CM | POA: Diagnosis not present

## 2021-09-24 DIAGNOSIS — M25472 Effusion, left ankle: Secondary | ICD-10-CM | POA: Diagnosis not present

## 2021-09-24 DIAGNOSIS — N1831 Chronic kidney disease, stage 3a: Secondary | ICD-10-CM | POA: Diagnosis not present

## 2021-09-24 DIAGNOSIS — M25572 Pain in left ankle and joints of left foot: Secondary | ICD-10-CM | POA: Diagnosis not present

## 2021-09-24 DIAGNOSIS — M459 Ankylosing spondylitis of unspecified sites in spine: Secondary | ICD-10-CM | POA: Diagnosis not present

## 2021-09-25 DIAGNOSIS — N400 Enlarged prostate without lower urinary tract symptoms: Secondary | ICD-10-CM | POA: Diagnosis not present

## 2021-09-25 DIAGNOSIS — N5201 Erectile dysfunction due to arterial insufficiency: Secondary | ICD-10-CM | POA: Diagnosis not present

## 2021-10-10 ENCOUNTER — Other Ambulatory Visit: Payer: Self-pay | Admitting: Podiatry

## 2021-10-17 DIAGNOSIS — J209 Acute bronchitis, unspecified: Secondary | ICD-10-CM | POA: Diagnosis not present

## 2021-10-17 DIAGNOSIS — R051 Acute cough: Secondary | ICD-10-CM | POA: Diagnosis not present

## 2022-01-01 DIAGNOSIS — D0461 Carcinoma in situ of skin of right upper limb, including shoulder: Secondary | ICD-10-CM | POA: Diagnosis not present

## 2022-01-01 DIAGNOSIS — Z85828 Personal history of other malignant neoplasm of skin: Secondary | ICD-10-CM | POA: Diagnosis not present

## 2022-01-01 DIAGNOSIS — L820 Inflamed seborrheic keratosis: Secondary | ICD-10-CM | POA: Diagnosis not present

## 2022-01-01 DIAGNOSIS — L821 Other seborrheic keratosis: Secondary | ICD-10-CM | POA: Diagnosis not present

## 2022-01-02 DIAGNOSIS — M459 Ankylosing spondylitis of unspecified sites in spine: Secondary | ICD-10-CM | POA: Diagnosis not present

## 2022-01-02 DIAGNOSIS — M25572 Pain in left ankle and joints of left foot: Secondary | ICD-10-CM | POA: Diagnosis not present

## 2022-01-02 DIAGNOSIS — Z79899 Other long term (current) drug therapy: Secondary | ICD-10-CM | POA: Diagnosis not present

## 2022-01-02 DIAGNOSIS — N1831 Chronic kidney disease, stage 3a: Secondary | ICD-10-CM | POA: Diagnosis not present

## 2022-01-02 DIAGNOSIS — M109 Gout, unspecified: Secondary | ICD-10-CM | POA: Diagnosis not present

## 2022-01-02 DIAGNOSIS — E1169 Type 2 diabetes mellitus with other specified complication: Secondary | ICD-10-CM | POA: Diagnosis not present

## 2022-01-02 DIAGNOSIS — M25472 Effusion, left ankle: Secondary | ICD-10-CM | POA: Diagnosis not present

## 2022-01-13 ENCOUNTER — Other Ambulatory Visit: Payer: Self-pay | Admitting: Family Medicine

## 2022-01-13 ENCOUNTER — Ambulatory Visit
Admission: RE | Admit: 2022-01-13 | Discharge: 2022-01-13 | Disposition: A | Discharge status: Home / Self Care | Payer: HMO | Source: Ambulatory Visit | Attending: Family Medicine | Admitting: Family Medicine

## 2022-01-13 DIAGNOSIS — R918 Other nonspecific abnormal finding of lung field: Secondary | ICD-10-CM

## 2022-01-13 DIAGNOSIS — M546 Pain in thoracic spine: Secondary | ICD-10-CM | POA: Diagnosis not present

## 2022-01-13 DIAGNOSIS — I700 Atherosclerosis of aorta: Secondary | ICD-10-CM | POA: Diagnosis not present

## 2022-01-23 DIAGNOSIS — E1165 Type 2 diabetes mellitus with hyperglycemia: Secondary | ICD-10-CM | POA: Diagnosis not present

## 2022-01-23 DIAGNOSIS — Z794 Long term (current) use of insulin: Secondary | ICD-10-CM | POA: Diagnosis not present

## 2022-01-23 DIAGNOSIS — N1832 Chronic kidney disease, stage 3b: Secondary | ICD-10-CM | POA: Diagnosis not present

## 2022-01-23 DIAGNOSIS — E669 Obesity, unspecified: Secondary | ICD-10-CM | POA: Diagnosis not present

## 2022-01-23 DIAGNOSIS — I10 Essential (primary) hypertension: Secondary | ICD-10-CM | POA: Diagnosis not present

## 2022-01-23 DIAGNOSIS — E1142 Type 2 diabetes mellitus with diabetic polyneuropathy: Secondary | ICD-10-CM | POA: Diagnosis not present

## 2022-02-08 ENCOUNTER — Other Ambulatory Visit: Payer: Self-pay | Admitting: Cardiology

## 2022-02-21 ENCOUNTER — Other Ambulatory Visit: Payer: Self-pay | Admitting: Cardiology

## 2022-02-26 ENCOUNTER — Telehealth: Payer: Self-pay | Admitting: Cardiology

## 2022-02-26 MED ORDER — ROSUVASTATIN CALCIUM 20 MG PO TABS: 20.0000 mg | ORAL_TABLET | Freq: Every day | ORAL | 1 refills | Status: DC

## 2022-02-26 NOTE — Telephone Encounter (Signed)
*  STAT* If patient is at the pharmacy, call can be transferred to refill team.   1. Which medications need to be refilled? (please list name of each medication and dose if known) rosuvastatin (CRESTOR) 20 MG tablet  2. Which pharmacy/location (including street and city if local pharmacy) is medication to be sent to? [id number] [place], [place] - [address] AT [address]. Do they need a 30 day or 90 day supply? 90   Patient has appt for 04/24/22

## 2022-02-26 NOTE — Telephone Encounter (Signed)
Pt scheduled to see Dr. Marlou Porch, 04/24/22.  30 day refill X's 1 of [facility]uvastatin [facility] to [facility] [address]

## 2022-03-10 DIAGNOSIS — Z5321 Procedure and treatment not carried out due to patient leaving prior to being seen by health care provider: Secondary | ICD-10-CM | POA: Insufficient documentation

## 2022-03-10 DIAGNOSIS — R0789 Other chest pain: Secondary | ICD-10-CM | POA: Diagnosis not present

## 2022-03-10 DIAGNOSIS — R079 Chest pain, unspecified: Secondary | ICD-10-CM | POA: Diagnosis not present

## 2022-03-11 ENCOUNTER — Emergency Department (HOSPITAL_COMMUNITY): Payer: HMO

## 2022-03-11 ENCOUNTER — Encounter (HOSPITAL_COMMUNITY): Payer: Self-pay | Admitting: Emergency Medicine

## 2022-03-11 ENCOUNTER — Emergency Department (HOSPITAL_COMMUNITY)
Admission: EM | Admit: 2022-03-11 | Discharge: 2022-03-11 | Discharge status: Against Medical Advice | Payer: HMO | Attending: Emergency Medicine | Admitting: Emergency Medicine

## 2022-03-11 ENCOUNTER — Other Ambulatory Visit: Payer: Self-pay

## 2022-03-11 DIAGNOSIS — R079 Chest pain, unspecified: Secondary | ICD-10-CM | POA: Diagnosis not present

## 2022-03-11 LAB — BASIC METABOLIC PANEL
Anion gap: 11 (ref 5–15)
BUN: 19 mg/dL (ref 8–23)
CO2: 27 mmol/L (ref 22–32)
Calcium: 9.5 mg/dL (ref 8.9–10.3)
Chloride: 100 mmol/L (ref 98–111)
Creatinine, Ser: 1.39 mg/dL — ABNORMAL HIGH (ref 0.61–1.24)
GFR, Estimated: 54 mL/min — ABNORMAL LOW (ref >60)
Glucose, Bld: 255 mg/dL — ABNORMAL HIGH (ref 70–99)
Potassium: 4.6 mmol/L (ref 3.5–5.1)
Sodium: 138 mmol/L (ref 135–145)

## 2022-03-11 LAB — CBC WITH DIFFERENTIAL/PLATELET
Abs Immature Granulocytes: 0.05 10*3/uL (ref 0.00–0.07)
Basophils Absolute: 0.1 10*3/uL (ref 0.0–0.1)
Basophils Relative: 1 %
Eosinophils Absolute: 0.4 10*3/uL (ref 0.0–0.5)
Eosinophils Relative: 4 %
HCT: 44.8 % (ref 39.0–52.0)
Hemoglobin: 14.4 g/dL (ref 13.0–17.0)
Immature Granulocytes: 0 %
Lymphocytes Relative: 26 %
Lymphs Abs: 2.9 10*3/uL (ref 0.7–4.0)
MCH: 29.3 pg (ref 26.0–34.0)
MCHC: 32.1 g/dL (ref 30.0–36.0)
MCV: 91.1 fL (ref 80.0–100.0)
Monocytes Absolute: 1 10*3/uL (ref 0.1–1.0)
Monocytes Relative: 9 %
Neutro Abs: 6.7 10*3/uL (ref 1.7–7.7)
Neutrophils Relative %: 60 %
Platelets: 261 10*3/uL (ref 150–400)
RBC: 4.92 MIL/uL (ref 4.22–5.81)
RDW: 13.4 % (ref 11.5–15.5)
WBC: 11.1 10*3/uL — ABNORMAL HIGH (ref 4.0–10.5)
nRBC: 0 % (ref 0.0–0.2)

## 2022-03-11 LAB — TROPONIN I (HIGH SENSITIVITY): Troponin I (High Sensitivity): 10 ng/L (ref <18)

## 2022-03-11 NOTE — ED Provider Triage Note (Signed)
Emergency Medicine Provider Triage Evaluation Note  Steven Mcclure , a 71 y.o. male  was evaluated in triage.  Pt complains of chest pain that began 2 hours ago. Denies SOB, diaphoresis, nausea, vomiting.  Had coronary CT and cath about 1 year ago that was normal per patient.  Took TUMS at home, pain nearly resolved now.  Review of Systems  Positive: Chest pain Negative: fever  Physical Exam  BP (!) 154/69 (BP Location: Right Arm)   Pulse 88   Temp 98.7 F (37.1 C) (Oral)   Resp 16   SpO2 95%  Gen:   Awake, no distress   Resp:  Normal effort  MSK:   Moves extremities without difficulty  Other:    Medical Decision Making  Medically screening exam initiated at 12:25 AM.  Appropriate orders placed.  Lynn Sissel was informed that the remainder of the evaluation will be completed by another provider, this initial triage assessment does not replace that evaluation, and the importance of remaining in the ED until their evaluation is complete.  Chest pain.  Better after TUMS PTA.  Coronary CT and cath 1 year ago, non-obstructive disease noted.  EKG, labs, CXR.   Larene Pickett, PA-C 03/11/22 [phone]

## 2022-03-11 NOTE — ED Triage Notes (Signed)
Pt reports center chest pain x 2 hours ago, denies n/v/d/cardiac hx; reports took Tums with some relief

## 2022-03-11 NOTE — ED Notes (Signed)
CALLED X4 NO ANSWER

## 2022-03-31 ENCOUNTER — Other Ambulatory Visit: Payer: Self-pay | Admitting: Cardiology

## 2022-04-02 ENCOUNTER — Other Ambulatory Visit: Payer: Self-pay | Admitting: Family Medicine

## 2022-04-02 ENCOUNTER — Ambulatory Visit
Admission: RE | Admit: 2022-04-02 | Discharge: 2022-04-02 | Disposition: A | Discharge status: Home / Self Care | Payer: HMO | Source: Ambulatory Visit | Attending: Family Medicine | Admitting: Family Medicine

## 2022-04-02 DIAGNOSIS — R918 Other nonspecific abnormal finding of lung field: Secondary | ICD-10-CM

## 2022-04-03 ENCOUNTER — Encounter: Payer: Self-pay | Admitting: Cardiology

## 2022-04-03 ENCOUNTER — Ambulatory Visit: Payer: HMO | Attending: Cardiology | Admitting: Cardiology

## 2022-04-03 VITALS — BP 120/70 | HR 84 | Ht 70.0 in | Wt 259.0 lb

## 2022-04-03 DIAGNOSIS — I2510 Atherosclerotic heart disease of native coronary artery without angina pectoris: Secondary | ICD-10-CM

## 2022-04-03 DIAGNOSIS — E119 Type 2 diabetes mellitus without complications: Secondary | ICD-10-CM

## 2022-04-03 DIAGNOSIS — E7800 Pure hypercholesterolemia, unspecified: Secondary | ICD-10-CM | POA: Diagnosis not present

## 2022-04-03 DIAGNOSIS — I10 Essential (primary) hypertension: Secondary | ICD-10-CM

## 2022-04-03 MED ORDER — ROSUVASTATIN CALCIUM 20 MG PO TABS: 20.0000 mg | ORAL_TABLET | Freq: Every day | ORAL | 3 refills | Status: DC

## 2022-04-03 MED ORDER — LISINOPRIL 20 MG PO TABS: 20.0000 mg | ORAL_TABLET | Freq: Every day | ORAL | 3 refills | Status: DC

## 2022-04-03 NOTE — Patient Instructions (Signed)
Medication Instructions:  The current medical regimen is effective;  continue present plan and medications.  *If you need a refill on your cardiac medications before your next appointment, please call your pharmacy*  Follow-Up: At [facility], you and your health needs are our priority.  As part of our continuing mission to provide you with exceptional heart care, we have created designated Provider Care Teams.  These Care Teams include your primary Cardiologist (physician) and Advanced Practice Providers (APPs -  Physician Assistants and Nurse Practitioners) who all work together to provide you with the care you need, when you need it.  We recommend signing up for the patient portal called "MyChart".  Sign up information is provided on this After Visit Summary.  MyChart is used to connect with patients for Virtual Visits (Telemedicine).  Patients are able to view lab/test results, encounter notes, upcoming appointments, etc.  Non-urgent messages can be sent to your provider as well.   To learn more about what you can do with MyChart, go to [email or web address].    Your next appointment:   1 year(s)  The format for your next appointment:   In Person  Provider:   Dr Mark Skains  Important Information About Sugar       

## 2022-04-03 NOTE — Progress Notes (Signed)
Cardiology Office Note:    Date:  04/03/2022   ID:  Steven Mcclure, DOB Jul 13, 1950, MRN [id number]  PCP:  Glenis Smoker, MD   [facility] Providers Cardiologist:  Candee Furbish, MD     Referring MD: Glenis Smoker, *     History of Present Illness:    Steven Mcclure is a 71 y.o. male who presents for the follow up of coronary artery disease and elevated calcium score 2200 94th percentile, hypertension, hyperlipidemia.   He had a left heart catheter on 12/24/2020 which had no complications. Mild to moderate, scattered, nonobstructive CAD.  Continue preventive therapy.  Mid RCA lesion is 25% stenosed. 2nd Mrg lesion is 25% stenosed. 3rd Mrg lesion is 50% stenosed. Mid LAD lesion is 25% stenosed.  His brother has had bypass.  Another brother had PCI.  He has had diabetes for the past 20 years.  Morbid obesity.  Hyperlipidemia, hypertension.  Several years ago he did have atypical chest pain episode, some numbness in his left arm.  Ended up having a nuclear stress test which was unremarkable.  He has had occasional bouts of shortness of breath or dyspnea on exertion.  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  After his calcium score came back elevated, his primary care physician Dr. Lindell Noe increased his simvastatin.  He was changed over to Crestor 20 mg once a day for high intensity statin, newer generation with less potential drug interactions.  Today, the patient states that he has been feeling great. He states that he "doesn't know why he is here" because he is feeling so good. He has lost around 12 pounds (273-259) since the last visit and has been improving his diet.  He is a Psychiatrist, and he runs equipment sometimes but generally isn't very physically stressed. During work he bends over a lot and has to wear suspenders, but is generally managing and not physically doing the  Architect.  Road Architect.  He was at the hospital on the 3rd of October for chest pain which lasted for a long time, he had taken 4 or 5 Tums and drove to the hospital for his concerns. He states that they found out that it was indigestion and GERD due to a normal troponin level.  He has been compliant with his medication but only has a few medications left and needs to go pick up more  His dad died at 33 from diabetes complications.  He denies any palpitations, shortness of breath, or peripheral edema. No lightheadedness, headaches, syncope, orthopnea, or PND.    Past Medical History:  Diagnosis Date   Body mass index 40.0-44.9, adult (HCC)    CKD (chronic kidney disease), stage III (HCC)    Coronary atherosclerosis due to severely calcified coronary lesion    Diabetes mellitus without complication (HCC)    ED (erectile dysfunction)    Gout    History of kidney stones    Hx of colonic polyps    Hyperlipidemia    Hypertension    Long-term insulin use in type 2 diabetes ([place])    Low testosterone in male    Obesity    Vertigo    Vitamin B12 deficiency     Past Surgical History:  Procedure Laterality Date   KIDNEY STONE SURGERY     LEFT HEART CATH AND CORONARY ANGIOGRAPHY N/A 12/24/2020   Procedure: LEFT HEART CATH AND CORONARY ANGIOGRAPHY;  Surgeon:  Jettie Booze, MD;  Location: [place] [facility];  Service: Cardiovascular;  Laterality: N/A;    Current Medications: Current Meds  Medication Sig   acetaminophen (TYLENOL) 500 MG tablet Take 1,000 mg by mouth every 6 (six) hours as needed for moderate pain or headache.   albuterol (VENTOLIN HFA) 108 (90 Base) MCG/ACT inhaler Inhale 1-2 puffs into the lungs every 6 (six) hours as needed for shortness of breath or wheezing.   Allopurinol 200 MG TABS Take 200 mg by mouth daily.   aspirin EC 81 MG tablet Take 81 mg by mouth daily.   BD INSULIN SYRINGE U/F 31G X 5/16" 1 ML MISC    docusate sodium (COLACE) 50 MG  capsule Take 50 mg by mouth daily.   doxycycline (VIBRA-TABS) 100 MG tablet Take 1 tablet (100 mg total) by mouth 2 (two) times daily.   guaiFENesin-codeine 100-10 MG/5ML syrup Take 5 mLs by mouth every 6 (six) hours as needed for cough.   LANTUS 100 UNIT/ML injection Inject 90 Units into the skin daily.   loratadine (CLARITIN) 10 MG tablet Take 20 mg by mouth daily.   metFORMIN (GLUCOPHAGE) 1000 MG tablet Take 1 tablet (1,000 mg total) by mouth daily.   Multiple Vitamin (MULTIVITAMIN WITH MINERALS) TABS tablet Take 1 tablet by mouth daily.   Omega-3 Fatty Acids (FISH OIL) 1200 MG CAPS Take 1,200 mg by mouth daily.   OneTouch Delica Lancets 15Q MISC See admin instructions.   ONETOUCH VERIO test strip    TRULICITY 3 MG/8.6PY SOPN Inject 3 mg as directed every Friday.   vitamin B-12 (CYANOCOBALAMIN) 1000 MCG tablet Take 1,000 mcg by mouth daily.   [DISCONTINUED] lisinopril (ZESTRIL) 20 MG tablet Take 20 mg by mouth daily.   [DISCONTINUED] rosuvastatin (CRESTOR) 20 MG tablet TAKE 1 TABLET BY MOUTH EVERY DAY   Current Facility-Administered Medications for the 04/03/22 encounter (Office Visit) with Jerline Pain, MD  Medication   triamcinolone acetonide (KENALOG) 10 MG/ML injection 10 mg     Allergies:   Atorvastatin, Rosuvastatin, Hydrocod poli-chlorphe poli er, and Tussin cough [dextromethorphan hbr]   Social History   Socioeconomic History   Marital status: Widowed    Spouse name: Not on file   Number of children: Not on file   Years of education: Not on file   Highest education level: Not on file  Occupational History   Not on file  Tobacco Use   Smoking status: Former    Types: Cigarettes    Quit date: 36    Years since quitting: 44.8   Smokeless tobacco: Never  Vaping Use   Vaping Use: Never used  Substance and Sexual Activity   Alcohol use: Yes    Comment: social   Drug use: Never   Sexual activity: Not on file  Other Topics Concern   Not on file  Social History  Narrative   Not on file   Social Determinants of Health   Financial Resource Strain: Not on file  Food Insecurity: No Food Insecurity (05/21/2020)   Hunger Vital Sign    Worried About Running Out of Food in the Last Year: Never true    Ran Out of Food in the Last Year: Never true  Transportation Needs: No Transportation Needs (05/21/2020)   PRAPARE - Hydrologist (Medical): No    Lack of Transportation (Non-Medical): No  Physical Activity: Not on file  Stress: Not on file  Social Connections: Not on file  Family History: The patient's family history includes Diabetes in his father and mother.  His brothers have had coronary artery disease, CABG, PCI  ROS:   Please see the history of present illness.    (+) Chest pain (related to GERD) All other systems reviewed and are negative.  EKGs/Labs/Other Studies Reviewed:    The following studies were reviewed today: Left heart cath and coronary angiography 12/24/2020:    Mid RCA lesion is 25% stenosed.   2nd Mrg lesion is 25% stenosed.   3rd Mrg lesion is 50% stenosed.   Mid LAD lesion is 25% stenosed.   LV end diastolic pressure is normal.   There is no aortic valve stenosis.   Mild to moderate, scattered, nonobstructive CAD.  Continue preventive therapy.  Diagnostic Dominance: Right     Coronary calcium score 10/12/2020: Scan was personally reviewed and interpreted.  I showed him the images.  FINDINGS: Coronary arteries: Normal origins.   Coronary Calcium Score:   Left main: 154   Left anterior descending artery: 769   Left circumflex artery: 527   Right coronary artery: 771   Total: 2221   Percentile:   Pericardium: Normal.   Ascending Aorta: Normal caliber.     IMPRESSION: Coronary calcium score of 2221. This was 72 percentile for age-, race-, and sex-matched controls.  EKG:  The EKG is personally reviewed 04/03/2022: None performed  12/18/2020: EKG demonstrates sinus  rhythm with atrial bigeminy, PACs.  Recent Labs: 03/10/2022: BUN 19; Creatinine, Ser 1.39; Hemoglobin 14.4; Platelets 261; Potassium 4.6; Sodium 138  Recent Lipid Panel    Component Value Date/Time   CHOL  12/22/2009 0404    108        ATP III CLASSIFICATION:  <200     mg/dL   Desirable  200-239  mg/dL   Borderline High  >=240    mg/dL   High          TRIG 205 (H) 12/22/2009 0404   HDL 29 (L) 12/22/2009 0404   CHOLHDL 3.7 12/22/2009 0404   VLDL 41 (H) 12/22/2009 0404   LDLCALC  12/22/2009 0404    38        Total Cholesterol/HDL:CHD Risk Coronary Heart Disease Risk Table                     Men   Women  1/2 Average Risk   3.4   3.3  Average Risk       5.0   4.4  2 X Average Risk   9.6   7.1  3 X Average Risk  23.4   11.0        Use the calculated Patient Ratio above and the CHD Risk Table to determine the patient's CHD Risk.        ATP III CLASSIFICATION (LDL):  <100     mg/dL   Optimal  100-129  mg/dL   Near or Above                    Optimal  130-159  mg/dL   Borderline  160-189  mg/dL   High  >190     mg/dL   Very High     Risk Assessment/Calculations:          Physical Exam:    VS:  BP 120/70 (BP Location: Left Arm, Patient Position: Sitting, Cuff Size: Normal)   Pulse 84   Ht '5\' 10"'$  (1.778 m)   Wt 259 lb (117.5  kg)   SpO2 94%   BMI 37.16 kg/m     Wt Readings from Last 3 Encounters:  04/03/22 259 lb (117.5 kg)  03/11/22 273 lb (123.8 kg)  12/24/20 273 lb (123.8 kg)     GEN:  Well nourished, well developed in no acute distress HEENT: Normal NECK: No JVD; No carotid bruits LYMPHATICS: No lymphadenopathy CARDIAC:  RRR with occasional ectopy, no murmurs, rubs, gallops RESPIRATORY:  Clear to auscultation without rales, wheezing or rhonchi  ABDOMEN: Soft, non-tender, non-distended MUSCULOSKELETAL:  No edema; No deformity  SKIN: Warm and dry NEUROLOGIC:  Alert and oriented x 3 PSYCHIATRIC:  Normal affect   ASSESSMENT:    1. Coronary artery  disease involving native coronary artery of native heart without angina pectoris   2. Diabetes mellitus with coincident hypertension ([place])   3. Pure hypercholesterolemia     PLAN:    In order of problems listed above:  Coronary artery disease/highly elevated coronary calcium of 2200, 94th percentile with diabetes hypertension hyperlipidemia and brothers with bypass and PCI's -Cardiac catheterization as above shows no obstructive CAD.  Continue with goal-directed medical therapy.  We are on Zetia Crestor and aspirin 81 mg.  No changes made to medical management.  Family history of coronary artery disease - Both of his brothers had significant CAD 1 of which had bypass surgery.  Diabetes with hyperlipidemia -Excellent LDL of 31 now with Zetia and Crestor.  Prevention.  Essential hypertension - On lisinopril.  Last creatinine 1.3.  Stable.  GERD - Had an episode of significant heartburn woke him up from sleep.  Troponin was normal.  Normal EKG.  ER note reviewed.  He had just had a heart catheterization that was nonobstructive.  Follow Up: 1 year.  Medication Adjustments/Labs and Tests Ordered: Current medicines are reviewed at length with the patient today.  Concerns regarding medicines are outlined above.  No orders of the defined types were placed in this encounter.  Meds ordered this encounter  Medications   lisinopril (ZESTRIL) 20 MG tablet    Sig: Take 1 tablet (20 mg total) by mouth daily.    Dispense:  90 tablet    Refill:  3   rosuvastatin (CRESTOR) 20 MG tablet    Sig: Take 1 tablet (20 mg total) by mouth daily.    Dispense:  90 tablet    Refill:  3    Patient Instructions  Medication Instructions:  The current medical regimen is effective;  continue present plan and medications.  *If you need a refill on your cardiac medications before your next appointment, please call your pharmacy*  Follow-Up: At [facility], you and your health needs are our  priority.  As part of our continuing mission to provide you with exceptional heart care, we have created designated Provider Care Teams.  These Care Teams include your primary Cardiologist (physician) and Advanced Practice Providers (APPs -  Physician Assistants and Nurse Practitioners) who all work together to provide you with the care you need, when you need it.  We recommend signing up for the patient portal called "MyChart".  Sign up information is provided on this After Visit Summary.  MyChart is used to connect with patients for Virtual Visits (Telemedicine).  Patients are able to view lab/test results, encounter notes, upcoming appointments, etc.  Non-urgent messages can be sent to your provider as well.   To learn more about what you can do with MyChart, go to [email or web address].    Your next appointment:  1 year(s)  The format for your next appointment:   In Person  Provider:   Dr Candee Furbish      Important Information About Sugar          I,Coren O'Brien,acting as a scribe for Candee Furbish, MD.,have documented all relevant documentation on the behalf of Candee Furbish, MD,as directed by  Candee Furbish, MD while in the presence of Candee Furbish, MD.  I, Candee Furbish, MD, have reviewed all documentation for this visit. The documentation on 04/03/22 for the exam, diagnosis, procedures, and orders are all accurate and complete.   Signed, Candee Furbish, MD  04/03/2022 11:21 AM    [facility]

## 2022-04-24 ENCOUNTER — Ambulatory Visit: Payer: HMO | Admitting: Cardiology

## 2022-06-23 DIAGNOSIS — N183 Chronic kidney disease, stage 3 unspecified: Secondary | ICD-10-CM | POA: Diagnosis not present

## 2022-06-27 DIAGNOSIS — I129 Hypertensive chronic kidney disease with stage 1 through stage 4 chronic kidney disease, or unspecified chronic kidney disease: Secondary | ICD-10-CM | POA: Diagnosis not present

## 2022-06-27 DIAGNOSIS — N183 Chronic kidney disease, stage 3 unspecified: Secondary | ICD-10-CM | POA: Diagnosis not present

## 2022-07-08 DIAGNOSIS — N1831 Chronic kidney disease, stage 3a: Secondary | ICD-10-CM | POA: Diagnosis not present

## 2022-07-08 DIAGNOSIS — M459 Ankylosing spondylitis of unspecified sites in spine: Secondary | ICD-10-CM | POA: Diagnosis not present

## 2022-07-08 DIAGNOSIS — M109 Gout, unspecified: Secondary | ICD-10-CM | POA: Diagnosis not present

## 2022-07-08 DIAGNOSIS — Z79899 Other long term (current) drug therapy: Secondary | ICD-10-CM | POA: Diagnosis not present

## 2022-07-08 DIAGNOSIS — M25472 Effusion, left ankle: Secondary | ICD-10-CM | POA: Diagnosis not present

## 2022-07-08 DIAGNOSIS — M25572 Pain in left ankle and joints of left foot: Secondary | ICD-10-CM | POA: Diagnosis not present

## 2022-07-08 DIAGNOSIS — R748 Abnormal levels of other serum enzymes: Secondary | ICD-10-CM | POA: Diagnosis not present

## 2022-07-08 DIAGNOSIS — E1169 Type 2 diabetes mellitus with other specified complication: Secondary | ICD-10-CM | POA: Diagnosis not present

## 2022-07-08 DIAGNOSIS — M79672 Pain in left foot: Secondary | ICD-10-CM | POA: Diagnosis not present

## 2022-07-10 DIAGNOSIS — C44222 Squamous cell carcinoma of skin of right ear and external auricular canal: Secondary | ICD-10-CM | POA: Diagnosis not present

## 2022-07-10 DIAGNOSIS — C44121 Squamous cell carcinoma of skin of unspecified eyelid, including canthus: Secondary | ICD-10-CM | POA: Diagnosis not present

## 2022-07-10 DIAGNOSIS — Z85828 Personal history of other malignant neoplasm of skin: Secondary | ICD-10-CM | POA: Diagnosis not present

## 2022-07-10 DIAGNOSIS — C44319 Basal cell carcinoma of skin of other parts of face: Secondary | ICD-10-CM | POA: Diagnosis not present

## 2022-07-10 DIAGNOSIS — D0462 Carcinoma in situ of skin of left upper limb, including shoulder: Secondary | ICD-10-CM | POA: Diagnosis not present

## 2022-07-10 DIAGNOSIS — D225 Melanocytic nevi of trunk: Secondary | ICD-10-CM | POA: Diagnosis not present

## 2022-07-10 DIAGNOSIS — D692 Other nonthrombocytopenic purpura: Secondary | ICD-10-CM | POA: Diagnosis not present

## 2022-07-10 DIAGNOSIS — D2371 Other benign neoplasm of skin of right lower limb, including hip: Secondary | ICD-10-CM | POA: Diagnosis not present

## 2022-07-10 DIAGNOSIS — D0461 Carcinoma in situ of skin of right upper limb, including shoulder: Secondary | ICD-10-CM | POA: Diagnosis not present

## 2022-07-10 DIAGNOSIS — C44629 Squamous cell carcinoma of skin of left upper limb, including shoulder: Secondary | ICD-10-CM | POA: Diagnosis not present

## 2022-07-10 DIAGNOSIS — L814 Other melanin hyperpigmentation: Secondary | ICD-10-CM | POA: Diagnosis not present

## 2022-07-10 DIAGNOSIS — D2262 Melanocytic nevi of left upper limb, including shoulder: Secondary | ICD-10-CM | POA: Diagnosis not present

## 2022-07-10 DIAGNOSIS — C44329 Squamous cell carcinoma of skin of other parts of face: Secondary | ICD-10-CM | POA: Diagnosis not present

## 2022-07-10 DIAGNOSIS — L570 Actinic keratosis: Secondary | ICD-10-CM | POA: Diagnosis not present

## 2022-07-16 DIAGNOSIS — K8020 Calculus of gallbladder without cholecystitis without obstruction: Secondary | ICD-10-CM | POA: Diagnosis not present

## 2022-07-16 DIAGNOSIS — R7401 Elevation of levels of liver transaminase levels: Secondary | ICD-10-CM | POA: Diagnosis not present

## 2022-07-16 DIAGNOSIS — K760 Fatty (change of) liver, not elsewhere classified: Secondary | ICD-10-CM | POA: Diagnosis not present

## 2022-07-17 DIAGNOSIS — I2584 Coronary atherosclerosis due to calcified coronary lesion: Secondary | ICD-10-CM | POA: Diagnosis not present

## 2022-07-17 DIAGNOSIS — E785 Hyperlipidemia, unspecified: Secondary | ICD-10-CM | POA: Diagnosis not present

## 2022-07-17 DIAGNOSIS — E1142 Type 2 diabetes mellitus with diabetic polyneuropathy: Secondary | ICD-10-CM | POA: Diagnosis not present

## 2022-07-17 DIAGNOSIS — I10 Essential (primary) hypertension: Secondary | ICD-10-CM | POA: Diagnosis not present

## 2022-07-17 DIAGNOSIS — N1832 Chronic kidney disease, stage 3b: Secondary | ICD-10-CM | POA: Diagnosis not present

## 2022-07-24 DIAGNOSIS — E119 Type 2 diabetes mellitus without complications: Secondary | ICD-10-CM | POA: Diagnosis not present

## 2022-07-24 DIAGNOSIS — H2513 Age-related nuclear cataract, bilateral: Secondary | ICD-10-CM | POA: Diagnosis not present

## 2022-07-24 DIAGNOSIS — H524 Presbyopia: Secondary | ICD-10-CM | POA: Diagnosis not present

## 2022-07-24 DIAGNOSIS — H52223 Regular astigmatism, bilateral: Secondary | ICD-10-CM | POA: Diagnosis not present

## 2022-07-24 DIAGNOSIS — H5203 Hypermetropia, bilateral: Secondary | ICD-10-CM | POA: Diagnosis not present

## 2022-07-24 DIAGNOSIS — Z135 Encounter for screening for eye and ear disorders: Secondary | ICD-10-CM | POA: Diagnosis not present

## 2022-07-29 DIAGNOSIS — Z79624 Long term (current) use of inhibitors of nucleotide synthesis: Secondary | ICD-10-CM | POA: Diagnosis not present

## 2022-07-29 DIAGNOSIS — E1165 Type 2 diabetes mellitus with hyperglycemia: Secondary | ICD-10-CM | POA: Diagnosis not present

## 2022-07-29 DIAGNOSIS — M459 Ankylosing spondylitis of unspecified sites in spine: Secondary | ICD-10-CM | POA: Diagnosis not present

## 2022-07-29 DIAGNOSIS — D84821 Immunodeficiency due to drugs: Secondary | ICD-10-CM | POA: Diagnosis not present

## 2022-07-29 DIAGNOSIS — E1142 Type 2 diabetes mellitus with diabetic polyneuropathy: Secondary | ICD-10-CM | POA: Diagnosis not present

## 2022-07-29 DIAGNOSIS — E1122 Type 2 diabetes mellitus with diabetic chronic kidney disease: Secondary | ICD-10-CM | POA: Diagnosis not present

## 2022-07-29 DIAGNOSIS — K760 Fatty (change of) liver, not elsewhere classified: Secondary | ICD-10-CM | POA: Diagnosis not present

## 2022-07-29 DIAGNOSIS — N1832 Chronic kidney disease, stage 3b: Secondary | ICD-10-CM | POA: Diagnosis not present

## 2022-08-19 DIAGNOSIS — N1831 Chronic kidney disease, stage 3a: Secondary | ICD-10-CM | POA: Diagnosis not present

## 2022-08-19 DIAGNOSIS — K760 Fatty (change of) liver, not elsewhere classified: Secondary | ICD-10-CM | POA: Diagnosis not present

## 2022-08-19 DIAGNOSIS — Z79899 Other long term (current) drug therapy: Secondary | ICD-10-CM | POA: Diagnosis not present

## 2022-08-19 DIAGNOSIS — M109 Gout, unspecified: Secondary | ICD-10-CM | POA: Diagnosis not present

## 2022-08-19 DIAGNOSIS — M25472 Effusion, left ankle: Secondary | ICD-10-CM | POA: Diagnosis not present

## 2022-08-19 DIAGNOSIS — M25572 Pain in left ankle and joints of left foot: Secondary | ICD-10-CM | POA: Diagnosis not present

## 2022-08-19 DIAGNOSIS — M459 Ankylosing spondylitis of unspecified sites in spine: Secondary | ICD-10-CM | POA: Diagnosis not present

## 2022-08-19 DIAGNOSIS — M79671 Pain in right foot: Secondary | ICD-10-CM | POA: Diagnosis not present

## 2022-08-19 DIAGNOSIS — E1169 Type 2 diabetes mellitus with other specified complication: Secondary | ICD-10-CM | POA: Diagnosis not present

## 2022-08-19 DIAGNOSIS — R748 Abnormal levels of other serum enzymes: Secondary | ICD-10-CM | POA: Diagnosis not present

## 2022-08-21 DIAGNOSIS — Z85828 Personal history of other malignant neoplasm of skin: Secondary | ICD-10-CM | POA: Diagnosis not present

## 2022-08-21 DIAGNOSIS — C44222 Squamous cell carcinoma of skin of right ear and external auricular canal: Secondary | ICD-10-CM | POA: Diagnosis not present

## 2022-09-09 DIAGNOSIS — Z9189 Other specified personal risk factors, not elsewhere classified: Secondary | ICD-10-CM | POA: Diagnosis not present

## 2022-09-09 DIAGNOSIS — E1122 Type 2 diabetes mellitus with diabetic chronic kidney disease: Secondary | ICD-10-CM | POA: Diagnosis not present

## 2022-09-09 DIAGNOSIS — I10 Essential (primary) hypertension: Secondary | ICD-10-CM | POA: Diagnosis not present

## 2022-09-09 DIAGNOSIS — Z23 Encounter for immunization: Secondary | ICD-10-CM | POA: Diagnosis not present

## 2022-09-09 DIAGNOSIS — M459 Ankylosing spondylitis of unspecified sites in spine: Secondary | ICD-10-CM | POA: Diagnosis not present

## 2022-09-09 DIAGNOSIS — Z794 Long term (current) use of insulin: Secondary | ICD-10-CM | POA: Diagnosis not present

## 2022-09-09 DIAGNOSIS — Z0000 Encounter for general adult medical examination without abnormal findings: Secondary | ICD-10-CM | POA: Diagnosis not present

## 2022-09-09 DIAGNOSIS — N1832 Chronic kidney disease, stage 3b: Secondary | ICD-10-CM | POA: Diagnosis not present

## 2022-09-09 DIAGNOSIS — E785 Hyperlipidemia, unspecified: Secondary | ICD-10-CM | POA: Diagnosis not present

## 2022-09-09 DIAGNOSIS — E1142 Type 2 diabetes mellitus with diabetic polyneuropathy: Secondary | ICD-10-CM | POA: Diagnosis not present

## 2022-10-08 ENCOUNTER — Other Ambulatory Visit: Payer: Self-pay | Admitting: Podiatry

## 2022-10-14 DIAGNOSIS — H18413 Arcus senilis, bilateral: Secondary | ICD-10-CM | POA: Diagnosis not present

## 2022-10-14 DIAGNOSIS — H2512 Age-related nuclear cataract, left eye: Secondary | ICD-10-CM | POA: Diagnosis not present

## 2022-10-14 DIAGNOSIS — H2513 Age-related nuclear cataract, bilateral: Secondary | ICD-10-CM | POA: Diagnosis not present

## 2022-10-14 DIAGNOSIS — H25013 Cortical age-related cataract, bilateral: Secondary | ICD-10-CM | POA: Diagnosis not present

## 2022-10-14 DIAGNOSIS — H25043 Posterior subcapsular polar age-related cataract, bilateral: Secondary | ICD-10-CM | POA: Diagnosis not present

## 2022-11-02 ENCOUNTER — Emergency Department (HOSPITAL_COMMUNITY): Payer: PPO

## 2022-11-02 ENCOUNTER — Other Ambulatory Visit: Payer: Self-pay

## 2022-11-02 ENCOUNTER — Inpatient Hospital Stay (HOSPITAL_COMMUNITY)
Admission: EM | Admit: 2022-11-02 | Discharge: 2022-11-05 | DRG: 418 | Disposition: A | Discharge status: Home / Self Care | Payer: PPO | Attending: Internal Medicine | Admitting: Internal Medicine

## 2022-11-02 ENCOUNTER — Encounter (HOSPITAL_COMMUNITY): Payer: Self-pay

## 2022-11-02 DIAGNOSIS — K8000 Calculus of gallbladder with acute cholecystitis without obstruction: Secondary | ICD-10-CM | POA: Diagnosis not present

## 2022-11-02 DIAGNOSIS — Z794 Long term (current) use of insulin: Secondary | ICD-10-CM

## 2022-11-02 DIAGNOSIS — K82A1 Gangrene of gallbladder in cholecystitis: Secondary | ICD-10-CM | POA: Diagnosis present

## 2022-11-02 DIAGNOSIS — Z7982 Long term (current) use of aspirin: Secondary | ICD-10-CM

## 2022-11-02 DIAGNOSIS — R079 Chest pain, unspecified: Principal | ICD-10-CM | POA: Diagnosis present

## 2022-11-02 DIAGNOSIS — E1151 Type 2 diabetes mellitus with diabetic peripheral angiopathy without gangrene: Secondary | ICD-10-CM | POA: Diagnosis present

## 2022-11-02 DIAGNOSIS — E119 Type 2 diabetes mellitus without complications: Secondary | ICD-10-CM | POA: Diagnosis not present

## 2022-11-02 DIAGNOSIS — Z79899 Other long term (current) drug therapy: Secondary | ICD-10-CM

## 2022-11-02 DIAGNOSIS — E669 Obesity, unspecified: Secondary | ICD-10-CM | POA: Diagnosis not present

## 2022-11-02 DIAGNOSIS — E1142 Type 2 diabetes mellitus with diabetic polyneuropathy: Secondary | ICD-10-CM | POA: Diagnosis present

## 2022-11-02 DIAGNOSIS — Z833 Family history of diabetes mellitus: Secondary | ICD-10-CM

## 2022-11-02 DIAGNOSIS — K2900 Acute gastritis without bleeding: Secondary | ICD-10-CM | POA: Diagnosis not present

## 2022-11-02 DIAGNOSIS — K760 Fatty (change of) liver, not elsewhere classified: Secondary | ICD-10-CM | POA: Diagnosis present

## 2022-11-02 DIAGNOSIS — E1165 Type 2 diabetes mellitus with hyperglycemia: Secondary | ICD-10-CM | POA: Diagnosis present

## 2022-11-02 DIAGNOSIS — R062 Wheezing: Secondary | ICD-10-CM | POA: Diagnosis not present

## 2022-11-02 DIAGNOSIS — Z888 Allergy status to other drugs, medicaments and biological substances status: Secondary | ICD-10-CM

## 2022-11-02 DIAGNOSIS — E785 Hyperlipidemia, unspecified: Secondary | ICD-10-CM | POA: Diagnosis not present

## 2022-11-02 DIAGNOSIS — N1832 Chronic kidney disease, stage 3b: Secondary | ICD-10-CM | POA: Diagnosis present

## 2022-11-02 DIAGNOSIS — I10 Essential (primary) hypertension: Secondary | ICD-10-CM | POA: Diagnosis not present

## 2022-11-02 DIAGNOSIS — Z87891 Personal history of nicotine dependence: Secondary | ICD-10-CM

## 2022-11-02 DIAGNOSIS — R1013 Epigastric pain: Secondary | ICD-10-CM

## 2022-11-02 DIAGNOSIS — K219 Gastro-esophageal reflux disease without esophagitis: Secondary | ICD-10-CM | POA: Diagnosis present

## 2022-11-02 DIAGNOSIS — N179 Acute kidney failure, unspecified: Secondary | ICD-10-CM | POA: Diagnosis present

## 2022-11-02 DIAGNOSIS — I700 Atherosclerosis of aorta: Secondary | ICD-10-CM | POA: Diagnosis not present

## 2022-11-02 DIAGNOSIS — R0789 Other chest pain: Secondary | ICD-10-CM | POA: Diagnosis not present

## 2022-11-02 DIAGNOSIS — Z6836 Body mass index (BMI) 36.0-36.9, adult: Secondary | ICD-10-CM

## 2022-11-02 DIAGNOSIS — E7800 Pure hypercholesterolemia, unspecified: Secondary | ICD-10-CM

## 2022-11-02 DIAGNOSIS — Z7985 Long-term (current) use of injectable non-insulin antidiabetic drugs: Secondary | ICD-10-CM

## 2022-11-02 DIAGNOSIS — E1122 Type 2 diabetes mellitus with diabetic chronic kidney disease: Secondary | ICD-10-CM | POA: Diagnosis present

## 2022-11-02 DIAGNOSIS — M109 Gout, unspecified: Secondary | ICD-10-CM | POA: Diagnosis not present

## 2022-11-02 DIAGNOSIS — R000 Tachycardia, unspecified: Secondary | ICD-10-CM | POA: Diagnosis not present

## 2022-11-02 DIAGNOSIS — K8020 Calculus of gallbladder without cholecystitis without obstruction: Secondary | ICD-10-CM | POA: Diagnosis present

## 2022-11-02 DIAGNOSIS — I129 Hypertensive chronic kidney disease with stage 1 through stage 4 chronic kidney disease, or unspecified chronic kidney disease: Secondary | ICD-10-CM | POA: Diagnosis present

## 2022-11-02 DIAGNOSIS — N200 Calculus of kidney: Secondary | ICD-10-CM | POA: Diagnosis present

## 2022-11-02 DIAGNOSIS — I2510 Atherosclerotic heart disease of native coronary artery without angina pectoris: Secondary | ICD-10-CM | POA: Diagnosis not present

## 2022-11-02 DIAGNOSIS — Z885 Allergy status to narcotic agent status: Secondary | ICD-10-CM

## 2022-11-02 DIAGNOSIS — Z7984 Long term (current) use of oral hypoglycemic drugs: Secondary | ICD-10-CM

## 2022-11-02 LAB — CBC WITH DIFFERENTIAL/PLATELET
Abs Immature Granulocytes: 0.05 10*3/uL (ref 0.00–0.07)
Basophils Absolute: 0.1 10*3/uL (ref 0.0–0.1)
Basophils Relative: 1 %
Eosinophils Absolute: 0.2 10*3/uL (ref 0.0–0.5)
Eosinophils Relative: 2 %
HCT: 45.5 % (ref 39.0–52.0)
Hemoglobin: 14.8 g/dL (ref 13.0–17.0)
Immature Granulocytes: 0 %
Lymphocytes Relative: 19 %
Lymphs Abs: 2.4 10*3/uL (ref 0.7–4.0)
MCH: 28.5 pg (ref 26.0–34.0)
MCHC: 32.5 g/dL (ref 30.0–36.0)
MCV: 87.7 fL (ref 80.0–100.0)
Monocytes Absolute: 0.7 10*3/uL (ref 0.1–1.0)
Monocytes Relative: 6 %
Neutro Abs: 9 10*3/uL — ABNORMAL HIGH (ref 1.7–7.7)
Neutrophils Relative %: 72 %
Platelets: 209 10*3/uL (ref 150–400)
RBC: 5.19 MIL/uL (ref 4.22–5.81)
RDW: 13.7 % (ref 11.5–15.5)
WBC: 12.5 10*3/uL — ABNORMAL HIGH (ref 4.0–10.5)
nRBC: 0 % (ref 0.0–0.2)

## 2022-11-02 LAB — GLUCOSE, CAPILLARY: Glucose-Capillary: 174 mg/dL — ABNORMAL HIGH (ref 70–99)

## 2022-11-02 LAB — COMPREHENSIVE METABOLIC PANEL
ALT: 23 U/L (ref 0–44)
AST: 27 U/L (ref 15–41)
Albumin: 3.8 g/dL (ref 3.5–5.0)
Alkaline Phosphatase: 54 U/L (ref 38–126)
Anion gap: 10 (ref 5–15)
BUN: 23 mg/dL (ref 8–23)
CO2: 25 mmol/L (ref 22–32)
Calcium: 9.4 mg/dL (ref 8.9–10.3)
Chloride: 99 mmol/L (ref 98–111)
Creatinine, Ser: 1.31 mg/dL — ABNORMAL HIGH (ref 0.61–1.24)
GFR, Estimated: 57 mL/min — ABNORMAL LOW (ref >60)
Glucose, Bld: 230 mg/dL — ABNORMAL HIGH (ref 70–99)
Potassium: 4.4 mmol/L (ref 3.5–5.1)
Sodium: 134 mmol/L — ABNORMAL LOW (ref 135–145)
Total Bilirubin: 0.7 mg/dL (ref 0.3–1.2)
Total Protein: 7.1 g/dL (ref 6.5–8.1)

## 2022-11-02 LAB — PROTIME-INR
INR: 1 (ref 0.8–1.2)
Prothrombin Time: 13.5 seconds (ref 11.4–15.2)

## 2022-11-02 LAB — MAGNESIUM: Magnesium: 1.8 mg/dL (ref 1.7–2.4)

## 2022-11-02 LAB — LIPASE, BLOOD: Lipase: 34 U/L (ref 11–51)

## 2022-11-02 LAB — TROPONIN I (HIGH SENSITIVITY)
Troponin I (High Sensitivity): 6 ng/L (ref <18)
Troponin I (High Sensitivity): 5 ng/L (ref <18)
Troponin I (High Sensitivity): 12 ng/L (ref <18)
Troponin I (High Sensitivity): 12 ng/L (ref <18)

## 2022-11-02 LAB — CBG MONITORING, ED: Glucose-Capillary: 224 mg/dL — ABNORMAL HIGH (ref 70–99)

## 2022-11-02 MED ORDER — FENTANYL CITRATE PF 50 MCG/ML IJ SOSY
100.0000 ug | PREFILLED_SYRINGE | Freq: Once | INTRAMUSCULAR | Status: AC
  Administered 2022-11-02: 100 ug via INTRAVENOUS
  Filled 2022-11-02: qty 2

## 2022-11-02 MED ORDER — ALBUTEROL SULFATE (2.5 MG/3ML) 0.083% IN NEBU
3.0000 mL | INHALATION_SOLUTION | Freq: Four times a day (QID) | RESPIRATORY_TRACT | Status: DC | PRN
  Administered 2022-11-05: 3 mL via RESPIRATORY_TRACT
  Filled 2022-11-02: qty 3

## 2022-11-02 MED ORDER — ALUM & MAG HYDROXIDE-SIMETH 200-200-20 MG/5ML PO SUSP
30.0000 mL | Freq: Once | ORAL | Status: AC
  Administered 2022-11-02: 30 mL via ORAL
  Filled 2022-11-02: qty 30

## 2022-11-02 MED ORDER — IOHEXOL 350 MG/ML SOLN
100.0000 mL | Freq: Once | INTRAVENOUS | Status: AC | PRN
  Administered 2022-11-02: 100 mL via INTRAVENOUS

## 2022-11-02 MED ORDER — ENOXAPARIN SODIUM 40 MG/0.4ML IJ SOSY
40.0000 mg | PREFILLED_SYRINGE | INTRAMUSCULAR | Status: DC
  Administered 2022-11-02 – 2022-11-05 (×2): 40 mg via SUBCUTANEOUS
  Filled 2022-11-02 (×3): qty 0.4

## 2022-11-02 MED ORDER — LACTATED RINGERS IV BOLUS
500.0000 mL | Freq: Once | INTRAVENOUS | Status: AC
  Administered 2022-11-02: 500 mL via INTRAVENOUS

## 2022-11-02 MED ORDER — NITROGLYCERIN 2 % TD OINT
1.0000 [in_us] | TOPICAL_OINTMENT | Freq: Once | TRANSDERMAL | Status: AC
  Administered 2022-11-02: 1 [in_us] via TOPICAL
  Filled 2022-11-02: qty 1

## 2022-11-02 MED ORDER — INSULIN ASPART 100 UNIT/ML IJ SOLN
0.0000 [IU] | Freq: Three times a day (TID) | INTRAMUSCULAR | Status: DC
  Administered 2022-11-03 – 2022-11-04 (×5): 5 [IU] via SUBCUTANEOUS
  Administered 2022-11-04: 15 [IU] via SUBCUTANEOUS

## 2022-11-02 MED ORDER — ONDANSETRON HCL 4 MG/2ML IJ SOLN
4.0000 mg | Freq: Four times a day (QID) | INTRAMUSCULAR | Status: DC | PRN
  Administered 2022-11-02: 4 mg via INTRAVENOUS
  Filled 2022-11-02: qty 2

## 2022-11-02 MED ORDER — ROSUVASTATIN CALCIUM 20 MG PO TABS
20.0000 mg | ORAL_TABLET | Freq: Every day | ORAL | Status: DC
  Administered 2022-11-03 – 2022-11-05 (×3): 20 mg via ORAL
  Filled 2022-11-02 (×3): qty 1

## 2022-11-02 MED ORDER — SUCRALFATE 1 GM/10ML PO SUSP: 1.0000 g | Freq: Three times a day (TID) | ORAL | 0 refills | Status: DC

## 2022-11-02 MED ORDER — ACETAMINOPHEN 325 MG PO TABS
650.0000 mg | ORAL_TABLET | ORAL | Status: DC | PRN
  Administered 2022-11-03: 650 mg via ORAL
  Filled 2022-11-02 (×2): qty 2

## 2022-11-02 MED ORDER — INSULIN ASPART 100 UNIT/ML IJ SOLN
0.0000 [IU] | Freq: Every day | INTRAMUSCULAR | Status: DC
  Administered 2022-11-03: 4 [IU] via SUBCUTANEOUS
  Administered 2022-11-03: 3 [IU] via SUBCUTANEOUS

## 2022-11-02 MED ORDER — SUCRALFATE 1 GM/10ML PO SUSP
1.0000 g | Freq: Three times a day (TID) | ORAL | Status: DC
  Administered 2022-11-02 – 2022-11-05 (×10): 1 g via ORAL
  Filled 2022-11-02 (×11): qty 10

## 2022-11-02 MED ORDER — PANTOPRAZOLE SODIUM 40 MG PO TBEC
40.0000 mg | DELAYED_RELEASE_TABLET | Freq: Two times a day (BID) | ORAL | Status: DC
  Administered 2022-11-02 – 2022-11-05 (×6): 40 mg via ORAL
  Filled 2022-11-02 (×6): qty 1

## 2022-11-02 MED ORDER — PANTOPRAZOLE SODIUM 40 MG PO TBEC: 40.0000 mg | DELAYED_RELEASE_TABLET | Freq: Every day | ORAL | 2 refills | Status: DC

## 2022-11-02 MED ORDER — INSULIN GLARGINE-YFGN 100 UNIT/ML ~~LOC~~ SOLN
60.0000 [IU] | Freq: Every day | SUBCUTANEOUS | Status: DC
  Administered 2022-11-03 – 2022-11-04 (×2): 60 [IU] via SUBCUTANEOUS
  Filled 2022-11-02 (×3): qty 0.6

## 2022-11-02 MED ORDER — LIDOCAINE VISCOUS HCL 2 % MT SOLN
15.0000 mL | Freq: Once | OROMUCOSAL | Status: AC
  Administered 2022-11-02: 15 mL via ORAL
  Filled 2022-11-02: qty 15

## 2022-11-02 MED ORDER — ASPIRIN 81 MG PO TBEC
81.0000 mg | DELAYED_RELEASE_TABLET | Freq: Every day | ORAL | Status: DC
  Administered 2022-11-03 – 2022-11-04 (×2): 81 mg via ORAL
  Filled 2022-11-02 (×2): qty 1

## 2022-11-02 MED ORDER — ASPIRIN 81 MG PO CHEW
324.0000 mg | CHEWABLE_TABLET | Freq: Once | ORAL | Status: AC
Start: 2022-11-02 — End: 2022-11-02
  Administered 2022-11-02: 324 mg via ORAL
  Filled 2022-11-02: qty 4

## 2022-11-02 MED ORDER — LISINOPRIL 20 MG PO TABS
20.0000 mg | ORAL_TABLET | Freq: Every day | ORAL | Status: DC
  Administered 2022-11-03: 20 mg via ORAL
  Filled 2022-11-02: qty 1

## 2022-11-02 MED ORDER — ALLOPURINOL 100 MG PO TABS
100.0000 mg | ORAL_TABLET | Freq: Two times a day (BID) | ORAL | Status: DC
  Administered 2022-11-02 – 2022-11-05 (×6): 100 mg via ORAL
  Filled 2022-11-02 (×6): qty 1

## 2022-11-02 NOTE — ED Triage Notes (Signed)
Pt arrived POV from home c/o centralized CP that started around 430am this morning. Pt denies any radiation to his arms but states some in his back. Pt denies any N/V or SHOB. Pt has been diaphoretic.

## 2022-11-02 NOTE — ED Notes (Signed)
Steven Mcclure requesting status update [phone]

## 2022-11-02 NOTE — ED Provider Notes (Addendum)
[place] [facility]GENC[facility] DEPARTMENT AT [facility] Provider Note   CSN: [id number] Arrival date & time: 11/02/22  4098     History  Chief Complaint  Patient presents with   Chest Pain    Steven Mcclure is a 72 y.o. male.  HPI Patient presents for chest pain.  Medical history includes DM, CAD, CKD, gout, HLD, HTN.  He is followed by Dr. Anne Fu.  Last heart cath was 2 years ago.  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  Patient reports that he was in his normal state of health last night.  This morning, at approximately 4:30 AM, he awoke with diffuse lower chest pain that radiates into epigastrium.  He had associated diaphoresis.  He denies any nausea or shortness of breath.  He took some Tylenol and Tums with no relief of his symptoms.  Current pain is 8/10 in severity.  He takes a daily aspirin but has not taken any today.    Home Medications Prior to Admission medications   Medication Sig Start Date End Date Taking? Authorizing Provider  pantoprazole (PROTONIX) 40 MG tablet Take 1 tablet (40 mg total) by mouth daily. 11/02/22 01/31/23 Yes Gloris Manchester, MD  sucralfate (CARAFATE) 1 GM/10ML suspension Take 10 mLs (1 g total) by mouth with breakfast, with lunch, and with evening meal. 11/02/22  Yes Gloris Manchester, MD  acetaminophen (TYLENOL) 500 MG tablet Take 1,000 mg by mouth every 6 (six) hours as needed for moderate pain or headache.    [provider]  albuterol (VENTOLIN HFA) 108 (90 Base) MCG/ACT inhaler Inhale 1-2 puffs into the lungs every 6 (six) hours as needed for shortness of breath or wheezing. 06/25/18   [provider]  allopurinol (ZYLOPRIM) 100 MG tablet TAKE 1 TABLET BY MOUTH TWICE A DAY 10/08/22   Hyatt, Max T, DPM  Allopurinol 200 MG TABS Take 200 mg by mouth daily.    [provider]  aspirin EC 81 MG tablet Take 81 mg by mouth daily.    [provider]  BD INSULIN SYRINGE U/F 31G X 5/16" 1 ML MISC  05/05/18   [provider]  docusate sodium (COLACE) 50 MG capsule Take 50 mg by mouth daily.    [provider]  doxycycline (VIBRA-TABS) 100 MG tablet Take 1 tablet (100 mg total) by mouth 2 (two) times daily. 05/16/21   Vivi Barrack, DPM  guaiFENesin-codeine 100-10 MG/5ML syrup Take 5 mLs by mouth every 6 (six) hours as needed for cough. 06/25/18   [provider]  LANTUS 100 UNIT/ML injection Inject 90 Units into the skin daily. 08/28/16   [provider]  lisinopril (ZESTRIL) 20 MG tablet Take 1 tablet (20 mg total) by mouth daily. 04/03/22   Jake Bathe, MD  loratadine (CLARITIN) 10 MG tablet Take 20 mg by mouth daily.    [provider]  metFORMIN (GLUCOPHAGE) 1000 MG tablet Take 1 tablet (1,000 mg total) by mouth daily. 12/26/20   Corky Crafts, MD  Multiple Vitamin (MULTIVITAMIN WITH MINERALS) TABS tablet Take 1 tablet by mouth daily.    [provider]  Omega-3 Fatty Acids (FISH OIL) 1200 MG CAPS Take 1,200 mg by mouth daily.    [provider]  OneTouch Delica Lancets 33G MISC See admin instructions. 12/04/14   [provider]  ONETOUCH VERIO test strip  09/02/16   [provider]  rosuvastatin (CRESTOR) 20 MG tablet Take 1 tablet (20 mg total) by mouth  daily. 04/03/22   Jake Bathe, MD  TRULICITY 3 MG/0.5ML SOPN Inject 3 mg as directed every Friday. 07/28/20   [provider]  vitamin B-12 (CYANOCOBALAMIN) 1000 MCG tablet Take 1,000 mcg by mouth daily. 06/19/16   [provider]      Allergies    Atorvastatin, Rosuvastatin, Hydrocod poli-chlorphe poli er, and Tussin cough [dextromethorphan hbr]    Review of Systems   Review of Systems  Constitutional:  Positive for diaphoresis.  Cardiovascular:  Positive for chest pain.  Gastrointestinal:  Positive for abdominal pain.  All other systems reviewed and are negative.   Physical Exam Updated Vital Signs BP (!) 165/67   Pulse (!) 108   Temp  99.5 F (37.5 C) (Oral)   Resp 20   Ht 5\' 11"  (1.803 m)   Wt 117.5 kg   SpO2 93%   BMI 36.12 kg/m  Physical Exam Vitals and nursing note reviewed.  Constitutional:      General: He is not in acute distress.    Appearance: He is well-developed. He is not ill-appearing, toxic-appearing or diaphoretic.  HENT:     Head: Normocephalic and atraumatic.  Eyes:     Conjunctiva/sclera: Conjunctivae normal.  Cardiovascular:     Rate and Rhythm: Normal rate and regular rhythm.     Heart sounds: No murmur heard. Pulmonary:     Effort: Pulmonary effort is normal. No respiratory distress.     Breath sounds: Normal breath sounds.  Chest:     Chest wall: No tenderness.  Abdominal:     Palpations: Abdomen is soft.     Tenderness: There is abdominal tenderness in the epigastric area.  Musculoskeletal:        General: No swelling.     Cervical back: Neck supple.     Right lower leg: No edema.     Left lower leg: No edema.  Skin:    General: Skin is warm and dry.     Capillary Refill: Capillary refill takes less than 2 seconds.     Coloration: Skin is not cyanotic or pale.  Neurological:     General: No focal deficit present.     Mental Status: He is alert and oriented to person, place, and time.  Psychiatric:        Mood and Affect: Mood normal.        Behavior: Behavior normal.     ED Results / Procedures / Treatments   Labs (all labs ordered are listed, but only abnormal results are displayed) Labs Reviewed  COMPREHENSIVE METABOLIC PANEL - Abnormal; Notable for the following components:      Result Value   Sodium 134 (*)    Glucose, Bld 230 (*)    Creatinine, Ser 1.31 (*)    GFR, Estimated 57 (*)    All other components within normal limits  CBC WITH DIFFERENTIAL/PLATELET - Abnormal; Notable for the following components:   WBC 12.5 (*)    Neutro Abs 9.0 (*)    All other components within normal limits  CBG MONITORING, ED - Abnormal; Notable for the following components:    Glucose-Capillary 224 (*)    All other components within normal limits  MAGNESIUM  LIPASE, BLOOD  PROTIME-INR  I-STAT CHEM 8, ED  TROPONIN I (HIGH SENSITIVITY)  TROPONIN I (HIGH SENSITIVITY)  TROPONIN I (HIGH SENSITIVITY)  TROPONIN I (HIGH SENSITIVITY)    EKG EKG Interpretation  Date/Time:  Sunday Nov 02 2022 07:45:24 EDT Ventricular Rate:  67 PR Interval:  233 QRS Duration: 106 QT Interval:  389 QTC Calculation: 411 R Axis:   86 Text Interpretation: Sinus rhythm Prolonged PR interval Low voltage, precordial leads Consider right ventricular hypertrophy Confirmed by Gloris Manchester ([id number]) on 11/02/2022 8:05:32 AM  Radiology CT Angio Chest/Abd/Pel for Dissection W and/or Wo Contrast  Result Date: 11/02/2022 CLINICAL DATA:  Central chest pain and diaphoresis. EXAM: CT ANGIOGRAPHY CHEST, ABDOMEN AND PELVIS TECHNIQUE: Non-contrast CT of the chest was initially obtained. Multidetector CT imaging through the chest, abdomen and pelvis was performed using the standard protocol during bolus administration of intravenous contrast. Multiplanar reconstructed images and MIPs were obtained and reviewed to evaluate the vascular anatomy. RADIATION DOSE REDUCTION: This exam was performed according to the departmental dose-optimization program which includes automated exposure control, adjustment of the mA and/or kV according to patient size and/or use of iterative reconstruction technique. CONTRAST:  OMNIPAQUE IOHEXOL 350 MG/ML SOLN COMPARISON:  Coronary calcium score study on 10/12/2020, CT of the abdomen and pelvis without contrast on 07/10/2010, CTA of the chest on 12/21/2009 FINDINGS: CTA CHEST FINDINGS Cardiovascular: Atherosclerosis of the thoracic aorta without evidence of aneurysm or dissection. Visualized proximal great vessels demonstrate normal patency and branching anatomy. The heart size is within normal limits. Extensive calcified coronary artery plaque again noted in a 3 vessel distribution.  No pericardial fluid identified. Central pulmonary arteries are normal in caliber. Mediastinum/Nodes: No enlarged mediastinal, hilar, or axillary lymph nodes. Thyroid gland, trachea, and esophagus demonstrate no significant findings. Lungs/Pleura: There is no evidence of pulmonary edema, consolidation, pneumothorax, nodule or pleural fluid. Musculoskeletal: No chest wall abnormality. No acute or significant osseous findings. Review of the MIP images confirms the above findings. CTA ABDOMEN AND PELVIS FINDINGS VASCULAR Aorta: Atherosclerosis of the abdominal aorta without evidence of aneurysm or dissection. Celiac: Normally patent. Normally patent branch vessels and branch vessel anatomy. SMA: Normally patent. Renals: Single right and paired left renal arteries demonstrate normal patency. IMA: Normally patent. Inflow: Atherosclerosis of bilateral proximal common iliac arteries without stenosis. Bilateral iliac arteries demonstrate no aneurysmal disease or significant obstructive disease. Bilateral common femoral arteries and femoral bifurcations demonstrate normal patency. Review of the MIP images confirms the above findings. NON-VASCULAR Hepatobiliary: Liver demonstrates evidence of diffuse steatosis. No overt cirrhotic morphology. Multiple gallstones within the gallbladder. No overt gallbladder inflammation or evidence of biliary ductal dilatation. Pancreas: Unremarkable. No pancreatic ductal dilatation or surrounding inflammatory changes. Spleen: Normal in size without focal abnormality. Adrenals/Urinary Tract: Normal adrenal glands. Cluster of nonobstructing calculi in the lower pole of the right kidney measuring up to approximately 10 mm. No ureteral or bladder abnormalities. Stomach/Bowel: No oral contrast administered. Bowel shows no evidence of obstruction, ileus, inflammation or lesion. The appendix is normal. No free intraperitoneal air. Diffuse diverticulosis of the lower descending and entire sigmoid  colon without evidence of diverticulitis. The entire colon is very redundant. Lymphatic: No lymphadenopathy identified in the abdomen or pelvis. Reproductive: Prostate is unremarkable. Other: No abdominal wall hernia or abnormality. No abdominopelvic ascites. Musculoskeletal: No acute or significant osseous findings. Review of the MIP images confirms the above findings. IMPRESSION: 1. No acute findings in the chest, abdomen or pelvis. 2. Atherosclerosis of the thoracic and abdominal aorta without evidence of aneurysm or dissection. 3. Significant coronary atherosclerosis with calcified coronary artery plaque in a 3 vessel distribution. This has been documented previously by coronary calcium score study in 2022. 4. Hepatic steatosis. 5. Cholelithiasis without evidence of cholecystitis or biliary obstruction. 6. Cluster of nonobstructing calculi in the  lower pole of the right kidney measuring up to 10 mm. 7. Diverticulosis of the lower descending and entire sigmoid colon without evidence of diverticulitis. Aortic Atherosclerosis (ICD10-I70.0). Electronically Signed   By: Irish Lack M.D.   On: 11/02/2022 10:30   DG Chest Portable 1 View  Result Date: 11/02/2022 CLINICAL DATA:  Chest pain EXAM: PORTABLE CHEST 1 VIEW COMPARISON:  03/11/2022 FINDINGS: Artifact from EKG leads. Normal heart size and mediastinal contours. No acute infiltrate or edema. No effusion or pneumothorax. No acute osseous findings. IMPRESSION: No evidence of active disease. Electronically Signed   By: Tiburcio Pea M.D.   On: 11/02/2022 08:40    Procedures Procedures    Medications Ordered in ED Medications  aspirin chewable tablet 324 mg (324 mg Oral Given 11/02/22 0830)  nitroGLYCERIN (NITROGLYN) 2 % ointment 1 inch (1 inch Topical Given 11/02/22 0830)  fentaNYL (SUBLIMAZE) injection 100 mcg (100 mcg Intravenous Given 11/02/22 0830)  fentaNYL (SUBLIMAZE) injection 100 mcg (100 mcg Intravenous Given 11/02/22 0936)  alum & mag  hydroxide-simeth (MAALOX/MYLANTA) 200-200-20 MG/5ML suspension 30 mL (30 mLs Oral Given 11/02/22 0936)    And  lidocaine (XYLOCAINE) 2 % viscous mouth solution 15 mL (15 mLs Oral Given 11/02/22 0936)  iohexol (OMNIPAQUE) 350 MG/ML injection 100 mL (100 mLs Intravenous Contrast Given 11/02/22 1014)  lactated ringers bolus 500 mL (0 mLs Intravenous Stopped 11/02/22 1621)    ED Course/ Medical Decision Making/ A&P                             Medical Decision Making Amount and/or Complexity of Data Reviewed Labs: ordered. Radiology: ordered.  Risk OTC drugs. Prescription drug management.   This patient presents to the ED for concern of chest/epigastric pain, this involves an extensive number of treatment options, and is a complaint that carries with it a high risk of complications and morbidity.  The differential diagnosis includes ACS, gastritis, aortic dissection, AAA, cholecystitis, pancreatitis, GERD   Co morbidities that complicate the patient evaluation  DM, CAD, CKD, gout, HLD, HTN   Additional history obtained:  Additional history obtained from N/A External records from outside source obtained and reviewed including EMR   Lab Tests:  I Ordered, and personally interpreted labs.  The pertinent results include: Baseline creatinine, normal electrolytes, mild leukocytosis, normal lipase, normal hepatobiliary enzymes, normal troponins x 2   Imaging Studies ordered:  I ordered imaging studies including chest x-ray, CTA dissection study I independently visualized and interpreted imaging which showed no acute findings I agree with the radiologist interpretation   Cardiac Monitoring: / EKG:  The patient was maintained on a cardiac monitor.  I personally viewed and interpreted the cardiac monitored which showed an underlying rhythm of: Sinus rhythm   Problem List / ED Course / Critical interventions / Medication management  Patient presents for central chest pain, radiating  the epigastrium starting at 4:30 AM.  He had associated diaphoresis but denies any other associated symptoms.  On arrival in the ED, vital signs are notable for hypertension.  Currently, patient takes lisinopril, typically in the mornings, but has not taken any today.  ASA was given for concern of ACS.  Fentanyl was ordered for analgesia.  NTG ointment was ordered for chest pain and hypertension.  EKG does not show ST segment elevations.  Lab work was initiated.  On reassessment, patient reports a very slight improvement in his chest pain.  Additional fentanyl was ordered.  Following  initial troponin, which resulted as normal, patient was given GI cocktail.  He had significant relief of symptoms after this.  I suspect gastritis/PUD.  He underwent a dissection study which was negative for acute findings.  Repeat troponin is reassuring.  Currently, patient is not on a PPI.  He was given prescription for PPI and Carafate.  He was advised to take Maalox as needed.  He currently does not smoke or use frequent NSAIDs.  He drinks only occasionally.  He is established with [facility] gastroenterology.  He was advised to follow-up with them as soon as possible.  Initial plan was for discharge with GI and cardiology follow-up.  However, prior to leaving the ED, patient had an episode of feeling flushed, vomiting, and then had tachycardia, despite feeling better after vomiting.  Patient was kept on bedside cardiac monitor.  Third troponin was ordered.  Third troponin was unremarkable.  Patient, however, remained tachycardic.  Rhythm appears to be sinus rhythm.  IV fluids were ordered.  I spoke with cardiologist, Dr. Royann Shivers, who will see the patient in consult.  Patient to be admitted to medicine for observation. I ordered medication including ASA, NTG, fentanyl for chest pain; GI cocktail for empiric treatment of gastritis/PUD Reevaluation of the patient after these medicines showed that the patient improved I have reviewed  the patients home medicines and have made adjustments as needed   Social Determinants of Health:  Has access to outpatient care         Final Clinical Impression(s) / ED Diagnoses Final diagnoses:  Chest pain, unspecified type  Epigastric pain    Rx / DC Orders ED Discharge Orders          Ordered    pantoprazole (PROTONIX) 40 MG tablet  Daily        11/02/22 1204    sucralfate (CARAFATE) 1 GM/10ML suspension  3 times daily with meals        11/02/22 1204    Ambulatory referral to Cardiology       Comments: If you have not heard from the Cardiology office within the next 72 hours please call [phone].   11/02/22 1205              Gloris Manchester, MD 11/02/22 2130    Gloris Manchester, MD 11/02/22 [phone]

## 2022-11-02 NOTE — Discharge Instructions (Addendum)

## 2022-11-02 NOTE — H&P (Addendum)
History and Physical   Steven Mcclure UJW:[id number] DOB: Oct 22, 1950 DOA: 11/02/2022  PCP: [name], MD   Patient coming from: Home  Chief Complaint: Chest Pain  HPI: Steven Mcclure is a 72 y.o. male with medical history significant of hypertension, hyperlipidemia, diabetes, CKD 3B, gout, obesity, CAD presenting with chest pain.  Patient reports that chest pain started around 430 this morning and woke him from sleep.  Pain located primarily in his lower chest and radiating to his epigastrium.  Had associated diaphoresis but no nausea.  He tried some Tylenol and Tums at home without relief.  He did get relief with GI cocktail in the ED.  But had additional episode as below.  Denies fevers, chills, shortness of breath, abdominal pain, constipation, diarrhea, nausea, vomiting.  ED Course: Vital signs in the ED notable for blood pressure in the 140s to 190s, heart rate in the 60s to 110s, respiratory rate in the teens to 20s.  Lab workup included CMP with sodium 134, creatinine stable 1.31, glucose 230.  CBC with leukocytosis stable at 12.5.  PT and INR normal.  Troponin negative x 3 with fourth pending.  Lipase negative.  Chest x-ray showed no acute normality.  CT of the chest abdomen pelvis showed no acute abnormality but showed aortic atherosclerosis and coronary atherosclerosis.  Also noted was hepatic steatosis, right renal calculi that were nonobstructing and diverticulosis without diverticulitis.  Magnesium also normal.  Patient received GI cocktail, liter of fluids, Nitropaste in the ED.  Cardiology consulted recommending observation overnight and echocardiogram.  Review of Systems: As per HPI otherwise all other systems reviewed and are negative.  Past Medical History:  Diagnosis Date   Body mass index 40.0-44.9, adult (HCC)    CKD (chronic kidney disease), stage III (HCC)    Coronary atherosclerosis due to severely calcified coronary lesion    Diabetes mellitus without  complication (HCC)    ED (erectile dysfunction)    Gout    History of kidney stones    Hx of colonic polyps    Hyperlipidemia    Hypertension    Long-term insulin use in type 2 diabetes (HCC)    Low testosterone in male    Obesity    Vertigo    Vitamin B12 deficiency     Past Surgical History:  Procedure Laterality Date   KIDNEY STONE SURGERY     LEFT HEART CATH AND CORONARY ANGIOGRAPHY N/A 12/24/2020   Procedure: LEFT HEART CATH AND CORONARY ANGIOGRAPHY;  Surgeon: Corky Crafts, MD;  Location: [facility];  Service: Cardiovascular;  Laterality: N/A;    Social History  reports that he quit smoking about 45 years ago. His smoking use included cigarettes. He has never used smokeless tobacco. He reports current alcohol use. He reports that he does not use drugs.  Allergies  Allergen Reactions   Atorvastatin     musculoskeletal pain   Rosuvastatin     Other reaction(s): musculoskeletal pain   Hydrocod Poli-Chlorphe Poli Er Rash   Tussin Cough [Dextromethorphan Hbr] Rash    Family History  Problem Relation Age of Onset   Diabetes Mother    Diabetes Father   Reviewed on admission  Prior to Admission medications   Medication Sig Start Date End Date Taking? Authorizing Provider  pantoprazole (PROTONIX) 40 MG tablet Take 1 tablet (40 mg total) by mouth daily. 11/02/22 01/31/23 Yes Gloris Manchester, MD  sucralfate (CARAFATE) 1 GM/10ML suspension Take 10 mLs (1 g total) by mouth with breakfast,  with lunch, and with evening meal. 11/02/22  Yes Gloris Manchester, MD  acetaminophen (TYLENOL) 500 MG tablet Take 1,000 mg by mouth every 6 (six) hours as needed for moderate pain or headache.    [provider]  albuterol (VENTOLIN HFA) 108 (90 Base) MCG/ACT inhaler Inhale 1-2 puffs into the lungs every 6 (six) hours as needed for shortness of breath or wheezing. 06/25/18   [provider]  allopurinol (ZYLOPRIM) 100 MG tablet TAKE 1 TABLET BY MOUTH TWICE A DAY 10/08/22    Hyatt, Max T, DPM  Allopurinol 200 MG TABS Take 200 mg by mouth daily.    [provider]  aspirin EC 81 MG tablet Take 81 mg by mouth daily.    [provider]  BD INSULIN SYRINGE U/F 31G X 5/16" 1 ML MISC  05/05/18   [provider]  docusate sodium (COLACE) 50 MG capsule Take 50 mg by mouth daily.    [provider]  doxycycline (VIBRA-TABS) 100 MG tablet Take 1 tablet (100 mg total) by mouth 2 (two) times daily. 05/16/21   Vivi Barrack, DPM  guaiFENesin-codeine 100-10 MG/5ML syrup Take 5 mLs by mouth every 6 (six) hours as needed for cough. 06/25/18   [provider]  LANTUS 100 UNIT/ML injection Inject 90 Units into the skin daily. 08/28/16   [provider]  lisinopril (ZESTRIL) 20 MG tablet Take 1 tablet (20 mg total) by mouth daily. 04/03/22   Jake Bathe, MD  loratadine (CLARITIN) 10 MG tablet Take 20 mg by mouth daily.    [provider]  metFORMIN (GLUCOPHAGE) 1000 MG tablet Take 1 tablet (1,000 mg total) by mouth daily. 12/26/20   Corky Crafts, MD  Multiple Vitamin (MULTIVITAMIN WITH MINERALS) TABS tablet Take 1 tablet by mouth daily.    [provider]  Omega-3 Fatty Acids (FISH OIL) 1200 MG CAPS Take 1,200 mg by mouth daily.    [provider]  OneTouch Delica Lancets 33G MISC See admin instructions. 12/04/14   [provider]  ONETOUCH VERIO test strip  09/02/16   [provider]  rosuvastatin (CRESTOR) 20 MG tablet Take 1 tablet (20 mg total) by mouth daily. 04/03/22   Jake Bathe, MD  TRULICITY 3 MG/0.5ML SOPN Inject 3 mg as directed every Friday. 07/28/20   [provider]  vitamin B-12 (CYANOCOBALAMIN) 1000 MCG tablet Take 1,000 mcg by mouth daily. 06/19/16   [provider]    Physical Exam: Vitals:   11/02/22 1500 11/02/22 1515 11/02/22 1545 11/02/22 1615  BP: (!) 142/74 (!) 151/64 (!) 151/63 (!) 165/67  Pulse: (!) 111 (!) 109 (!) 107 (!) 108   Resp: (!) 21 (!) 22 19 20   Temp:    99.5 F (37.5 C)  TempSrc:    Oral  SpO2: 95% 96% 94% 93%  Weight:      Height:        Physical Exam Constitutional:      General: He is not in acute distress.    Appearance: Normal appearance.  HENT:     Head: Normocephalic and atraumatic.     Mouth/Throat:     Mouth: Mucous membranes are moist.     Pharynx: Oropharynx is clear.  Eyes:     Extraocular Movements: Extraocular movements intact.     Pupils: Pupils are equal, round, and reactive to light.  Cardiovascular:     Rate and Rhythm: Regular rhythm. Tachycardia present.     Pulses: Normal  pulses.     Heart sounds: Normal heart sounds.  Pulmonary:     Effort: Pulmonary effort is normal. No respiratory distress.     Breath sounds: Normal breath sounds.  Abdominal:     General: Bowel sounds are normal. There is no distension.     Palpations: Abdomen is soft.     Tenderness: There is no abdominal tenderness.  Musculoskeletal:        General: No swelling or deformity.  Skin:    General: Skin is warm and dry.  Neurological:     General: No focal deficit present.     Mental Status: Mental status is at baseline.    Labs on Admission: I have personally reviewed following labs and imaging studies  CBC: Recent Labs  Lab 11/02/22 0816  WBC 12.5*  NEUTROABS 9.0*  HGB 14.8  HCT 45.5  MCV 87.7  PLT 209    Basic Metabolic Panel: Recent Labs  Lab 11/02/22 0816  NA 134*  K 4.4  CL 99  CO2 25  GLUCOSE 230*  BUN 23  CREATININE 1.31*  CALCIUM 9.4  MG 1.8    GFR: Estimated Creatinine Clearance: 65.5 mL/min (A) (by C-G formula based on SCr of 1.31 mg/dL (H)).  Liver Function Tests: Recent Labs  Lab 11/02/22 0816  AST 27  ALT 23  ALKPHOS 54  BILITOT 0.7  PROT 7.1  ALBUMIN 3.8    Urine analysis: No results found for: "COLORURINE", "APPEARANCEUR", "LABSPEC", "PHURINE", "GLUCOSEU", "HGBUR", "BILIRUBINUR", "KETONESUR", "PROTEINUR", "UROBILINOGEN", "NITRITE",  "LEUKOCYTESUR"  Radiological Exams on Admission: CT Angio Chest/Abd/Pel for Dissection W and/or Wo Contrast  Result Date: 11/02/2022 CLINICAL DATA:  Central chest pain and diaphoresis. EXAM: CT ANGIOGRAPHY CHEST, ABDOMEN AND PELVIS TECHNIQUE: Non-contrast CT of the chest was initially obtained. Multidetector CT imaging through the chest, abdomen and pelvis was performed using the standard protocol during bolus administration of intravenous contrast. Multiplanar reconstructed images and MIPs were obtained and reviewed to evaluate the vascular anatomy. RADIATION DOSE REDUCTION: This exam was performed according to the departmental dose-optimization program which includes automated exposure control, adjustment of the mA and/or kV according to patient size and/or use of iterative reconstruction technique. CONTRAST:  OMNIPAQUE IOHEXOL 350 MG/ML SOLN COMPARISON:  Coronary calcium score study on 10/12/2020, CT of the abdomen and pelvis without contrast on 07/10/2010, CTA of the chest on 12/21/2009 FINDINGS: CTA CHEST FINDINGS Cardiovascular: Atherosclerosis of the thoracic aorta without evidence of aneurysm or dissection. Visualized proximal great vessels demonstrate normal patency and branching anatomy. The heart size is within normal limits. Extensive calcified coronary artery plaque again noted in a 3 vessel distribution. No pericardial fluid identified. Central pulmonary arteries are normal in caliber. Mediastinum/Nodes: No enlarged mediastinal, hilar, or axillary lymph nodes. Thyroid gland, trachea, and esophagus demonstrate no significant findings. Lungs/Pleura: There is no evidence of pulmonary edema, consolidation, pneumothorax, nodule or pleural fluid. Musculoskeletal: No chest wall abnormality. No acute or significant osseous findings. Review of the MIP images confirms the above findings. CTA ABDOMEN AND PELVIS FINDINGS VASCULAR Aorta: Atherosclerosis of the abdominal aorta without evidence of aneurysm  or dissection. Celiac: Normally patent. Normally patent branch vessels and branch vessel anatomy. SMA: Normally patent. Renals: Single right and paired left renal arteries demonstrate normal patency. IMA: Normally patent. Inflow: Atherosclerosis of bilateral proximal common iliac arteries without stenosis. Bilateral iliac arteries demonstrate no aneurysmal disease or significant obstructive disease. Bilateral common femoral arteries and femoral bifurcations demonstrate normal patency. Review of the MIP images confirms the above  findings. NON-VASCULAR Hepatobiliary: Liver demonstrates evidence of diffuse steatosis. No overt cirrhotic morphology. Multiple gallstones within the gallbladder. No overt gallbladder inflammation or evidence of biliary ductal dilatation. Pancreas: Unremarkable. No pancreatic ductal dilatation or surrounding inflammatory changes. Spleen: Normal in size without focal abnormality. Adrenals/Urinary Tract: Normal adrenal glands. Cluster of nonobstructing calculi in the lower pole of the right kidney measuring up to approximately 10 mm. No ureteral or bladder abnormalities. Stomach/Bowel: No oral contrast administered. Bowel shows no evidence of obstruction, ileus, inflammation or lesion. The appendix is normal. No free intraperitoneal air. Diffuse diverticulosis of the lower descending and entire sigmoid colon without evidence of diverticulitis. The entire colon is very redundant. Lymphatic: No lymphadenopathy identified in the abdomen or pelvis. Reproductive: Prostate is unremarkable. Other: No abdominal wall hernia or abnormality. No abdominopelvic ascites. Musculoskeletal: No acute or significant osseous findings. Review of the MIP images confirms the above findings. IMPRESSION: 1. No acute findings in the chest, abdomen or pelvis. 2. Atherosclerosis of the thoracic and abdominal aorta without evidence of aneurysm or dissection. 3. Significant coronary atherosclerosis with calcified coronary  artery plaque in a 3 vessel distribution. This has been documented previously by coronary calcium score study in 2022. 4. Hepatic steatosis. 5. Cholelithiasis without evidence of cholecystitis or biliary obstruction. 6. Cluster of nonobstructing calculi in the lower pole of the right kidney measuring up to 10 mm. 7. Diverticulosis of the lower descending and entire sigmoid colon without evidence of diverticulitis. Aortic Atherosclerosis (ICD10-I70.0). Electronically Signed   By: Irish Lack M.D.   On: 11/02/2022 10:30   DG Chest Portable 1 View  Result Date: 11/02/2022 CLINICAL DATA:  Chest pain EXAM: PORTABLE CHEST 1 VIEW COMPARISON:  03/11/2022 FINDINGS: Artifact from EKG leads. Normal heart size and mediastinal contours. No acute infiltrate or edema. No effusion or pneumothorax. No acute osseous findings. IMPRESSION: No evidence of active disease. Electronically Signed   By: Tiburcio Pea M.D.   On: 11/02/2022 08:40    EKG: Independently reviewed.  Initial EKG with sinus rhythm at 67 bpm.  Low voltage.    Repeat EKG showed tachycardia as high as 120 bpm this either sinus or junctional tachycardia.  QTc prolonged at 553.  Low voltage multiple leads.  Assessment/Plan Active Problems:   Diabetes (HCC)   High blood pressure   Atherosclerosis of coronary artery   Obesity   Chronic kidney disease, stage 3b (HCC)   Gout   Hyperlipidemia   Polyneuropathy due to type 2 diabetes mellitus (HCC)   Chest pain   Chest pain Rule out MI > Presenting with sudden onset epigastric/lower chest pain.  Episode of diaphoresis but no nausea.  Improved with GI cocktail in the ED. > Troponin negative x 2.  This was repeated after episode below and has been negative x 1 with fourth troponin pending. > Plan was to send patient home however had an episode of tachycardia and skin became flushed. > Cardiology consulted and recommended monitoring on telemetry overnight with echocardiogram. > Patient also  prescribed PPI and Carafate which we will order and patient.  Concern for pud versus gastritis versus GERD. - Monitor on telemetry overnight - Appreciate cardiology recommendations - Follow-up final troponin - Echocardiogram - Twice daily PPI - Carafate  Hypertension - Continue home lisinopril  Hyperlipidemia - Continue home rosuvastatin  Diabetes > 90 units daily at home. - 60 units daily - SSI  Gout - Continue home allopurinol  CAD > Elevated calcium score in the past and coronary atherosclerosis on  CT today. > Nonobstructive CAD noted on cath in 2022. > Chest pain rule out as above - Continue home ASA  CKD3b > Creatinine stable in ED - Trend renal function and electrolytes  Obesity - Noted  DVT prophylaxis: Lovenox Code Status:   Full Family Communication:  None on admission  Disposition Plan:   Patient is from:  Home  Anticipated DC to:  Home  Anticipated DC date:  1 to 2 days  Anticipated DC barriers: None  Consults called:  Cardiology Admission status:  Observation, telemetry  Severity of Illness: The appropriate patient status for this patient is OBSERVATION. Observation status is judged to be reasonable and necessary in order to provide the required intensity of service to ensure the patient's safety. The patient's presenting symptoms, physical exam findings, and initial radiographic and laboratory data in the context of their medical condition is felt to place them at decreased risk for further clinical deterioration. Furthermore, it is anticipated that the patient will be medically stable for discharge from the hospital within 2 midnights of admission.    Synetta Fail MD [facility]  How to contact the [facility] Attending or Consulting provider 7A - 7P or covering provider during after hours 7P -7A, for this patient?   Check the care team in [facility] and look for a) attending/consulting [facility] provider listed and b) the [facility] team listed Log into  [email or web address] and use [facility] universal password to access. If you do not have the password, please contact the hospital operator. Locate the [facility] provider you are looking for under [facility] and page to a number that you can be directly reached. If you still have difficulty reaching the provider, please page the [facility] (Director on Call) for the Hospitalists listed on amion for assistance.  11/02/2022, 4:56 PM

## 2022-11-02 NOTE — ED Notes (Signed)
Patient transported to CT 

## 2022-11-02 NOTE — Consult Note (Signed)
Cardiology Consultation   Patient ID: Steven Mcclure MRN: [id number]; DOB: October 05, 1950  Admit date: 11/02/2022 Date of Consult: 11/02/2022  PCP:  [name], MD   [facility] Providers Cardiologist:  Donato Schultz, MD   Patient Profile:   Steven Mcclure is a 72 y.o. male with a hx of mild to moderate nonobstructive CAD by heart cath 2022, hypertension, hyperlipidemia, DM, morbid obesity, GERD, and CKD who is being seen 11/02/2022 for the evaluation of chest pain at the request of Dr. Durwin Nora.  History of Present Illness:   Mr. Corner establish care with cardiology 12/2020 following an elevated coronary calcium score that resulted 2200 placing him in the 94th percentile.  Given ongoing chest discomfort, family history of coronary artery disease, and CRF including diabetes, morbid obesity, hyperlipidemia, and hypertension, decision was made to proceed to definitive angiography.  He underwent left heart catheterization 12/24/2020 that revealed mild to moderate scattered nonobstructive CAD, no intervention was performed.   When seen in follow-up in clinic 04/03/2022 he was still working as a Emergency planning/management officer in [employer] but does not do physical activity while at work.  Risk factor modification was continued and he is managed with 81 mg aspirin, Crestor, Zetia, lisinopril.  He [facility] ED 11/02/2022 with chest pain concerning for angina.  He woke up at 4:30 AM today with diffuse lower chest pain radiating to his epigastrium.  He had associated diaphoresis but no nausea, or shortness of breath.  Chest pain was not relieved with Tylenol or Tums.  HST were trended and were negative x 3.  EKG was nonischemic.  EDP planned for discharge home without admission, however when he stood up he became dizzy, nauseous, and reported chest pain.  Due to feeling poorly, decision was made to admit him to medicine service and cardiology was formally consulted.  HST x 3 negative WBC  12.5 Lipase WNL Mg 1.8 sCr 1.31 - baseline   Past Medical History:  Diagnosis Date   Body mass index 40.0-44.9, adult (HCC)    CKD (chronic kidney disease), stage III (HCC)    Coronary atherosclerosis due to severely calcified coronary lesion    Diabetes mellitus without complication (HCC)    ED (erectile dysfunction)    Gout    History of kidney stones    Hx of colonic polyps    Hyperlipidemia    Hypertension    Long-term insulin use in type 2 diabetes (HCC)    Low testosterone in male    Obesity    Vertigo    Vitamin B12 deficiency     Past Surgical History:  Procedure Laterality Date   KIDNEY STONE SURGERY     LEFT HEART CATH AND CORONARY ANGIOGRAPHY N/A 12/24/2020   Procedure: LEFT HEART CATH AND CORONARY ANGIOGRAPHY;  Surgeon: Corky Crafts, MD;  Location: [facility] INVASIVE CV LAB;  Service: Cardiovascular;  Laterality: N/A;     Home Medications:  Prior to Admission medications   Medication Sig Start Date End Date Taking? Authorizing Provider  pantoprazole (PROTONIX) 40 MG tablet Take 1 tablet (40 mg total) by mouth daily. 11/02/22 01/31/23 Yes Gloris Manchester, MD  sucralfate (CARAFATE) 1 GM/10ML suspension Take 10 mLs (1 g total) by mouth with breakfast, with lunch, and with evening meal. 11/02/22  Yes Gloris Manchester, MD  acetaminophen (TYLENOL) 500 MG tablet Take 1,000 mg by mouth every 6 (six) hours as needed for moderate pain or headache.    [provider]  albuterol (VENTOLIN HFA) 108 (  90 Base) MCG/ACT inhaler Inhale 1-2 puffs into the lungs every 6 (six) hours as needed for shortness of breath or wheezing. 06/25/18   [provider]  allopurinol (ZYLOPRIM) 100 MG tablet TAKE 1 TABLET BY MOUTH TWICE A DAY 10/08/22   Hyatt, Max T, DPM  Allopurinol 200 MG TABS Take 200 mg by mouth daily.    [provider]  aspirin EC 81 MG tablet Take 81 mg by mouth daily.    [provider]  BD INSULIN SYRINGE U/F 31G X 5/16" 1 ML MISC  05/05/18   [provider]  docusate sodium (COLACE) 50 MG capsule Take 50 mg by mouth daily.    [provider]  doxycycline (VIBRA-TABS) 100 MG tablet Take 1 tablet (100 mg total) by mouth 2 (two) times daily. 05/16/21   Vivi Barrack, DPM  guaiFENesin-codeine 100-10 MG/5ML syrup Take 5 mLs by mouth every 6 (six) hours as needed for cough. 06/25/18   [provider]  LANTUS 100 UNIT/ML injection Inject 90 Units into the skin daily. 08/28/16   [provider]  lisinopril (ZESTRIL) 20 MG tablet Take 1 tablet (20 mg total) by mouth daily. 04/03/22   Jake Bathe, MD  loratadine (CLARITIN) 10 MG tablet Take 20 mg by mouth daily.    [provider]  metFORMIN (GLUCOPHAGE) 1000 MG tablet Take 1 tablet (1,000 mg total) by mouth daily. 12/26/20   Corky Crafts, MD  Multiple Vitamin (MULTIVITAMIN WITH MINERALS) TABS tablet Take 1 tablet by mouth daily.    [provider]  Omega-3 Fatty Acids (FISH OIL) 1200 MG CAPS Take 1,200 mg by mouth daily.    [provider]  OneTouch Delica Lancets 33G MISC See admin instructions. 12/04/14   [provider]  ONETOUCH VERIO test strip  09/02/16   [provider]  rosuvastatin (CRESTOR) 20 MG tablet Take 1 tablet (20 mg total) by mouth daily. 04/03/22   Jake Bathe, MD  TRULICITY 3 MG/0.5ML SOPN Inject 3 mg as directed every Friday. 07/28/20   [provider]  vitamin B-12 (CYANOCOBALAMIN) 1000 MCG tablet Take 1,000 mcg by mouth daily. 06/19/16   [provider]    Inpatient Medications: Scheduled Meds:  triamcinolone acetonide  10 mg Other Once   Continuous Infusions:  PRN Meds:   Allergies:    Allergies  Allergen Reactions   Atorvastatin     musculoskeletal pain   Rosuvastatin     Other reaction(s): musculoskeletal pain   Hydrocod Poli-Chlorphe Poli Er Rash   Tussin Cough [Dextromethorphan Hbr] Rash    Social History:   Social History   Socioeconomic History    Marital status: Widowed    Spouse name: Not on file   Number of children: Not on file   Years of education: Not on file   Highest education level: Not on file  Occupational History   Not on file  Tobacco Use   Smoking status: Former    Types: Cigarettes    Quit date: 31    Years since quitting: 45.4   Smokeless tobacco: Never  Vaping Use   Vaping Use: Never used  Substance and Sexual Activity   Alcohol use: Yes    Comment: social   Drug use: Never   Sexual activity: Not on file  Other Topics Concern   Not on file  Social History Narrative   Not on file   Social Determinants of Health   Financial Resource Strain: Not  on file  Food Insecurity: No Food Insecurity (05/21/2020)   Hunger Vital Sign    Worried About Running Out of Food in the Last Year: Never true    Ran Out of Food in the Last Year: Never true  Transportation Needs: No Transportation Needs (05/21/2020)   PRAPARE - Administrator, Civil Service (Medical): No    Lack of Transportation (Non-Medical): No  Physical Activity: Not on file  Stress: Not on file  Social Connections: Not on file  Intimate Partner Violence: Not on file    Family History:    Family History  Problem Relation Age of Onset   Diabetes Mother    Diabetes Father      ROS:  Please see the history of present illness.   All other ROS reviewed and negative.     Physical Exam/Data:   Vitals:   11/02/22 1200 11/02/22 1307 11/02/22 1400 11/02/22 1500  BP: (!) 161/79 127/84 (!) 132/59 (!) 142/74  Pulse: 71 (!) 115 (!) 113 (!) 111  Resp: (!) 22 17 (!) 25 (!) 21  Temp:  98.4 F (36.9 C)    TempSrc:      SpO2: 100% 97% 95% 95%  Weight:      Height:       No intake or output data in the 24 hours ending 11/02/22 1532    11/02/2022    7:46 AM 04/03/2022   10:39 AM 03/11/2022   12:26 AM  Last 3 Weights  Weight (lbs) 259 lb 259 lb 273 lb  Weight (kg) 117.482 kg 117.482 kg 123.832 kg     Body mass index is 36.12  kg/m.   Obese male No distress Lungs clear No murmur Abdomen mildly distended and typanitic  Plus one bilateral LE edemal   Relevant CV Studies:  LHC 12/2020:   Mid RCA lesion is 25% stenosed.   2nd Mrg lesion is 25% stenosed.   3rd Mrg lesion is 50% stenosed.   Mid LAD lesion is 25% stenosed.   LV end diastolic pressure is normal.   There is no aortic valve stenosis.   Mild to moderate, scattered, nonobstructive CAD.  Continue preventive therapy.  Laboratory Data:  High Sensitivity Troponin:   Recent Labs  Lab 11/02/22 0816 11/02/22 0953 11/02/22 1334  TROPONINIHS 6 5 12      Chemistry Recent Labs  Lab 11/02/22 0816  NA 134*  K 4.4  CL 99  CO2 25  GLUCOSE 230*  BUN 23  CREATININE 1.31*  CALCIUM 9.4  MG 1.8  GFRNONAA 57*  ANIONGAP 10    Recent Labs  Lab 11/02/22 0816  PROT 7.1  ALBUMIN 3.8  AST 27  ALT 23  ALKPHOS 54  BILITOT 0.7   Lipids No results for input(s): "CHOL", "TRIG", "HDL", "LABVLDL", "LDLCALC", "CHOLHDL" in the last 168 hours.  Hematology Recent Labs  Lab 11/02/22 0816  WBC 12.5*  RBC 5.19  HGB 14.8  HCT 45.5  MCV 87.7  MCH 28.5  MCHC 32.5  RDW 13.7  PLT 209   Thyroid No results for input(s): "TSH", "FREET4" in the last 168 hours.  BNPNo results for input(s): "BNP", "PROBNP" in the last 168 hours.  DDimer No results for input(s): "DDIMER" in the last 168 hours.   Radiology/Studies:  CT Angio Chest/Abd/Pel for Dissection W and/or Wo Contrast  Result Date: 11/02/2022 CLINICAL DATA:  Central chest pain and diaphoresis. EXAM: CT ANGIOGRAPHY CHEST, ABDOMEN AND PELVIS TECHNIQUE: Non-contrast CT of the chest was  initially obtained. Multidetector CT imaging through the chest, abdomen and pelvis was performed using the standard protocol during bolus administration of intravenous contrast. Multiplanar reconstructed images and MIPs were obtained and reviewed to evaluate the vascular anatomy. RADIATION DOSE REDUCTION: This exam was  performed according to the departmental dose-optimization program which includes automated exposure control, adjustment of the mA and/or kV according to patient size and/or use of iterative reconstruction technique. CONTRAST:  OMNIPAQUE IOHEXOL 350 MG/ML SOLN COMPARISON:  Coronary calcium score study on 10/12/2020, CT of the abdomen and pelvis without contrast on 07/10/2010, CTA of the chest on 12/21/2009 FINDINGS: CTA CHEST FINDINGS Cardiovascular: Atherosclerosis of the thoracic aorta without evidence of aneurysm or dissection. Visualized proximal great vessels demonstrate normal patency and branching anatomy. The heart size is within normal limits. Extensive calcified coronary artery plaque again noted in a 3 vessel distribution. No pericardial fluid identified. Central pulmonary arteries are normal in caliber. Mediastinum/Nodes: No enlarged mediastinal, hilar, or axillary lymph nodes. Thyroid gland, trachea, and esophagus demonstrate no significant findings. Lungs/Pleura: There is no evidence of pulmonary edema, consolidation, pneumothorax, nodule or pleural fluid. Musculoskeletal: No chest wall abnormality. No acute or significant osseous findings. Review of the MIP images confirms the above findings. CTA ABDOMEN AND PELVIS FINDINGS VASCULAR Aorta: Atherosclerosis of the abdominal aorta without evidence of aneurysm or dissection. Celiac: Normally patent. Normally patent branch vessels and branch vessel anatomy. SMA: Normally patent. Renals: Single right and paired left renal arteries demonstrate normal patency. IMA: Normally patent. Inflow: Atherosclerosis of bilateral proximal common iliac arteries without stenosis. Bilateral iliac arteries demonstrate no aneurysmal disease or significant obstructive disease. Bilateral common femoral arteries and femoral bifurcations demonstrate normal patency. Review of the MIP images confirms the above findings. NON-VASCULAR Hepatobiliary: Liver demonstrates evidence  of diffuse steatosis. No overt cirrhotic morphology. Multiple gallstones within the gallbladder. No overt gallbladder inflammation or evidence of biliary ductal dilatation. Pancreas: Unremarkable. No pancreatic ductal dilatation or surrounding inflammatory changes. Spleen: Normal in size without focal abnormality. Adrenals/Urinary Tract: Normal adrenal glands. Cluster of nonobstructing calculi in the lower pole of the right kidney measuring up to approximately 10 mm. No ureteral or bladder abnormalities. Stomach/Bowel: No oral contrast administered. Bowel shows no evidence of obstruction, ileus, inflammation or lesion. The appendix is normal. No free intraperitoneal air. Diffuse diverticulosis of the lower descending and entire sigmoid colon without evidence of diverticulitis. The entire colon is very redundant. Lymphatic: No lymphadenopathy identified in the abdomen or pelvis. Reproductive: Prostate is unremarkable. Other: No abdominal wall hernia or abnormality. No abdominopelvic ascites. Musculoskeletal: No acute or significant osseous findings. Review of the MIP images confirms the above findings. IMPRESSION: 1. No acute findings in the chest, abdomen or pelvis. 2. Atherosclerosis of the thoracic and abdominal aorta without evidence of aneurysm or dissection. 3. Significant coronary atherosclerosis with calcified coronary artery plaque in a 3 vessel distribution. This has been documented previously by coronary calcium score study in 2022. 4. Hepatic steatosis. 5. Cholelithiasis without evidence of cholecystitis or biliary obstruction. 6. Cluster of nonobstructing calculi in the lower pole of the right kidney measuring up to 10 mm. 7. Diverticulosis of the lower descending and entire sigmoid colon without evidence of diverticulitis. Aortic Atherosclerosis (ICD10-I70.0). Electronically Signed   By: Irish Lack M.D.   On: 11/02/2022 10:30   DG Chest Portable 1 View  Result Date: 11/02/2022 CLINICAL DATA:   Chest pain EXAM: PORTABLE CHEST 1 VIEW COMPARISON:  03/11/2022 FINDINGS: Artifact from EKG leads. Normal heart size and mediastinal  contours. No acute infiltrate or edema. No effusion or pneumothorax. No acute osseous findings. IMPRESSION: No evidence of active disease. Electronically Signed   By: Tiburcio Pea M.D.   On: 11/02/2022 08:40     Assessment and Plan:   Chest pain - HS Troponin x 4 negative - EKG nonischemic - chest pain recurred after standing up in the ER - elevated coronary calcium score but reassuring LHC in 2022 - PTA: ASA, crestor, zetia, and lisinopril - will observe overnight on telemetry  - echo pending   Hypertension PTA 20 mg lisinopril  Hypertensive on arrival, improved Follow trend   Hyperlipidemia with LDL goal < 55 Needs updated lipid panel Given DM, would keep LDL under 55 Continue crestor and zetia   CKD II Creatinine at baseline   DM Do not see a recent A1c On insuline Defer to primary   For questions or updates, please contact [facility] Please consult [email or web address] for contact info under    Signed, Marcelino Duster, [place]  11/02/2022 3:32 PM  Patient examined chart reviewed. Discussed care with patient and PA Exam with obese male Feels much better after GI cocktail Pain is epigastric with no acute ECG changes and negative troponins No obstructive CAD by cath in 2022 Sees [facility] with history of colonic polyp. Would Rx with proton pump inhibiter and ? Carafate GI consult for possible EGD. Echo to make sure EF normal  CTA no acute pathology with no pericardial effusion or aortic dissection Continue home Rx for HTN and HLD   Charlton Haws MD [facility]

## 2022-11-02 NOTE — ED Notes (Signed)
ED TO INPATIENT HANDOFF REPORT  ED Nurse Name and Phone #: Virgilio Belling Name/Age/Gender Steven Mcclure 72 y.o. male Room/Bed: RESUSC/RESUSC  Code Status   Code Status: Full Code  Home/SNF/Other Home Patient oriented to: self, place, time, and situation Is this baseline? Yes   Triage Complete: Triage complete  Chief Complaint Chest pain, rule out acute myocardial infarction [R07.9]  Triage Note Pt arrived POV from home c/o centralized CP that started around 430am this morning. Pt denies any radiation to his arms but states some in his back. Pt denies any N/V or SHOB. Pt has been diaphoretic.    Allergies Allergies  Allergen Reactions   Atorvastatin     musculoskeletal pain   Rosuvastatin     Other reaction(s): musculoskeletal pain   Hydrocod Poli-Chlorphe Poli Er Rash   Tussin Cough [Dextromethorphan Hbr] Rash    Level of Care/Admitting Diagnosis ED Disposition     ED Disposition  Admit   Condition  --   Comment  Hospital Area: [facility] [[id number]]  Level of Care: Telemetry Cardiac [[id number]]  May place patient in observation at [facility] or [facility] if equivalent level of care is available:: No  Covid Evaluation: Asymptomatic - no recent exposure (last 10 days) testing not required  Diagnosis: Chest pain, rule out acute myocardial infarction [[id number]]  Admitting Physician: Synetta Fail [[id number]]  Attending Physician: Synetta Fail [id number]          B Medical/Surgery History Past Medical History:  Diagnosis Date   Body mass index 40.0-44.9, adult (HCC)    CKD (chronic kidney disease), stage III (HCC)    Coronary atherosclerosis due to severely calcified coronary lesion    Diabetes mellitus without complication (HCC)    ED (erectile dysfunction)    Gout    History of kidney stones    Hx of colonic polyps    Hyperlipidemia    Hypertension    Long-term insulin use in type 2 diabetes (HCC)    Low testosterone in male     Obesity    Vertigo    Vitamin B12 deficiency    Past Surgical History:  Procedure Laterality Date   KIDNEY STONE SURGERY     LEFT HEART CATH AND CORONARY ANGIOGRAPHY N/A 12/24/2020   Procedure: LEFT HEART CATH AND CORONARY ANGIOGRAPHY;  Surgeon: Corky Crafts, MD;  Location: [facility] INVASIVE CV LAB;  Service: Cardiovascular;  Laterality: N/A;     A IV Location/Drains/Wounds Patient Lines/Drains/Airways Status     Active Line/Drains/Airways     Name Placement date Placement time Site Days   Peripheral IV 11/02/22 20 G Left;Posterior Forearm 11/02/22  0813  Forearm  less than 1            Intake/Output Last 24 hours No intake or output data in the 24 hours ending 11/02/22 1709  Labs/Imaging Results for orders placed or performed during the hospital encounter of 11/02/22 (from the past 48 hour(s))  CBG monitoring, ED     Status: Abnormal   Collection Time: 11/02/22  8:16 AM  Result Value Ref Range   Glucose-Capillary 224 (H) 70 - 99 mg/dL    Comment: Glucose reference range applies only to samples taken after fasting for at least 8 hours.  Troponin I (High Sensitivity)     Status: None   Collection Time: 11/02/22  8:16 AM  Result Value Ref Range   Troponin I (High Sensitivity) 6 <18 ng/L    Comment: (  NOTE) Elevated high sensitivity troponin I (hsTnI) values and significant  changes across serial measurements may suggest ACS but many other  chronic and acute conditions are known to elevate hsTnI results.  Refer to the "Links" section for chest pain algorithms and additional  guidance. Performed at [facility], [address] [place], [place] [address]   Comprehensive metabolic panel     Status: Abnormal   Collection Time: 11/02/22  8:16 AM  Result Value Ref Range   Sodium 134 (L) 135 - 145 mmol/L   Potassium 4.4 3.5 - 5.1 mmol/L   Chloride 99 98 - 111 mmol/L   CO2 25 22 - 32 mmol/L   Glucose, Bld 230 (H) 70 - 99 mg/dL    Comment: Glucose reference range  applies only to samples taken after fasting for at least 8 hours.   BUN 23 8 - 23 mg/dL   Creatinine, Ser 6.96 (H) 0.61 - 1.24 mg/dL   Calcium 9.4 8.9 - 29.5 mg/dL   Total Protein 7.1 6.5 - 8.1 g/dL   Albumin 3.8 3.5 - 5.0 g/dL   AST 27 15 - 41 U/L   ALT 23 0 - 44 U/L   Alkaline Phosphatase 54 38 - 126 U/L   Total Bilirubin 0.7 0.3 - 1.2 mg/dL   GFR, Estimated 57 (L) >60 mL/min    Comment: (NOTE) Calculated using the CKD-EPI Creatinine Equation (2021)    Anion gap 10 5 - 15    Comment: Performed at [facility], [address] [place], [place] [address]  Magnesium     Status: None   Collection Time: 11/02/22  8:16 AM  Result Value Ref Range   Magnesium 1.8 1.7 - 2.4 mg/dL    Comment: Performed at [facility], [address], [place], [place] [address]  Lipase, blood     Status: None   Collection Time: 11/02/22  8:16 AM  Result Value Ref Range   Lipase 34 11 - 51 U/L    Comment: Performed at [facility], [address] [place], [place] [address]  CBC with Differential/Platelet     Status: Abnormal   Collection Time: 11/02/22  8:16 AM  Result Value Ref Range   WBC 12.5 (H) 4.0 - 10.5 K/uL   RBC 5.19 4.22 - 5.81 MIL/uL   Hemoglobin 14.8 13.0 - 17.0 g/dL   HCT 36.6 44.0 - 34.7 %   MCV 87.7 80.0 - 100.0 fL   MCH 28.5 26.0 - 34.0 pg   MCHC 32.5 30.0 - 36.0 g/dL   RDW 42.5 95.6 - 38.7 %   Platelets 209 150 - 400 K/uL   nRBC 0.0 0.0 - 0.2 %   Neutrophils Relative % 72 %   Neutro Abs 9.0 (H) 1.7 - 7.7 K/uL   Lymphocytes Relative 19 %   Lymphs Abs 2.4 0.7 - 4.0 K/uL   Monocytes Relative 6 %   Monocytes Absolute 0.7 0.1 - 1.0 K/uL   Eosinophils Relative 2 %   Eosinophils Absolute 0.2 0.0 - 0.5 K/uL   Basophils Relative 1 %   Basophils Absolute 0.1 0.0 - 0.1 K/uL   Immature Granulocytes 0 %   Abs Immature Granulocytes 0.05 0.00 - 0.07 K/uL    Comment: Performed at [facility], [address], [place], [place] [address]  Protime-INR     Status: None    Collection Time: 11/02/22  8:16 AM  Result Value Ref Range  Prothrombin Time 13.5 11.4 - 15.2 seconds   INR 1.0 0.8 - 1.2    Comment: (NOTE) INR goal varies based on device and disease states. Performed at [facility], [address], [place], [place] [address]   Troponin I (High Sensitivity)     Status: None   Collection Time: 11/02/22  9:53 AM  Result Value Ref Range   Troponin I (High Sensitivity) 5 <18 ng/L    Comment: (NOTE) Elevated high sensitivity troponin I (hsTnI) values and significant  changes across serial measurements may suggest ACS but many other  chronic and acute conditions are known to elevate hsTnI results.  Refer to the "Links" section for chest pain algorithms and additional  guidance. Performed at [facility], [address] [place], [place] [address]   Troponin I (High Sensitivity)     Status: None   Collection Time: 11/02/22  1:34 PM  Result Value Ref Range   Troponin I (High Sensitivity) 12 <18 ng/L    Comment: (NOTE) Elevated high sensitivity troponin I (hsTnI) values and significant  changes across serial measurements may suggest ACS but many other  chronic and acute conditions are known to elevate hsTnI results.  Refer to the "Links" section for chest pain algorithms and additional  guidance. Performed at [facility], [address], [place], [place] [address]    CT Angio Chest/Abd/Pel for Dissection W and/or Wo Contrast  Result Date: 11/02/2022 CLINICAL DATA:  Central chest pain and diaphoresis. EXAM: CT ANGIOGRAPHY CHEST, ABDOMEN AND PELVIS TECHNIQUE: Non-contrast CT of the chest was initially obtained. Multidetector CT imaging through the chest, abdomen and pelvis was performed using the standard protocol during bolus administration of intravenous contrast. Multiplanar reconstructed images and MIPs were obtained and reviewed to evaluate the vascular anatomy. RADIATION DOSE REDUCTION: This exam was performed according to  the departmental dose-optimization program which includes automated exposure control, adjustment of the mA and/or kV according to patient size and/or use of iterative reconstruction technique. CONTRAST:  OMNIPAQUE IOHEXOL 350 MG/ML SOLN COMPARISON:  Coronary calcium score study on 10/12/2020, CT of the abdomen and pelvis without contrast on 07/10/2010, CTA of the chest on 12/21/2009 FINDINGS: CTA CHEST FINDINGS Cardiovascular: Atherosclerosis of the thoracic aorta without evidence of aneurysm or dissection. Visualized proximal great vessels demonstrate normal patency and branching anatomy. The heart size is within normal limits. Extensive calcified coronary artery plaque again noted in a 3 vessel distribution. No pericardial fluid identified. Central pulmonary arteries are normal in caliber. Mediastinum/Nodes: No enlarged mediastinal, hilar, or axillary lymph nodes. Thyroid gland, trachea, and esophagus demonstrate no significant findings. Lungs/Pleura: There is no evidence of pulmonary edema, consolidation, pneumothorax, nodule or pleural fluid. Musculoskeletal: No chest wall abnormality. No acute or significant osseous findings. Review of the MIP images confirms the above findings. CTA ABDOMEN AND PELVIS FINDINGS VASCULAR Aorta: Atherosclerosis of the abdominal aorta without evidence of aneurysm or dissection. Celiac: Normally patent. Normally patent branch vessels and branch vessel anatomy. SMA: Normally patent. Renals: Single right and paired left renal arteries demonstrate normal patency. IMA: Normally patent. Inflow: Atherosclerosis of bilateral proximal common iliac arteries without stenosis. Bilateral iliac arteries demonstrate no aneurysmal disease or significant obstructive disease. Bilateral common femoral arteries and femoral bifurcations demonstrate normal patency. Review of the MIP images confirms the above findings. NON-VASCULAR Hepatobiliary: Liver demonstrates evidence of diffuse steatosis. No  overt cirrhotic morphology. Multiple gallstones within the gallbladder. No overt gallbladder inflammation or evidence of biliary ductal dilatation. Pancreas:  Unremarkable. No pancreatic ductal dilatation or surrounding inflammatory changes. Spleen: Normal in size without focal abnormality. Adrenals/Urinary Tract: Normal adrenal glands. Cluster of nonobstructing calculi in the lower pole of the right kidney measuring up to approximately 10 mm. No ureteral or bladder abnormalities. Stomach/Bowel: No oral contrast administered. Bowel shows no evidence of obstruction, ileus, inflammation or lesion. The appendix is normal. No free intraperitoneal air. Diffuse diverticulosis of the lower descending and entire sigmoid colon without evidence of diverticulitis. The entire colon is very redundant. Lymphatic: No lymphadenopathy identified in the abdomen or pelvis. Reproductive: Prostate is unremarkable. Other: No abdominal wall hernia or abnormality. No abdominopelvic ascites. Musculoskeletal: No acute or significant osseous findings. Review of the MIP images confirms the above findings. IMPRESSION: 1. No acute findings in the chest, abdomen or pelvis. 2. Atherosclerosis of the thoracic and abdominal aorta without evidence of aneurysm or dissection. 3. Significant coronary atherosclerosis with calcified coronary artery plaque in a 3 vessel distribution. This has been documented previously by coronary calcium score study in 2022. 4. Hepatic steatosis. 5. Cholelithiasis without evidence of cholecystitis or biliary obstruction. 6. Cluster of nonobstructing calculi in the lower pole of the right kidney measuring up to 10 mm. 7. Diverticulosis of the lower descending and entire sigmoid colon without evidence of diverticulitis. Aortic Atherosclerosis (ICD10-I70.0). Electronically Signed   By: Irish Lack M.D.   On: 11/02/2022 10:30   DG Chest Portable 1 View  Result Date: 11/02/2022 CLINICAL DATA:  Chest pain EXAM: PORTABLE  CHEST 1 VIEW COMPARISON:  03/11/2022 FINDINGS: Artifact from EKG leads. Normal heart size and mediastinal contours. No acute infiltrate or edema. No effusion or pneumothorax. No acute osseous findings. IMPRESSION: No evidence of active disease. Electronically Signed   By: Tiburcio Pea M.D.   On: 11/02/2022 08:40    Pending Labs Unresulted Labs (From admission, onward)     Start     Ordered   11/09/22 0500  Creatinine, serum  (enoxaparin (LOVENOX)    CrCl >/= 30 ml/min)  Weekly,   R     Comments: while on enoxaparin therapy    11/02/22 1657            Vitals/Pain Today's Vitals   11/02/22 1500 11/02/22 1515 11/02/22 1545 11/02/22 1615  BP: (!) 142/74 (!) 151/64 (!) 151/63 (!) 165/67  Pulse: (!) 111 (!) 109 (!) 107 (!) 108  Resp: (!) 21 (!) 22 19 20   Temp:    99.5 F (37.5 C)  TempSrc:    Oral  SpO2: 95% 96% 94% 93%  Weight:      Height:      PainSc:        Isolation Precautions No active isolations  Medications Medications  aspirin EC tablet 81 mg (has no administration in time range)  allopurinol (ZYLOPRIM) tablet 100 mg (has no administration in time range)  lisinopril (ZESTRIL) tablet 20 mg (has no administration in time range)  rosuvastatin (CRESTOR) tablet 20 mg (has no administration in time range)  albuterol (PROVENTIL) (2.5 MG/3ML) 0.083% nebulizer solution 3 mL (has no administration in time range)  acetaminophen (TYLENOL) tablet 650 mg (has no administration in time range)  ondansetron (ZOFRAN) injection 4 mg (has no administration in time range)  enoxaparin (LOVENOX) injection 40 mg (has no administration in time range)  aspirin chewable tablet 324 mg (324 mg Oral Given 11/02/22 0830)  nitroGLYCERIN (NITROGLYN) 2 % ointment 1 inch (1 inch Topical Given 11/02/22 0830)  fentaNYL (SUBLIMAZE) injection 100 mcg (100 mcg Intravenous  Given 11/02/22 0830)  fentaNYL (SUBLIMAZE) injection 100 mcg (100 mcg Intravenous Given 11/02/22 0936)  alum & mag hydroxide-simeth  (MAALOX/MYLANTA) 200-200-20 MG/5ML suspension 30 mL (30 mLs Oral Given 11/02/22 0936)    And  lidocaine (XYLOCAINE) 2 % viscous mouth solution 15 mL (15 mLs Oral Given 11/02/22 0936)  iohexol (OMNIPAQUE) 350 MG/ML injection 100 mL (100 mLs Intravenous Contrast Given 11/02/22 1014)  lactated ringers bolus 500 mL (0 mLs Intravenous Stopped 11/02/22 1621)    Mobility walks     Focused Assessments Cardiac Assessment Handoff:  Cardiac Rhythm: Normal sinus rhythm Lab Results  Component Value Date   CKTOTAL 75 12/21/2009   CKMB 0.9 12/21/2009   TROPONINI <0.01        NO INDICATION OF MYOCARDIAL INJURY. 12/21/2009   Lab Results  Component Value Date   DDIMER (H) 12/21/2009    3.76        AT THE INHOUSE ESTABLISHED CUTOFF VALUE OF 0.48 ug/mL FEU, THIS ASSAY HAS BEEN DOCUMENTED IN THE LITERATURE TO HAVE A SENSITIVITY AND NEGATIVE PREDICTIVE VALUE OF AT LEAST 98 TO 99%.  THE TEST RESULT SHOULD BE CORRELATED WITH AN ASSESSMENT OF THE CLINICAL PROBABILITY OF DVT / VTE.   Does the Patient currently have chest pain? No    R Recommendations: See Admitting Provider Note  Report given to:   Additional Notes:

## 2022-11-03 ENCOUNTER — Observation Stay (HOSPITAL_BASED_OUTPATIENT_CLINIC_OR_DEPARTMENT_OTHER): Payer: PPO

## 2022-11-03 ENCOUNTER — Inpatient Hospital Stay (HOSPITAL_COMMUNITY): Payer: PPO

## 2022-11-03 DIAGNOSIS — K819 Cholecystitis, unspecified: Secondary | ICD-10-CM | POA: Diagnosis not present

## 2022-11-03 DIAGNOSIS — R062 Wheezing: Secondary | ICD-10-CM | POA: Diagnosis not present

## 2022-11-03 DIAGNOSIS — Z885 Allergy status to narcotic agent status: Secondary | ICD-10-CM | POA: Diagnosis not present

## 2022-11-03 DIAGNOSIS — M109 Gout, unspecified: Secondary | ICD-10-CM | POA: Diagnosis not present

## 2022-11-03 DIAGNOSIS — R079 Chest pain, unspecified: Secondary | ICD-10-CM

## 2022-11-03 DIAGNOSIS — Z6836 Body mass index (BMI) 36.0-36.9, adult: Secondary | ICD-10-CM | POA: Diagnosis not present

## 2022-11-03 DIAGNOSIS — K8020 Calculus of gallbladder without cholecystitis without obstruction: Secondary | ICD-10-CM | POA: Diagnosis present

## 2022-11-03 DIAGNOSIS — K2900 Acute gastritis without bleeding: Secondary | ICD-10-CM | POA: Diagnosis not present

## 2022-11-03 DIAGNOSIS — R0789 Other chest pain: Secondary | ICD-10-CM | POA: Diagnosis not present

## 2022-11-03 DIAGNOSIS — N1832 Chronic kidney disease, stage 3b: Secondary | ICD-10-CM | POA: Diagnosis not present

## 2022-11-03 DIAGNOSIS — Z888 Allergy status to other drugs, medicaments and biological substances status: Secondary | ICD-10-CM | POA: Diagnosis not present

## 2022-11-03 DIAGNOSIS — E1142 Type 2 diabetes mellitus with diabetic polyneuropathy: Secondary | ICD-10-CM | POA: Diagnosis not present

## 2022-11-03 DIAGNOSIS — I129 Hypertensive chronic kidney disease with stage 1 through stage 4 chronic kidney disease, or unspecified chronic kidney disease: Secondary | ICD-10-CM | POA: Diagnosis not present

## 2022-11-03 DIAGNOSIS — N189 Chronic kidney disease, unspecified: Secondary | ICD-10-CM | POA: Diagnosis not present

## 2022-11-03 DIAGNOSIS — R1013 Epigastric pain: Secondary | ICD-10-CM | POA: Diagnosis not present

## 2022-11-03 DIAGNOSIS — R932 Abnormal findings on diagnostic imaging of liver and biliary tract: Secondary | ICD-10-CM | POA: Diagnosis not present

## 2022-11-03 DIAGNOSIS — E1151 Type 2 diabetes mellitus with diabetic peripheral angiopathy without gangrene: Secondary | ICD-10-CM | POA: Diagnosis not present

## 2022-11-03 DIAGNOSIS — N200 Calculus of kidney: Secondary | ICD-10-CM | POA: Diagnosis not present

## 2022-11-03 DIAGNOSIS — I700 Atherosclerosis of aorta: Secondary | ICD-10-CM | POA: Diagnosis not present

## 2022-11-03 DIAGNOSIS — K219 Gastro-esophageal reflux disease without esophagitis: Secondary | ICD-10-CM | POA: Diagnosis not present

## 2022-11-03 DIAGNOSIS — Z87891 Personal history of nicotine dependence: Secondary | ICD-10-CM | POA: Diagnosis not present

## 2022-11-03 DIAGNOSIS — E785 Hyperlipidemia, unspecified: Secondary | ICD-10-CM | POA: Diagnosis not present

## 2022-11-03 DIAGNOSIS — K82A1 Gangrene of gallbladder in cholecystitis: Secondary | ICD-10-CM | POA: Diagnosis not present

## 2022-11-03 DIAGNOSIS — E1122 Type 2 diabetes mellitus with diabetic chronic kidney disease: Secondary | ICD-10-CM | POA: Diagnosis not present

## 2022-11-03 DIAGNOSIS — E1165 Type 2 diabetes mellitus with hyperglycemia: Secondary | ICD-10-CM | POA: Diagnosis not present

## 2022-11-03 DIAGNOSIS — I2510 Atherosclerotic heart disease of native coronary artery without angina pectoris: Secondary | ICD-10-CM

## 2022-11-03 DIAGNOSIS — K760 Fatty (change of) liver, not elsewhere classified: Secondary | ICD-10-CM | POA: Diagnosis not present

## 2022-11-03 DIAGNOSIS — Z794 Long term (current) use of insulin: Secondary | ICD-10-CM | POA: Diagnosis not present

## 2022-11-03 DIAGNOSIS — I10 Essential (primary) hypertension: Secondary | ICD-10-CM | POA: Diagnosis not present

## 2022-11-03 DIAGNOSIS — K8000 Calculus of gallbladder with acute cholecystitis without obstruction: Secondary | ICD-10-CM | POA: Diagnosis not present

## 2022-11-03 DIAGNOSIS — N179 Acute kidney failure, unspecified: Secondary | ICD-10-CM | POA: Diagnosis not present

## 2022-11-03 DIAGNOSIS — R000 Tachycardia, unspecified: Secondary | ICD-10-CM | POA: Diagnosis not present

## 2022-11-03 DIAGNOSIS — K810 Acute cholecystitis: Secondary | ICD-10-CM | POA: Diagnosis not present

## 2022-11-03 LAB — CBC WITH DIFFERENTIAL/PLATELET
Abs Immature Granulocytes: 0 10*3/uL (ref 0.00–0.07)
Basophils Absolute: 0 10*3/uL (ref 0.0–0.1)
Basophils Relative: 0 %
Eosinophils Absolute: 0 10*3/uL (ref 0.0–0.5)
Eosinophils Relative: 0 %
HCT: 43.6 % (ref 39.0–52.0)
Hemoglobin: 14.4 g/dL (ref 13.0–17.0)
Lymphocytes Relative: 5 %
Lymphs Abs: 1.6 10*3/uL (ref 0.7–4.0)
MCH: 28.6 pg (ref 26.0–34.0)
MCHC: 33 g/dL (ref 30.0–36.0)
MCV: 86.5 fL (ref 80.0–100.0)
Monocytes Absolute: 0.7 10*3/uL (ref 0.1–1.0)
Monocytes Relative: 2 %
Neutro Abs: 30.3 10*3/uL — ABNORMAL HIGH (ref 1.7–7.7)
Neutrophils Relative %: 93 %
Platelets: 208 10*3/uL (ref 150–400)
RBC: 5.04 MIL/uL (ref 4.22–5.81)
RDW: 14.5 % (ref 11.5–15.5)
WBC: 32.6 10*3/uL — ABNORMAL HIGH (ref 4.0–10.5)
nRBC: 0 % (ref 0.0–0.2)
nRBC: 0 /100 WBC

## 2022-11-03 LAB — BASIC METABOLIC PANEL
Anion gap: 10 (ref 5–15)
BUN: 28 mg/dL — ABNORMAL HIGH (ref 8–23)
CO2: 26 mmol/L (ref 22–32)
Calcium: 8.8 mg/dL — ABNORMAL LOW (ref 8.9–10.3)
Chloride: 97 mmol/L — ABNORMAL LOW (ref 98–111)
Creatinine, Ser: 1.67 mg/dL — ABNORMAL HIGH (ref 0.61–1.24)
GFR, Estimated: 43 mL/min — ABNORMAL LOW (ref >60)
Glucose, Bld: 242 mg/dL — ABNORMAL HIGH (ref 70–99)
Potassium: 4.5 mmol/L (ref 3.5–5.1)
Sodium: 133 mmol/L — ABNORMAL LOW (ref 135–145)

## 2022-11-03 LAB — GLUCOSE, CAPILLARY
Glucose-Capillary: 213 mg/dL — ABNORMAL HIGH (ref 70–99)
Glucose-Capillary: 215 mg/dL — ABNORMAL HIGH (ref 70–99)
Glucose-Capillary: 245 mg/dL — ABNORMAL HIGH (ref 70–99)
Glucose-Capillary: 248 mg/dL — ABNORMAL HIGH (ref 70–99)
Glucose-Capillary: 248 mg/dL — ABNORMAL HIGH (ref 70–99)
Glucose-Capillary: 256 mg/dL — ABNORMAL HIGH (ref 70–99)
Glucose-Capillary: 312 mg/dL — ABNORMAL HIGH (ref 70–99)

## 2022-11-03 LAB — CBC
HCT: 39.8 % (ref 39.0–52.0)
Hemoglobin: 13.4 g/dL (ref 13.0–17.0)
MCH: 28.9 pg (ref 26.0–34.0)
MCHC: 33.7 g/dL (ref 30.0–36.0)
MCV: 86 fL (ref 80.0–100.0)
Platelets: 195 10*3/uL (ref 150–400)
RBC: 4.63 MIL/uL (ref 4.22–5.81)
RDW: 14.3 % (ref 11.5–15.5)
WBC: 29.1 10*3/uL — ABNORMAL HIGH (ref 4.0–10.5)
nRBC: 0 % (ref 0.0–0.2)

## 2022-11-03 LAB — ECHOCARDIOGRAM COMPLETE
AR max vel: 4.45 cm2
AV Area VTI: 4.25 cm2
AV Area mean vel: 4.25 cm2
AV Mean grad: 3 mmHg
AV Peak grad: 5 mmHg
Ao pk vel: 1.12 m/s
Area-P 1/2: 5.16 cm2
Height: 71 in
S' Lateral: 2.9 cm
Weight: 4144 oz

## 2022-11-03 MED ORDER — SODIUM CHLORIDE 0.9 % IV BOLUS
1000.0000 mL | Freq: Once | INTRAVENOUS | Status: AC
  Administered 2022-11-03: 1000 mL via INTRAVENOUS

## 2022-11-03 MED ORDER — SODIUM CHLORIDE 0.9 % IV SOLN: INTRAVENOUS | Status: DC

## 2022-11-03 MED ORDER — PERFLUTREN LIPID MICROSPHERE
1.0000 mL | INTRAVENOUS | Status: AC | PRN
  Administered 2022-11-03: 3 mL via INTRAVENOUS

## 2022-11-03 MED ORDER — PIPERACILLIN-TAZOBACTAM 3.375 G IVPB
3.3750 g | Freq: Three times a day (TID) | INTRAVENOUS | Status: DC
  Administered 2022-11-03 – 2022-11-05 (×5): 3.375 g via INTRAVENOUS
  Filled 2022-11-03 (×8): qty 50

## 2022-11-03 MED ORDER — LABETALOL HCL 5 MG/ML IV SOLN: 10.0000 mg | INTRAVENOUS | Status: DC | PRN

## 2022-11-03 NOTE — Progress Notes (Signed)
PT Cancellation Note  Patient Details Name: Steven Mcclure MRN: [id number] DOB: 12-17-1950   Cancelled Treatment:    Reason Eval/Treat Not Completed: Patient at procedure or test/unavailable (Pt is off the floor. Will follow up as time allows)   Gladys Damme 11/03/2022, 8:48 AM

## 2022-11-03 NOTE — Progress Notes (Signed)
Rounding Note    Patient Name: Asiah Reynen Date of Encounter: 11/03/2022  [facility] Cardiologist: Donato Schultz, MD   Subjective   Spoke to him in ECHO lab. Feels better. Would like to go home. Abd discomfort has improved.   Inpatient Medications    Scheduled Meds:  allopurinol  100 mg Oral BID   aspirin EC  81 mg Oral Daily   enoxaparin (LOVENOX) injection  40 mg Subcutaneous Q24H   insulin aspart  0-15 Units Subcutaneous TID WC   insulin aspart  0-5 Units Subcutaneous QHS   insulin glargine-yfgn  60 Units Subcutaneous Daily   lisinopril  20 mg Oral Daily   pantoprazole  40 mg Oral BID   rosuvastatin  20 mg Oral Daily   sucralfate  1 g Oral TID WC & HS   Continuous Infusions:  PRN Meds: acetaminophen, albuterol, ondansetron (ZOFRAN) IV   Vital Signs    Vitals:   11/02/22 2011 11/03/22 0006 11/03/22 0405 11/03/22 0720  BP: 135/64 (!) 150/57 135/62   Pulse: (!) 102 (!) 103    Resp: 20     Temp: 100.2 F (37.9 C) 97.9 F (36.6 C) 99.1 F (37.3 C) 99.7 F (37.6 C)  TempSrc:  Oral Oral Oral  SpO2: 90%  92%   Weight:      Height:        Intake/Output Summary (Last 24 hours) at 11/03/2022 0820 Last data filed at 11/03/2022 0406 Gross per 24 hour  Intake --  Output 150 ml  Net -150 ml      11/02/2022    7:46 AM 04/03/2022   10:39 AM 03/11/2022   12:26 AM  Last 3 Weights  Weight (lbs) 259 lb 259 lb 273 lb  Weight (kg) 117.482 kg 117.482 kg 123.832 kg      Telemetry    Sinus rhythm/sinus tachycardia with first-degree AV block- Personally Reviewed  ECG    Sinus rhythm/sinus tachycardia first-degree AV block right bundle branch block no ischemic changes- Personally Reviewed  Physical Exam   GEN: No acute distress.   Neck: No JVD Cardiac: RRR, no murmurs, rubs, or gallops.  Respiratory: Clear to auscultation bilaterally. GI: Soft, nontender, non-distended  MS: No edema; No deformity. Neuro:  Nonfocal  Psych: Normal affect   Labs     High Sensitivity Troponin:   Recent Labs  Lab 11/02/22 0816 11/02/22 0953 11/02/22 1334 11/02/22 1617  TROPONINIHS 6 5 12 12      Chemistry Recent Labs  Lab 11/02/22 0816 11/03/22 0219  NA 134* 133*  K 4.4 4.5  CL 99 97*  CO2 25 26  GLUCOSE 230* 242*  BUN 23 28*  CREATININE 1.31* 1.67*  CALCIUM 9.4 8.8*  MG 1.8  --   PROT 7.1  --   ALBUMIN 3.8  --   AST 27  --   ALT 23  --   ALKPHOS 54  --   BILITOT 0.7  --   GFRNONAA 57* 43*  ANIONGAP 10 10    Lipids No results for input(s): "CHOL", "TRIG", "HDL", "LABVLDL", "LDLCALC", "CHOLHDL" in the last 168 hours.  Hematology Recent Labs  Lab 11/02/22 0816 11/03/22 0219  WBC 12.5* 29.1*  RBC 5.19 4.63  HGB 14.8 13.4  HCT 45.5 39.8  MCV 87.7 86.0  MCH 28.5 28.9  MCHC 32.5 33.7  RDW 13.7 14.3  PLT 209 195   Thyroid No results for input(s): "TSH", "FREET4" in the last 168 hours.  BNPNo results for  input(s): "BNP", "PROBNP" in the last 168 hours.  DDimer No results for input(s): "DDIMER" in the last 168 hours.   Radiology    CT Angio Chest/Abd/Pel for Dissection W and/or Wo Contrast  Result Date: 11/02/2022 CLINICAL DATA:  Central chest pain and diaphoresis. EXAM: CT ANGIOGRAPHY CHEST, ABDOMEN AND PELVIS TECHNIQUE: Non-contrast CT of the chest was initially obtained. Multidetector CT imaging through the chest, abdomen and pelvis was performed using the standard protocol during bolus administration of intravenous contrast. Multiplanar reconstructed images and MIPs were obtained and reviewed to evaluate the vascular anatomy. RADIATION DOSE REDUCTION: This exam was performed according to the departmental dose-optimization program which includes automated exposure control, adjustment of the mA and/or kV according to patient size and/or use of iterative reconstruction technique. CONTRAST:  OMNIPAQUE IOHEXOL 350 MG/ML SOLN COMPARISON:  Coronary calcium score study on 10/12/2020, CT of the abdomen and pelvis without contrast  on 07/10/2010, CTA of the chest on 12/21/2009 FINDINGS: CTA CHEST FINDINGS Cardiovascular: Atherosclerosis of the thoracic aorta without evidence of aneurysm or dissection. Visualized proximal great vessels demonstrate normal patency and branching anatomy. The heart size is within normal limits. Extensive calcified coronary artery plaque again noted in a 3 vessel distribution. No pericardial fluid identified. Central pulmonary arteries are normal in caliber. Mediastinum/Nodes: No enlarged mediastinal, hilar, or axillary lymph nodes. Thyroid gland, trachea, and esophagus demonstrate no significant findings. Lungs/Pleura: There is no evidence of pulmonary edema, consolidation, pneumothorax, nodule or pleural fluid. Musculoskeletal: No chest wall abnormality. No acute or significant osseous findings. Review of the MIP images confirms the above findings. CTA ABDOMEN AND PELVIS FINDINGS VASCULAR Aorta: Atherosclerosis of the abdominal aorta without evidence of aneurysm or dissection. Celiac: Normally patent. Normally patent branch vessels and branch vessel anatomy. SMA: Normally patent. Renals: Single right and paired left renal arteries demonstrate normal patency. IMA: Normally patent. Inflow: Atherosclerosis of bilateral proximal common iliac arteries without stenosis. Bilateral iliac arteries demonstrate no aneurysmal disease or significant obstructive disease. Bilateral common femoral arteries and femoral bifurcations demonstrate normal patency. Review of the MIP images confirms the above findings. NON-VASCULAR Hepatobiliary: Liver demonstrates evidence of diffuse steatosis. No overt cirrhotic morphology. Multiple gallstones within the gallbladder. No overt gallbladder inflammation or evidence of biliary ductal dilatation. Pancreas: Unremarkable. No pancreatic ductal dilatation or surrounding inflammatory changes. Spleen: Normal in size without focal abnormality. Adrenals/Urinary Tract: Normal adrenal glands. Cluster  of nonobstructing calculi in the lower pole of the right kidney measuring up to approximately 10 mm. No ureteral or bladder abnormalities. Stomach/Bowel: No oral contrast administered. Bowel shows no evidence of obstruction, ileus, inflammation or lesion. The appendix is normal. No free intraperitoneal air. Diffuse diverticulosis of the lower descending and entire sigmoid colon without evidence of diverticulitis. The entire colon is very redundant. Lymphatic: No lymphadenopathy identified in the abdomen or pelvis. Reproductive: Prostate is unremarkable. Other: No abdominal wall hernia or abnormality. No abdominopelvic ascites. Musculoskeletal: No acute or significant osseous findings. Review of the MIP images confirms the above findings. IMPRESSION: 1. No acute findings in the chest, abdomen or pelvis. 2. Atherosclerosis of the thoracic and abdominal aorta without evidence of aneurysm or dissection. 3. Significant coronary atherosclerosis with calcified coronary artery plaque in a 3 vessel distribution. This has been documented previously by coronary calcium score study in 2022. 4. Hepatic steatosis. 5. Cholelithiasis without evidence of cholecystitis or biliary obstruction. 6. Cluster of nonobstructing calculi in the lower pole of the right kidney measuring up to 10 mm. 7. Diverticulosis of the lower  descending and entire sigmoid colon without evidence of diverticulitis. Aortic Atherosclerosis (ICD10-I70.0). Electronically Signed   By: Irish Lack M.D.   On: 11/02/2022 10:30   DG Chest Portable 1 View  Result Date: 11/02/2022 CLINICAL DATA:  Chest pain EXAM: PORTABLE CHEST 1 VIEW COMPARISON:  03/11/2022 FINDINGS: Artifact from EKG leads. Normal heart size and mediastinal contours. No acute infiltrate or edema. No effusion or pneumothorax. No acute osseous findings. IMPRESSION: No evidence of active disease. Electronically Signed   By: Tiburcio Pea M.D.   On: 11/02/2022 08:40    Cardiac Studies     LHC 12/2020:   Mid RCA lesion is 25% stenosed.   2nd Mrg lesion is 25% stenosed.   3rd Mrg lesion is 50% stenosed.   Mid LAD lesion is 25% stenosed.   LV end diastolic pressure is normal.   There is no aortic valve stenosis.   Mild to moderate, scattered, nonobstructive CAD.  Continue preventive therapy.  Diagnostic Dominance: Right       Patient Profile     72 y.o. 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  Assessment & Plan    Chest pain/coronary artery disease - Previous coronary calcium score 2200, catheterization 12/24/2020 showed mild to moderate at the most scattered nonobstructive CAD with no interventions.  He was managed with aggressive secondary risk factor prevention with aspirin and Crestor Zetia lisinopril. -Presented 11/02/2022 with anginal type symptoms diffuse lower chest pain radiating to his epigastrium.  Troponins were negative x 3 EKG nonischemic.  Right before he was about to go home, he stood up felt nauseous dizzy and then reported chest pain again.  Admission was decided upon. -Creatinine 1.31 on admission -Agree with consultative note that since patient felt much better after GI cocktail and pain was epigastric with negative troponins and recent cardiac catheterization that a cardiac etiology was less likely. -Echo was ordered to make sure ejection fraction was normal. -CT scan showed no acute pathology with no pericardial effusion no aortic dissection. -No further cardiac workup.   Hypertension Hyperlipidemia Diabetes -Home regimen.  Tachycardia noted, question metabolic.  If echocardiogram is reassuring, okay to discharge from cardiac perspective.  Continue GI treatment.  Continue follow-up with primary care physician. We will go ahead and sign off.  Donato Schultz, MD         For questions or updates,  please contact [facility] Please consult [email or web address] for contact info under        Signed, Donato Schultz, MD  11/03/2022, 8:20 AM

## 2022-11-03 NOTE — Consult Note (Signed)
Reason for Consult:cholelithiasis Referring Physician: [name] is an 72 y.o. male.  HPI: 72yo M with PMHx CKD, CAD, DM, and HTN was admitted to the hospital yesterday with chest pain and epigastric pain. W/U included troponins which were unremarkable. He had an echocardiogram. A CT angio C/A/P showed cholelithiasis but no evidence of cholecystitis. It also showed R nephrolithiasis. An abdominal U/S was done today 6 hours ago but has not been read yet. LFTs were WNL yesterday.  I was asked to evaluate his gallbladder. He reports R sided abdominal pain. He also mentioned he was recently on some arthritis medicine he had to stop because it made his LFTs go up.  Past Medical History:  Diagnosis Date   Body mass index 40.0-44.9, adult (HCC)    CKD (chronic kidney disease), stage III (HCC)    Coronary atherosclerosis due to severely calcified coronary lesion    Diabetes mellitus without complication (HCC)    ED (erectile dysfunction)    Gout    History of kidney stones    Hx of colonic polyps    Hyperlipidemia    Hypertension    Long-term insulin use in type 2 diabetes (HCC)    Low testosterone in male    Obesity    Vertigo    Vitamin B12 deficiency     Past Surgical History:  Procedure Laterality Date   KIDNEY STONE SURGERY     LEFT HEART CATH AND CORONARY ANGIOGRAPHY N/A 12/24/2020   Procedure: LEFT HEART CATH AND CORONARY ANGIOGRAPHY;  Surgeon: Corky Crafts, MD;  Location: [facility];  Service: Cardiovascular;  Laterality: N/A;    Family History  Problem Relation Age of Onset   Diabetes Mother    Diabetes Father     Social History:  reports that he quit smoking about 45 years ago. His smoking use included cigarettes. He has never used smokeless tobacco. He reports current alcohol use. He reports that he does not use drugs.  Allergies:  Allergies  Allergen Reactions   Crestor [Rosuvastatin] Other (See Comments)    Myalgias Arthralgia  Pt  currently taking 20mg  QD.   Lipitor [Atorvastatin] Other (See Comments)    Myalgias Arthralgia   Hydrocod Poli-Chlorphe Poli Er Rash   Tussin Cough [Dextromethorphan Hbr] Rash    Medications: I have reviewed the patient's current medications.  Results for orders placed or performed during the hospital encounter of 11/02/22 (from the past 48 hour(s))  CBG monitoring, ED     Status: Abnormal   Collection Time: 11/02/22  8:16 AM  Result Value Ref Range   Glucose-Capillary 224 (H) 70 - 99 mg/dL    Comment: Glucose reference range applies only to samples taken after fasting for at least 8 hours.  Troponin I (High Sensitivity)     Status: None   Collection Time: 11/02/22  8:16 AM  Result Value Ref Range   Troponin I (High Sensitivity) 6 <18 ng/L    Comment: (NOTE) Elevated high sensitivity troponin I (hsTnI) values and significant  changes across serial measurements may suggest ACS but many other  chronic and acute conditions are known to elevate hsTnI results.  Refer to the "Links" section for chest pain algorithms and additional  guidance. Performed at [facility], [address], [place], [place] [address]   Comprehensive metabolic panel     Status: Abnormal   Collection Time: 11/02/22  8:16 AM  Result Value Ref Range   Sodium 134 (L) 135 -  145 mmol/L   Potassium 4.4 3.5 - 5.1 mmol/L   Chloride 99 98 - 111 mmol/L   CO2 25 22 - 32 mmol/L   Glucose, Bld 230 (H) 70 - 99 mg/dL    Comment: Glucose reference range applies only to samples taken after fasting for at least 8 hours.   BUN 23 8 - 23 mg/dL   Creatinine, Ser 1.61 (H) 0.61 - 1.24 mg/dL   Calcium 9.4 8.9 - 09.6 mg/dL   Total Protein 7.1 6.5 - 8.1 g/dL   Albumin 3.8 3.5 - 5.0 g/dL   AST 27 15 - 41 U/L   ALT 23 0 - 44 U/L   Alkaline Phosphatase 54 38 - 126 U/L   Total Bilirubin 0.7 0.3 - 1.2 mg/dL   GFR, Estimated 57 (L) >60 mL/min    Comment: (NOTE) Calculated using the CKD-EPI Creatinine Equation (2021)     Anion gap 10 5 - 15    Comment: Performed at [facility], [address] [place], [place] [address]  Magnesium     Status: None   Collection Time: 11/02/22  8:16 AM  Result Value Ref Range   Magnesium 1.8 1.7 - 2.4 mg/dL    Comment: Performed at [facility], [address], [place], [place] [address]  Lipase, blood     Status: None   Collection Time: 11/02/22  8:16 AM  Result Value Ref Range   Lipase 34 11 - 51 U/L    Comment: Performed at [facility], [address], [place], [place] [address]  CBC with Differential/Platelet     Status: Abnormal   Collection Time: 11/02/22  8:16 AM  Result Value Ref Range   WBC 12.5 (H) 4.0 - 10.5 K/uL   RBC 5.19 4.22 - 5.81 MIL/uL   Hemoglobin 14.8 13.0 - 17.0 g/dL   HCT 95.6 21.3 - 08.6 %   MCV 87.7 80.0 - 100.0 fL   MCH 28.5 26.0 - 34.0 pg   MCHC 32.5 30.0 - 36.0 g/dL   RDW 57.8 46.9 - 62.9 %   Platelets 209 150 - 400 K/uL   nRBC 0.0 0.0 - 0.2 %   Neutrophils Relative % 72 %   Neutro Abs 9.0 (H) 1.7 - 7.7 K/uL   Lymphocytes Relative 19 %   Lymphs Abs 2.4 0.7 - 4.0 K/uL   Monocytes Relative 6 %   Monocytes Absolute 0.7 0.1 - 1.0 K/uL   Eosinophils Relative 2 %   Eosinophils Absolute 0.2 0.0 - 0.5 K/uL   Basophils Relative 1 %   Basophils Absolute 0.1 0.0 - 0.1 K/uL   Immature Granulocytes 0 %   Abs Immature Granulocytes 0.05 0.00 - 0.07 K/uL    Comment: Performed at [facility], [address], [place], [place] [address]  Protime-INR     Status: None   Collection Time: 11/02/22  8:16 AM  Result Value Ref Range   Prothrombin Time 13.5 11.4 - 15.2 seconds   INR 1.0 0.8 - 1.2    Comment: (NOTE) INR goal varies based on device and disease states. Performed at [facility], [address], [place], [place] [address]   Troponin I (High Sensitivity)     Status: None   Collection Time: 11/02/22  9:53 AM  Result Value Ref Range   Troponin I (High Sensitivity) 5 <18 ng/L    Comment: (NOTE) Elevated high  sensitivity troponin I (hsTnI) values and significant  changes across serial measurements may suggest ACS but many other  chronic and acute conditions are known to elevate hsTnI results.  Refer to the "Links" section for chest pain algorithms and additional  guidance. Performed at [facility], [address], [place], [place] [address]   Troponin I (High Sensitivity)     Status: None   Collection Time: 11/02/22  1:34 PM  Result Value Ref Range   Troponin I (High Sensitivity) 12 <18 ng/L    Comment: (NOTE) Elevated high sensitivity troponin I (hsTnI) values and significant  changes across serial measurements may suggest ACS but many other  chronic and acute conditions are known to elevate hsTnI results.  Refer to the "Links" section for chest pain algorithms and additional  guidance. Performed at [facility], [address] [place], [place] [address]   Troponin I (High Sensitivity)     Status: None   Collection Time: 11/02/22  4:17 PM  Result Value Ref Range   Troponin I (High Sensitivity) 12 <18 ng/L    Comment: (NOTE) Elevated high sensitivity troponin I (hsTnI) values and significant  changes across serial measurements may suggest ACS but many other  chronic and acute conditions are known to elevate hsTnI results.  Refer to the "Links" section for chest pain algorithms and additional  guidance. Performed at [facility], [address], [place], [place] [address]   Glucose, capillary     Status: Abnormal   Collection Time: 11/02/22  8:31 PM  Result Value Ref Range   Glucose-Capillary 174 (H) 70 - 99 mg/dL    Comment: Glucose reference range applies only to samples taken after fasting for at least 8 hours.  Glucose, capillary     Status: Abnormal   Collection Time: 11/02/22 11:58 PM  Result Value Ref Range   Glucose-Capillary 256 (H) 70 - 99 mg/dL    Comment: Glucose reference range applies only to samples taken after fasting for at least 8 hours.   CBC     Status: Abnormal   Collection Time: 11/03/22  2:19 AM  Result Value Ref Range   WBC 29.1 (H) 4.0 - 10.5 K/uL   RBC 4.63 4.22 - 5.81 MIL/uL   Hemoglobin 13.4 13.0 - 17.0 g/dL   HCT 13.0 86.5 - 78.4 %   MCV 86.0 80.0 - 100.0 fL   MCH 28.9 26.0 - 34.0 pg   MCHC 33.7 30.0 - 36.0 g/dL   RDW 69.6 29.5 - 28.4 %   Platelets 195 150 - 400 K/uL   nRBC 0.0 0.0 - 0.2 %    Comment: Performed at [facility], [address], [place], [place] [address]  Basic metabolic panel     Status: Abnormal   Collection Time: 11/03/22  2:19 AM  Result Value Ref Range   Sodium 133 (L) 135 - 145 mmol/L   Potassium 4.5 3.5 - 5.1 mmol/L   Chloride 97 (L) 98 - 111 mmol/L   CO2 26 22 - 32 mmol/L   Glucose, Bld 242 (H) 70 - 99 mg/dL    Comment: Glucose reference range applies only to samples taken after fasting for at least 8 hours.   BUN 28 (H) 8 - 23 mg/dL   Creatinine, Ser 0.10 (H) 0.61 - 1.24 mg/dL   Calcium 8.8 (L) 8.9 - 10.3 mg/dL   GFR, Estimated 43 (L) >60 mL/min    Comment: (NOTE) Calculated using the CKD-EPI Creatinine Equation (2021)    Anion  gap 10 5 - 15    Comment: Performed at [facility], [address], [place], [place] [address]  Glucose, capillary     Status: Abnormal   Collection Time: 11/03/22  7:31 AM  Result Value Ref Range   Glucose-Capillary 245 (H) 70 - 99 mg/dL    Comment: Glucose reference range applies only to samples taken after fasting for at least 8 hours.  CBC with Differential/Platelet     Status: Abnormal   Collection Time: 11/03/22 10:31 AM  Result Value Ref Range   WBC 32.6 (H) 4.0 - 10.5 K/uL   RBC 5.04 4.22 - 5.81 MIL/uL   Hemoglobin 14.4 13.0 - 17.0 g/dL   HCT 40.9 81.1 - 91.4 %   MCV 86.5 80.0 - 100.0 fL   MCH 28.6 26.0 - 34.0 pg   MCHC 33.0 30.0 - 36.0 g/dL   RDW 78.2 95.6 - 21.3 %   Platelets 208 150 - 400 K/uL   nRBC 0.0 0.0 - 0.2 %   Neutrophils Relative % 93 %   Neutro Abs 30.3 (H) 1.7 - 7.7 K/uL   Lymphocytes Relative 5 %    Lymphs Abs 1.6 0.7 - 4.0 K/uL   Monocytes Relative 2 %   Monocytes Absolute 0.7 0.1 - 1.0 K/uL   Eosinophils Relative 0 %   Eosinophils Absolute 0.0 0.0 - 0.5 K/uL   Basophils Relative 0 %   Basophils Absolute 0.0 0.0 - 0.1 K/uL   nRBC 0 0 /100 WBC   Abs Immature Granulocytes 0.00 0.00 - 0.07 K/uL    Comment: Performed at [facility], [address] [place], [place] [address]  Glucose, capillary     Status: Abnormal   Collection Time: 11/03/22 11:54 AM  Result Value Ref Range   Glucose-Capillary 248 (H) 70 - 99 mg/dL    Comment: Glucose reference range applies only to samples taken after fasting for at least 8 hours.  Glucose, capillary     Status: Abnormal   Collection Time: 11/03/22  3:45 PM  Result Value Ref Range   Glucose-Capillary 248 (H) 70 - 99 mg/dL    Comment: Glucose reference range applies only to samples taken after fasting for at least 8 hours.  Glucose, capillary     Status: Abnormal   Collection Time: 11/03/22  4:49 PM  Result Value Ref Range   Glucose-Capillary 215 (H) 70 - 99 mg/dL    Comment: Glucose reference range applies only to samples taken after fasting for at least 8 hours.  Glucose, capillary     Status: Abnormal   Collection Time: 11/03/22  7:00 PM  Result Value Ref Range   Glucose-Capillary 213 (H) 70 - 99 mg/dL    Comment: Glucose reference range applies only to samples taken after fasting for at least 8 hours.    ECHOCARDIOGRAM COMPLETE  Result Date: 11/03/2022    ECHOCARDIOGRAM REPORT   Patient Name:   Steven Mcclure Date of Exam: 11/03/2022 Medical Rec #:  [id number]     Height:       71.0 in Accession #:    [id number]    Weight:       259.0 lb Date of Birth:  November 07, 1950     BSA:          2.353 m Patient Age:    71 years      BP:           135/62 mmHg Patient Gender: M  HR:           115 bpm. Exam Location:  Inpatient Procedure: 2D Echo, Cardiac Doppler, Color Doppler and Intracardiac            Opacification Agent Indications:     Chest Pain R07.9  History:        Patient has no prior history of Echocardiogram examinations. CKD                 3, Signs/Symptoms:Chest Pain; Risk Factors:Hypertension,                 Dyslipidemia, Former Smoker and Diabetes.  Sonographer:    Dondra Prader RVT RCS Referring Phys: [id number] Cecille Po MELVIN  Sonographer Comments: Technically challenging study due to limited acoustic windows, Technically difficult study due to poor echo windows, suboptimal parasternal window, suboptimal apical window, suboptimal subcostal window and patient is obese. Image acquisition challenging due to patient body habitus. IMPRESSIONS  1. Left ventricular ejection fraction, by estimation, is 65 to 70%. The left ventricle has normal function. The left ventricle has no regional wall motion abnormalities. There is mild left ventricular hypertrophy. Indeterminate diastolic filling due to E-A fusion.  2. Right ventricular systolic function is normal. The right ventricular size is normal. Tricuspid regurgitation signal is inadequate for assessing PA pressure.  3. The mitral valve is normal in structure. Trivial mitral valve regurgitation. No evidence of mitral stenosis.  4. The aortic valve is grossly normal. There is mild calcification of the aortic valve. Aortic valve regurgitation is not visualized. No aortic stenosis is present.  5. The inferior vena cava is normal in size with greater than 50% respiratory variability, suggesting right atrial pressure of 3 mmHg. FINDINGS  Left Ventricle: Left ventricular ejection fraction, by estimation, is 65 to 70%. The left ventricle has normal function. The left ventricle has no regional wall motion abnormalities. Definity contrast agent was given IV to delineate the left ventricular  endocardial borders. The left ventricular internal cavity size was normal in size. There is mild left ventricular hypertrophy. Indeterminate diastolic filling due to E-A fusion. Right Ventricle: The right  ventricular size is normal. No increase in right ventricular wall thickness. Right ventricular systolic function is normal. Tricuspid regurgitation signal is inadequate for assessing PA pressure. Left Atrium: Left atrial size was normal in size. Right Atrium: Right atrial size was normal in size. Pericardium: There is no evidence of pericardial effusion. Mitral Valve: The mitral valve is normal in structure. Trivial mitral valve regurgitation. No evidence of mitral valve stenosis. Tricuspid Valve: The tricuspid valve is normal in structure. Tricuspid valve regurgitation is trivial. No evidence of tricuspid stenosis. Aortic Valve: The aortic valve is grossly normal. There is mild calcification of the aortic valve. Aortic valve regurgitation is not visualized. No aortic stenosis is present. Aortic valve mean gradient measures 3.0 mmHg. Aortic valve peak gradient measures 5.0 mmHg. Aortic valve area, by VTI measures 4.25 cm. Pulmonic Valve: The pulmonic valve was normal in structure. Pulmonic valve regurgitation is not visualized. No evidence of pulmonic stenosis. Aorta: The aortic root is normal in size and structure. Venous: The inferior vena cava is normal in size with greater than 50% respiratory variability, suggesting right atrial pressure of 3 mmHg. IAS/Shunts: The atrial septum is grossly normal.  LEFT VENTRICLE PLAX 2D LVIDd:         4.60 cm LVIDs:         2.90 cm LV PW:  1.20 cm LV IVS:        1.10 cm LVOT diam:     2.20 cm LV SV:         68 LV SV Index:   29 LVOT Area:     3.80 cm  RIGHT VENTRICLE             IVC RV Basal diam:  4.40 cm     IVC diam: 1.40 cm RV Mid diam:    4.30 cm RV S prime:     16.60 cm/s TAPSE (M-mode): 2.1 cm LEFT ATRIUM             Index        RIGHT ATRIUM           Index LA diam:        3.80 cm 1.61 cm/m   RA Area:     14.80 cm LA Vol (A2C):   29.9 ml 12.70 ml/m  RA Volume:   36.30 ml  15.42 ml/m LA Vol (A4C):   36.2 ml 15.38 ml/m LA Biplane Vol: 33.5 ml 14.23 ml/m   AORTIC VALVE                    PULMONIC VALVE AV Area (Vmax):    4.45 cm     PV Vmax:       1.03 m/s AV Area (Vmean):   4.25 cm     PV Peak grad:  4.2 mmHg AV Area (VTI):     4.25 cm AV Vmax:           112.00 cm/s AV Vmean:          73.800 cm/s AV VTI:            0.160 m AV Peak Grad:      5.0 mmHg AV Mean Grad:      3.0 mmHg LVOT Vmax:         131.00 cm/s LVOT Vmean:        82.500 cm/s LVOT VTI:          0.179 m LVOT/AV VTI ratio: 1.12  AORTA Ao Root diam: 3.40 cm Ao Asc diam:  3.60 cm MITRAL VALVE MV Area (PHT): 5.16 cm     SHUNTS MV Decel Time: 147 msec     Systemic VTI:  0.18 m MV E velocity: 143.00 cm/s  Systemic Diam: 2.20 cm Weston Brass MD Electronically signed by Weston Brass MD Signature Date/Time: 11/03/2022/9:58:47 AM    Final    CT Angio Chest/Abd/Pel for Dissection W and/or Wo Contrast  Result Date: 11/02/2022 CLINICAL DATA:  Central chest pain and diaphoresis. EXAM: CT ANGIOGRAPHY CHEST, ABDOMEN AND PELVIS TECHNIQUE: Non-contrast CT of the chest was initially obtained. Multidetector CT imaging through the chest, abdomen and pelvis was performed using the standard protocol during bolus administration of intravenous contrast. Multiplanar reconstructed images and MIPs were obtained and reviewed to evaluate the vascular anatomy. RADIATION DOSE REDUCTION: This exam was performed according to the departmental dose-optimization program which includes automated exposure control, adjustment of the mA and/or kV according to patient size and/or use of iterative reconstruction technique. CONTRAST:  OMNIPAQUE IOHEXOL 350 MG/ML SOLN COMPARISON:  Coronary calcium score study on 10/12/2020, CT of the abdomen and pelvis without contrast on 07/10/2010, CTA of the chest on 12/21/2009 FINDINGS: CTA CHEST FINDINGS Cardiovascular: Atherosclerosis of the thoracic aorta without evidence of aneurysm or dissection. Visualized proximal great vessels demonstrate normal patency and branching anatomy. The heart  size is within normal limits. Extensive calcified coronary artery plaque again noted in a 3 vessel distribution. No pericardial fluid identified. Central pulmonary arteries are normal in caliber. Mediastinum/Nodes: No enlarged mediastinal, hilar, or axillary lymph nodes. Thyroid gland, trachea, and esophagus demonstrate no significant findings. Lungs/Pleura: There is no evidence of pulmonary edema, consolidation, pneumothorax, nodule or pleural fluid. Musculoskeletal: No chest wall abnormality. No acute or significant osseous findings. Review of the MIP images confirms the above findings. CTA ABDOMEN AND PELVIS FINDINGS VASCULAR Aorta: Atherosclerosis of the abdominal aorta without evidence of aneurysm or dissection. Celiac: Normally patent. Normally patent branch vessels and branch vessel anatomy. SMA: Normally patent. Renals: Single right and paired left renal arteries demonstrate normal patency. IMA: Normally patent. Inflow: Atherosclerosis of bilateral proximal common iliac arteries without stenosis. Bilateral iliac arteries demonstrate no aneurysmal disease or significant obstructive disease. Bilateral common femoral arteries and femoral bifurcations demonstrate normal patency. Review of the MIP images confirms the above findings. NON-VASCULAR Hepatobiliary: Liver demonstrates evidence of diffuse steatosis. No overt cirrhotic morphology. Multiple gallstones within the gallbladder. No overt gallbladder inflammation or evidence of biliary ductal dilatation. Pancreas: Unremarkable. No pancreatic ductal dilatation or surrounding inflammatory changes. Spleen: Normal in size without focal abnormality. Adrenals/Urinary Tract: Normal adrenal glands. Cluster of nonobstructing calculi in the lower pole of the right kidney measuring up to approximately 10 mm. No ureteral or bladder abnormalities. Stomach/Bowel: No oral contrast administered. Bowel shows no evidence of obstruction, ileus, inflammation or lesion. The  appendix is normal. No free intraperitoneal air. Diffuse diverticulosis of the lower descending and entire sigmoid colon without evidence of diverticulitis. The entire colon is very redundant. Lymphatic: No lymphadenopathy identified in the abdomen or pelvis. Reproductive: Prostate is unremarkable. Other: No abdominal wall hernia or abnormality. No abdominopelvic ascites. Musculoskeletal: No acute or significant osseous findings. Review of the MIP images confirms the above findings. IMPRESSION: 1. No acute findings in the chest, abdomen or pelvis. 2. Atherosclerosis of the thoracic and abdominal aorta without evidence of aneurysm or dissection. 3. Significant coronary atherosclerosis with calcified coronary artery plaque in a 3 vessel distribution. This has been documented previously by coronary calcium score study in 2022. 4. Hepatic steatosis. 5. Cholelithiasis without evidence of cholecystitis or biliary obstruction. 6. Cluster of nonobstructing calculi in the lower pole of the right kidney measuring up to 10 mm. 7. Diverticulosis of the lower descending and entire sigmoid colon without evidence of diverticulitis. Aortic Atherosclerosis (ICD10-I70.0). Electronically Signed   By: Irish Lack M.D.   On: 11/02/2022 10:30   DG Chest Portable 1 View  Result Date: 11/02/2022 CLINICAL DATA:  Chest pain EXAM: PORTABLE CHEST 1 VIEW COMPARISON:  03/11/2022 FINDINGS: Artifact from EKG leads. Normal heart size and mediastinal contours. No acute infiltrate or edema. No effusion or pneumothorax. No acute osseous findings. IMPRESSION: No evidence of active disease. Electronically Signed   By: Tiburcio Pea M.D.   On: 11/02/2022 08:40    Review of Systems  Constitutional:  Positive for appetite change.  HENT: Negative.    Eyes: Negative.   Respiratory: Negative.  Negative for shortness of breath.   Cardiovascular:  Negative for chest pain.  Gastrointestinal:  Positive for abdominal pain. Negative for vomiting.   Endocrine: Negative.   Genitourinary: Negative.   Musculoskeletal: Negative.   Skin: Negative.   Neurological: Negative.   Hematological: Negative.   Psychiatric/Behavioral: Negative.     Blood pressure (!) 147/96, pulse (!) 118, temperature (!) 101.2 F (38.4 C), temperature source Axillary, resp. rate Marland Kitchen)  24, height 5\' 11"  (1.803 m), weight 117.5 kg, SpO2 94 %. Physical Exam Constitutional:      General: He is not in acute distress. Cardiovascular:     Rate and Rhythm: Normal rate and regular rhythm.  Pulmonary:     Effort: Pulmonary effort is normal.     Breath sounds: Normal breath sounds.  Chest:     Chest wall: No tenderness.  Abdominal:     Palpations: Abdomen is soft.     Tenderness: There is abdominal tenderness.     Comments: RUQ tenderness without peritonitis  Skin:    General: Skin is warm.  Neurological:     Mental Status: He is alert and oriented to person, place, and time.  Psychiatric:        Mood and Affect: Mood normal.     Assessment/Plan: Cholelithiasis - U/S pending. Plan NPO except sips with meds at [facility]. On Zosyn. Repeat LFTs in AM. Possible lap chole tomorrow. I D/W him in detail.  Liz Malady 11/03/2022, 7:44 PM

## 2022-11-03 NOTE — Progress Notes (Signed)
MEWS Progress Note  Patient Details Name: Steven Mcclure MRN: [id number] DOB: April 29, 1951 Today's Date: 11/03/2022   MEWS Flowsheet Documentation:  Assess: MEWS Score Temp: (!) 101.2 F (38.4 C) BP: (!) 147/96 MAP (mmHg): 110 Pulse Rate: (!) 118 ECG Heart Rate: (!) 120 Resp: (!) 24 Level of Consciousness: Alert SpO2: 94 % O2 Device: Room Air Assess: MEWS Score MEWS Temp: 1 MEWS Systolic: 0 MEWS Pulse: 2 MEWS RR: 1 MEWS LOC: 0 MEWS Score: 4 MEWS Score Color: Red Assess: SIRS CRITERIA SIRS Temperature : 1 SIRS Respirations : 1 SIRS Pulse: 1 SIRS WBC: 1 SIRS Score Sum : 4 SIRS Temperature : 0 SIRS Pulse: 1 SIRS Respirations : 1 SIRS WBC: 1 SIRS Score Sum : 3 Assess: if the MEWS score is Yellow or Red Were vital signs taken at a resting state?: Yes Focused Assessment: Change from prior assessment (see assessment flowsheet) Does the patient meet 2 or more of the SIRS criteria?: Yes Does the patient have a confirmed or suspected source of infection?: No MEWS guidelines implemented : Yes, red Treat MEWS Interventions: Considered administering scheduled or prn medications/treatments as ordered Take Vital Signs Increase Vital Sign Frequency : Red: Q1hr x2, continue Q4hrs until patient remains green for 12hrs Escalate MEWS: Escalate: Red: Discuss with charge nurse and notify provider. Consider notifying RRT. If remains red for 2 hours consider need for higher level of care Notify: Charge Nurse/RN Name of Charge Nurse/RN Notified: Gaffer Provider Notification Provider Name/Title: Carollee Herter, MD Date Provider Notified: 11/03/22 Time Provider Notified: 1951 Method of Notification: Page Notification Reason: Change in status      Sharlyn Odonnel I Alayzha An 11/03/2022, 7:50 PM

## 2022-11-03 NOTE — Progress Notes (Signed)
PROGRESS NOTE    Steven Mcclure  ZOX:[id number] DOB: 05/06/1951 DOA: 11/02/2022 PCP: [name], MD    Brief Narrative:  72 year old gentleman with history of hypertension, hyperlipidemia, type 2 diabetes, CKD stage IIIb, gout, mild obstructive coronary artery disease presented to the emergency room early morning with sudden onset of chest pain, epigastric discomfort along with the diaphoresis.  Took Tylenol and Tums without relief.  Some relief with GI cocktail in the ER.  With cardiovascular risk factors he was admitted to the hospital.  In the emergency room tachycardic, CBC 12.5.  Troponins negative.  CT scan abdomen pelvis with hepatic steatosis, nonobstructive right renal calculi and gallbladder stones.   Assessment & Plan:   Epigastric and right upper quadrant pain SIRS criteria met due to tachycardia on admission heart rate more than 100, progressive leukocytosis with white cell count 32,000 today.  Clinical evidence and positive Murphy sign on clinical exam. Cholelithiasis on CT scan that was done as angiogram, LFTs normal.  Plan: N.p.o., IV fluids, blood cultures to be drawn, started on IV Zosyn. Patient does have clinical evidence of acute cholecystitis, LFTs are normal.  Will benefit with cholecystectomy.  Will order right upper quadrant ultrasound to better define morphology/gallbladder wall. Surgical consult.  Chest pain: Secondary to gallbladder.  Acute coronary syndrome ruled out.  Nonischemic EKG.  Nonischemic troponins and normal echocardiogram.  Will continue aspirin and beta-blockers.  Acute kidney injury on chronic kidney disease stage IIIb: Baseline creatinine about 1.3.  1.67 today.  Will hold lisinopril and hydrate.  Recheck tomorrow.  Chronic medical issues including Essential hypertension low blood pressure stable on lisinopril.  Hold lisinopril due to AKI. Hyperlipidemia, on statin Type 2 diabetes, on insulin from home.  Continue. Gout, on  allopurinol    DVT prophylaxis: enoxaparin (LOVENOX) injection 40 mg Start: 11/02/22 2000   Code Status: Full code Family Communication: None at the bedside Disposition Plan: Status is: Observation The patient will require care spanning > 2 midnights and should be moved to inpatient because: Likely elevated white count and abdominal tenderness, and will need surgery     Consultants:  Cardiology General surgery, consult sent  Procedures:  None  Antimicrobials:  Zosyn 5/27----   Subjective: Patient seen and examined after cleared by cardiology.  Still has persistent abdominal pain, worse with mobility and mainly on the right upper quadrant.  He had low-grade fever overnight. On further questioning patient always had epigastric and right upper quadrant pain. White count rechecked to verify and is further elevated.  Objective: Vitals:   11/03/22 0405 11/03/22 0720 11/03/22 0958 11/03/22 1159  BP: 135/62  (!) 147/63 (!) 141/61  Pulse:    (!) 103  Resp:    (!) 24  Temp: 99.1 F (37.3 C) 99.7 F (37.6 C)  99.5 F (37.5 C)  TempSrc: Oral Oral  Oral  SpO2: 92%   95%  Weight:      Height:        Intake/Output Summary (Last 24 hours) at 11/03/2022 1333 Last data filed at 11/03/2022 0406 Gross per 24 hour  Intake --  Output 150 ml  Net -150 ml   Filed Weights   11/02/22 0746  Weight: 117.5 kg    Examination:  General exam: Appears calm and comfortable at rest.  Uncomfortable on exam. Respiratory system: No added sounds. Cardiovascular system: S1 & S2 heard, RRR.  Tachycardic. Gastrointestinal system: Soft.  Moderate tenderness right upper quadrant, obese abdomen however he does have positive Murphy sign  on right upper quadrant exam.  Without any rigidity or guarding. Central nervous system: Alert and oriented. No focal neurological deficits. Extremities: Symmetric 5 x 5 power. Skin: No rashes, lesions or ulcers Psychiatry: Judgement and insight appear normal. Mood  & affect appropriate.     Data Reviewed: I have personally reviewed following labs and imaging studies  CBC: Recent Labs  Lab 11/02/22 0816 11/03/22 0219 11/03/22 1031  WBC 12.5* 29.1* 32.6*  NEUTROABS 9.0*  --  30.3*  HGB 14.8 13.4 14.4  HCT 45.5 39.8 43.6  MCV 87.7 86.0 86.5  PLT 209 195 208   Basic Metabolic Panel: Recent Labs  Lab 11/02/22 0816 11/03/22 0219  NA 134* 133*  K 4.4 4.5  CL 99 97*  CO2 25 26  GLUCOSE 230* 242*  BUN 23 28*  CREATININE 1.31* 1.67*  CALCIUM 9.4 8.8*  MG 1.8  --    GFR: Estimated Creatinine Clearance: 51.4 mL/min (A) (by C-G formula based on SCr of 1.67 mg/dL (H)). Liver Function Tests: Recent Labs  Lab 11/02/22 0816  AST 27  ALT 23  ALKPHOS 54  BILITOT 0.7  PROT 7.1  ALBUMIN 3.8   Recent Labs  Lab 11/02/22 0816  LIPASE 34   No results for input(s): "AMMONIA" in the last 168 hours. Coagulation Profile: Recent Labs  Lab 11/02/22 0816  INR 1.0   Cardiac Enzymes: No results for input(s): "CKTOTAL", "CKMB", "CKMBINDEX", "TROPONINI" in the last 168 hours. BNP (last 3 results) No results for input(s): "PROBNP" in the last 8760 hours. HbA1C: No results for input(s): "HGBA1C" in the last 72 hours. CBG: Recent Labs  Lab 11/02/22 0816 11/02/22 2031 11/02/22 2358 11/03/22 0731 11/03/22 1154  GLUCAP 224* 174* 256* 245* 248*   Lipid Profile: No results for input(s): "CHOL", "HDL", "LDLCALC", "TRIG", "CHOLHDL", "LDLDIRECT" in the last 72 hours. Thyroid Function Tests: No results for input(s): "TSH", "T4TOTAL", "FREET4", "T3FREE", "THYROIDAB" in the last 72 hours. Anemia Panel: No results for input(s): "VITAMINB12", "FOLATE", "FERRITIN", "TIBC", "IRON", "RETICCTPCT" in the last 72 hours. Sepsis Labs: No results for input(s): "PROCALCITON", "LATICACIDVEN" in the last 168 hours.  No results found for this or any previous visit (from the past 240 hour(s)).       Radiology Studies: ECHOCARDIOGRAM COMPLETE  Result  Date: 11/03/2022    ECHOCARDIOGRAM REPORT   Patient Name:   Steven Mcclure Date of Exam: 11/03/2022 Medical Rec #:  [id number]     Height:       71.0 in Accession #:    [id number]    Weight:       259.0 lb Date of Birth:  04/09/1951     BSA:          2.353 m Patient Age:    71 years      BP:           135/62 mmHg Patient Gender: M             HR:           115 bpm. Exam Location:  Inpatient Procedure: 2D Echo, Cardiac Doppler, Color Doppler and Intracardiac            Opacification Agent Indications:    Chest Pain R07.9  History:        Patient has no prior history of Echocardiogram examinations. CKD                 3, Signs/Symptoms:Chest Pain; Risk Factors:Hypertension,  Dyslipidemia, Former Smoker and Diabetes.  Sonographer:    Dondra Prader RVT RCS Referring Phys: [id number] Cecille Po MELVIN  Sonographer Comments: Technically challenging study due to limited acoustic windows, Technically difficult study due to poor echo windows, suboptimal parasternal window, suboptimal apical window, suboptimal subcostal window and patient is obese. Image acquisition challenging due to patient body habitus. IMPRESSIONS  1. Left ventricular ejection fraction, by estimation, is 65 to 70%. The left ventricle has normal function. The left ventricle has no regional wall motion abnormalities. There is mild left ventricular hypertrophy. Indeterminate diastolic filling due to E-A fusion.  2. Right ventricular systolic function is normal. The right ventricular size is normal. Tricuspid regurgitation signal is inadequate for assessing PA pressure.  3. The mitral valve is normal in structure. Trivial mitral valve regurgitation. No evidence of mitral stenosis.  4. The aortic valve is grossly normal. There is mild calcification of the aortic valve. Aortic valve regurgitation is not visualized. No aortic stenosis is present.  5. The inferior vena cava is normal in size with greater than 50% respiratory variability, suggesting right  atrial pressure of 3 mmHg. FINDINGS  Left Ventricle: Left ventricular ejection fraction, by estimation, is 65 to 70%. The left ventricle has normal function. The left ventricle has no regional wall motion abnormalities. Definity contrast agent was given IV to delineate the left ventricular  endocardial borders. The left ventricular internal cavity size was normal in size. There is mild left ventricular hypertrophy. Indeterminate diastolic filling due to E-A fusion. Right Ventricle: The right ventricular size is normal. No increase in right ventricular wall thickness. Right ventricular systolic function is normal. Tricuspid regurgitation signal is inadequate for assessing PA pressure. Left Atrium: Left atrial size was normal in size. Right Atrium: Right atrial size was normal in size. Pericardium: There is no evidence of pericardial effusion. Mitral Valve: The mitral valve is normal in structure. Trivial mitral valve regurgitation. No evidence of mitral valve stenosis. Tricuspid Valve: The tricuspid valve is normal in structure. Tricuspid valve regurgitation is trivial. No evidence of tricuspid stenosis. Aortic Valve: The aortic valve is grossly normal. There is mild calcification of the aortic valve. Aortic valve regurgitation is not visualized. No aortic stenosis is present. Aortic valve mean gradient measures 3.0 mmHg. Aortic valve peak gradient measures 5.0 mmHg. Aortic valve area, by VTI measures 4.25 cm. Pulmonic Valve: The pulmonic valve was normal in structure. Pulmonic valve regurgitation is not visualized. No evidence of pulmonic stenosis. Aorta: The aortic root is normal in size and structure. Venous: The inferior vena cava is normal in size with greater than 50% respiratory variability, suggesting right atrial pressure of 3 mmHg. IAS/Shunts: The atrial septum is grossly normal.  LEFT VENTRICLE PLAX 2D LVIDd:         4.60 cm LVIDs:         2.90 cm LV PW:         1.20 cm LV IVS:        1.10 cm LVOT diam:      2.20 cm LV SV:         68 LV SV Index:   29 LVOT Area:     3.80 cm  RIGHT VENTRICLE             IVC RV Basal diam:  4.40 cm     IVC diam: 1.40 cm RV Mid diam:    4.30 cm RV S prime:     16.60 cm/s TAPSE (M-mode): 2.1 cm LEFT ATRIUM  Index        RIGHT ATRIUM           Index LA diam:        3.80 cm 1.61 cm/m   RA Area:     14.80 cm LA Vol (A2C):   29.9 ml 12.70 ml/m  RA Volume:   36.30 ml  15.42 ml/m LA Vol (A4C):   36.2 ml 15.38 ml/m LA Biplane Vol: 33.5 ml 14.23 ml/m  AORTIC VALVE                    PULMONIC VALVE AV Area (Vmax):    4.45 cm     PV Vmax:       1.03 m/s AV Area (Vmean):   4.25 cm     PV Peak grad:  4.2 mmHg AV Area (VTI):     4.25 cm AV Vmax:           112.00 cm/s AV Vmean:          73.800 cm/s AV VTI:            0.160 m AV Peak Grad:      5.0 mmHg AV Mean Grad:      3.0 mmHg LVOT Vmax:         131.00 cm/s LVOT Vmean:        82.500 cm/s LVOT VTI:          0.179 m LVOT/AV VTI ratio: 1.12  AORTA Ao Root diam: 3.40 cm Ao Asc diam:  3.60 cm MITRAL VALVE MV Area (PHT): 5.16 cm     SHUNTS MV Decel Time: 147 msec     Systemic VTI:  0.18 m MV E velocity: 143.00 cm/s  Systemic Diam: 2.20 cm Weston Brass MD Electronically signed by Weston Brass MD Signature Date/Time: 11/03/2022/9:58:47 AM    Final    CT Angio Chest/Abd/Pel for Dissection W and/or Wo Contrast  Result Date: 11/02/2022 CLINICAL DATA:  Central chest pain and diaphoresis. EXAM: CT ANGIOGRAPHY CHEST, ABDOMEN AND PELVIS TECHNIQUE: Non-contrast CT of the chest was initially obtained. Multidetector CT imaging through the chest, abdomen and pelvis was performed using the standard protocol during bolus administration of intravenous contrast. Multiplanar reconstructed images and MIPs were obtained and reviewed to evaluate the vascular anatomy. RADIATION DOSE REDUCTION: This exam was performed according to the departmental dose-optimization program which includes automated exposure control, adjustment of the mA and/or kV  according to patient size and/or use of iterative reconstruction technique. CONTRAST:  OMNIPAQUE IOHEXOL 350 MG/ML SOLN COMPARISON:  Coronary calcium score study on 10/12/2020, CT of the abdomen and pelvis without contrast on 07/10/2010, CTA of the chest on 12/21/2009 FINDINGS: CTA CHEST FINDINGS Cardiovascular: Atherosclerosis of the thoracic aorta without evidence of aneurysm or dissection. Visualized proximal great vessels demonstrate normal patency and branching anatomy. The heart size is within normal limits. Extensive calcified coronary artery plaque again noted in a 3 vessel distribution. No pericardial fluid identified. Central pulmonary arteries are normal in caliber. Mediastinum/Nodes: No enlarged mediastinal, hilar, or axillary lymph nodes. Thyroid gland, trachea, and esophagus demonstrate no significant findings. Lungs/Pleura: There is no evidence of pulmonary edema, consolidation, pneumothorax, nodule or pleural fluid. Musculoskeletal: No chest wall abnormality. No acute or significant osseous findings. Review of the MIP images confirms the above findings. CTA ABDOMEN AND PELVIS FINDINGS VASCULAR Aorta: Atherosclerosis of the abdominal aorta without evidence of aneurysm or dissection. Celiac: Normally patent. Normally patent branch vessels and branch vessel anatomy. SMA: Normally  patent. Renals: Single right and paired left renal arteries demonstrate normal patency. IMA: Normally patent. Inflow: Atherosclerosis of bilateral proximal common iliac arteries without stenosis. Bilateral iliac arteries demonstrate no aneurysmal disease or significant obstructive disease. Bilateral common femoral arteries and femoral bifurcations demonstrate normal patency. Review of the MIP images confirms the above findings. NON-VASCULAR Hepatobiliary: Liver demonstrates evidence of diffuse steatosis. No overt cirrhotic morphology. Multiple gallstones within the gallbladder. No overt gallbladder inflammation or  evidence of biliary ductal dilatation. Pancreas: Unremarkable. No pancreatic ductal dilatation or surrounding inflammatory changes. Spleen: Normal in size without focal abnormality. Adrenals/Urinary Tract: Normal adrenal glands. Cluster of nonobstructing calculi in the lower pole of the right kidney measuring up to approximately 10 mm. No ureteral or bladder abnormalities. Stomach/Bowel: No oral contrast administered. Bowel shows no evidence of obstruction, ileus, inflammation or lesion. The appendix is normal. No free intraperitoneal air. Diffuse diverticulosis of the lower descending and entire sigmoid colon without evidence of diverticulitis. The entire colon is very redundant. Lymphatic: No lymphadenopathy identified in the abdomen or pelvis. Reproductive: Prostate is unremarkable. Other: No abdominal wall hernia or abnormality. No abdominopelvic ascites. Musculoskeletal: No acute or significant osseous findings. Review of the MIP images confirms the above findings. IMPRESSION: 1. No acute findings in the chest, abdomen or pelvis. 2. Atherosclerosis of the thoracic and abdominal aorta without evidence of aneurysm or dissection. 3. Significant coronary atherosclerosis with calcified coronary artery plaque in a 3 vessel distribution. This has been documented previously by coronary calcium score study in 2022. 4. Hepatic steatosis. 5. Cholelithiasis without evidence of cholecystitis or biliary obstruction. 6. Cluster of nonobstructing calculi in the lower pole of the right kidney measuring up to 10 mm. 7. Diverticulosis of the lower descending and entire sigmoid colon without evidence of diverticulitis. Aortic Atherosclerosis (ICD10-I70.0). Electronically Signed   By: Irish Lack M.D.   On: 11/02/2022 10:30   DG Chest Portable 1 View  Result Date: 11/02/2022 CLINICAL DATA:  Chest pain EXAM: PORTABLE CHEST 1 VIEW COMPARISON:  03/11/2022 FINDINGS: Artifact from EKG leads. Normal heart size and mediastinal  contours. No acute infiltrate or edema. No effusion or pneumothorax. No acute osseous findings. IMPRESSION: No evidence of active disease. Electronically Signed   By: Tiburcio Pea M.D.   On: 11/02/2022 08:40        Scheduled Meds:  allopurinol  100 mg Oral BID   aspirin EC  81 mg Oral Daily   enoxaparin (LOVENOX) injection  40 mg Subcutaneous Q24H   insulin aspart  0-15 Units Subcutaneous TID WC   insulin aspart  0-5 Units Subcutaneous QHS   insulin glargine-yfgn  60 Units Subcutaneous Daily   lisinopril  20 mg Oral Daily   pantoprazole  40 mg Oral BID   rosuvastatin  20 mg Oral Daily   sucralfate  1 g Oral TID WC & HS   Continuous Infusions:  sodium chloride 100 mL/hr at 11/03/22 1253   piperacillin-tazobactam (ZOSYN)  IV       LOS: 0 days    Time spent: 40 minutes    Dorcas Carrow, MD [facility] Pager [phone]

## 2022-11-03 NOTE — Evaluation (Signed)
Physical Therapy Evaluation Patient Details Name: Steven Mcclure MRN: [id number] DOB: 11-08-1950 Today's Date: 11/03/2022  History of Present Illness  Pt is a 72 year old male admitted on 11/02/22 with chest pain. Past medical history significant of hypertension, hyperlipidemia, diabetes, CKD 3B, gout, obesity, CAD.  Clinical Impression  Pt presents with admitting diagnosis above. Pt was able to ambulate in hallway with no AD Min G and navigate 5 stairs also at [employer]. Pt had a few minor losses of balance however was mostly able to self correct. Pt educated that he would likely benefit from using RW or SPC at home upon DC based on current mobility status. Pt presents at or near baseline mobility. Pt has no further acute PT needs and will be signing off. Re consult PT if mobility status changes.        Recommendations for follow up therapy are one component of a multi-disciplinary discharge planning process, led by the attending physician.  Recommendations may be updated based on patient status, additional functional criteria and insurance authorization.  Follow Up Recommendations       Assistance Recommended at Discharge Set up Supervision/Assistance  Patient can return home with the following  A little help with walking and/or transfers;Assistance with cooking/housework;Direct supervision/assist for medications management;Assist for transportation;Help with stairs or ramp for entrance    Equipment Recommendations None recommended by PT  Recommendations for Other Services       Functional Status Assessment Patient has had a recent decline in their functional status and demonstrates the ability to make significant improvements in function in a reasonable and predictable amount of time.     Precautions / Restrictions Precautions Precautions: Fall Restrictions Weight Bearing Restrictions: No      Mobility  Bed Mobility Overal bed mobility: Modified Independent              General bed mobility comments: Pt cued for Log roll technique due to abdominal pain.    Transfers Overall transfer level: Needs assistance Equipment used: None Transfers: Sit to/from Stand Sit to Stand: Supervision                Ambulation/Gait Ambulation/Gait assistance: Min guard Gait Distance (Feet): 150 Feet Assistive device: None Gait Pattern/deviations: Wide base of support, Drifts right/left, Decreased stride length, Step-through pattern Gait velocity: decreased     General Gait Details: Pt with a few minor LOB however was mostly able to self correct. Pt states that he stubbed his toes due to the grip socks.  Stairs Stairs: Yes Stairs assistance: Min guard Stair Management: Two rails, Forwards, Alternating pattern Number of Stairs: 5 General stair comments: no LOB noted. Pt with trunk flexed during stairs.  Wheelchair Mobility    Modified Rankin (Stroke Patients Only)       Balance Overall balance assessment: Needs assistance Sitting-balance support: Feet supported Sitting balance-Leahy Scale: Good     Standing balance support: No upper extremity supported, During functional activity Standing balance-Leahy Scale: Fair Standing balance comment: Few minor LOB however pt was mostly able to self correct.                             Pertinent Vitals/Pain Pain Assessment Pain Assessment: No/denies pain    Home Living Family/patient expects to be discharged to:: Private residence Living Arrangements: Alone Available Help at Discharge: Family;Available PRN/intermittently;Friend(s);Neighbor Type of Home: House Home Access: Stairs to enter Entrance Stairs-Rails: Can reach both;Right;Left Entrance Stairs-Number of Steps:  5   Home Layout: One level Home Equipment: Engineer, agricultural (2 wheels);Cane - single point;Grab bars - tub/shower;BSC/3in1      Prior Function Prior Level of Function : Independent/Modified Independent;Driving              Mobility Comments: Ind no AD ADLs Comments: Ind     Hand Dominance   Dominant Hand: Right    Extremity/Trunk Assessment   Upper Extremity Assessment Upper Extremity Assessment: Overall WFL for tasks assessed    Lower Extremity Assessment Lower Extremity Assessment: Overall WFL for tasks assessed    Cervical / Trunk Assessment Cervical / Trunk Assessment: Normal  Communication   Communication: No difficulties  Cognition Arousal/Alertness: Awake/alert Behavior During Therapy: WFL for tasks assessed/performed Overall Cognitive Status: Within Functional Limits for tasks assessed                                          General Comments General comments (skin integrity, edema, etc.): HR up to 127 during ambulation    Exercises     Assessment/Plan    PT Assessment Patient does not need any further PT services  PT Problem List         PT Treatment Interventions      PT Goals (Current goals can be found in the Care Plan section)       Frequency       Co-evaluation               AM-PAC PT "6 Clicks" Mobility  Outcome Measure Help needed turning from your back to your side while in a flat bed without using bedrails?: None Help needed moving from lying on your back to sitting on the side of a flat bed without using bedrails?: None Help needed moving to and from a bed to a chair (including a wheelchair)?: None Help needed standing up from a chair using your arms (e.g., wheelchair or bedside chair)?: A Little Help needed to walk in hospital room?: A Little Help needed climbing 3-5 steps with a railing? : A Little 6 Click Score: 21    End of Session Equipment Utilized During Treatment: Gait belt Activity Tolerance: Patient tolerated treatment well Patient left: in bed;with call bell/phone within reach;with bed alarm set Nurse Communication: Mobility status PT Visit Diagnosis: Other abnormalities of gait and mobility  (R26.89);Unsteadiness on feet (R26.81)    Time: 1610-9604 PT Time Calculation (min) (ACUTE ONLY): 19 min   Charges:   PT Evaluation $PT Eval Low Complexity: 1 Low PT Treatments $Gait Training: 8-22 mins        Shela Nevin, PT, DPT Acute Rehab Services [phone]   Gladys Damme 11/03/2022, 10:02 AM

## 2022-11-03 NOTE — Progress Notes (Signed)
Pharmacy Antibiotic Note  Steven Mcclure is a 72 y.o. male admitted on 11/02/2022 with  intra-abdominal infection .  Pharmacy has been consulted for Zosyn dosing.  Patient with epigastric pain and shortness of breath, is afebrile with WBC 32.6, RR 24, HR 103. Current renal function, Scr 1.67, is above baseline (~1.3-1.5). CT of abdomen shows no acute findings. Cardiac work-up was negative.   Plan: Initiate piperacillin/tazobactam 3.375 g IV q8 hours Monitor renal function, clinical status, and culture data Follow-up de-escalation as able, LOT  Height: 5\' 11"  (180.3 cm) Weight: 117.5 kg (259 lb) IBW/kg (Calculated) : 75.3  Temp (24hrs), Avg:99.4 F (37.4 C), Min:97.9 F (36.6 C), Max:100.2 F (37.9 C)  Recent Labs  Lab 11/02/22 0816 11/03/22 0219 11/03/22 1031  WBC 12.5* 29.1* 32.6*  CREATININE 1.31* 1.67*  --     Estimated Creatinine Clearance: 51.4 mL/min (A) (by C-G formula based on SCr of 1.67 mg/dL (H)).    Allergies  Allergen Reactions   Crestor [Rosuvastatin] Other (See Comments)    Myalgias Arthralgia  Pt currently taking 20mg  QD.   Lipitor [Atorvastatin] Other (See Comments)    Myalgias Arthralgia   Hydrocod Poli-Chlorphe Poli Er Rash   Tussin Cough [Dextromethorphan Hbr] Rash    Antimicrobials this admission: Zosyn 5/27 >>  Dose adjustments this admission:   Microbiology results: 5/27 Bcx: sent  Thank you for involving pharmacy in this patient's care.   Rockwell Alexandria, PharmD PGY1 Pharmacy Resident 11/03/2022 1:23 PM

## 2022-11-04 ENCOUNTER — Encounter (HOSPITAL_COMMUNITY): Payer: Self-pay | Admitting: Internal Medicine

## 2022-11-04 ENCOUNTER — Encounter (HOSPITAL_COMMUNITY)
Admission: EM | Disposition: A | Discharge status: Home / Self Care | Payer: Self-pay | Source: Home / Self Care | Attending: Internal Medicine

## 2022-11-04 ENCOUNTER — Inpatient Hospital Stay (HOSPITAL_COMMUNITY): Payer: PPO | Admitting: Anesthesiology

## 2022-11-04 DIAGNOSIS — I129 Hypertensive chronic kidney disease with stage 1 through stage 4 chronic kidney disease, or unspecified chronic kidney disease: Secondary | ICD-10-CM | POA: Diagnosis not present

## 2022-11-04 DIAGNOSIS — I2510 Atherosclerotic heart disease of native coronary artery without angina pectoris: Secondary | ICD-10-CM | POA: Diagnosis not present

## 2022-11-04 DIAGNOSIS — K810 Acute cholecystitis: Secondary | ICD-10-CM | POA: Diagnosis not present

## 2022-11-04 DIAGNOSIS — R079 Chest pain, unspecified: Secondary | ICD-10-CM | POA: Diagnosis not present

## 2022-11-04 DIAGNOSIS — E1122 Type 2 diabetes mellitus with diabetic chronic kidney disease: Secondary | ICD-10-CM

## 2022-11-04 DIAGNOSIS — N189 Chronic kidney disease, unspecified: Secondary | ICD-10-CM

## 2022-11-04 HISTORY — PX: CHOLECYSTECTOMY: SHX55

## 2022-11-04 LAB — COMPREHENSIVE METABOLIC PANEL
ALT: 25 U/L (ref 0–44)
AST: 28 U/L (ref 15–41)
Albumin: 3 g/dL — ABNORMAL LOW (ref 3.5–5.0)
Alkaline Phosphatase: 55 U/L (ref 38–126)
Anion gap: 8 (ref 5–15)
BUN: 29 mg/dL — ABNORMAL HIGH (ref 8–23)
CO2: 26 mmol/L (ref 22–32)
Calcium: 8.4 mg/dL — ABNORMAL LOW (ref 8.9–10.3)
Chloride: 98 mmol/L (ref 98–111)
Creatinine, Ser: 1.87 mg/dL — ABNORMAL HIGH (ref 0.61–1.24)
GFR, Estimated: 37 mL/min — ABNORMAL LOW (ref >60)
Glucose, Bld: 230 mg/dL — ABNORMAL HIGH (ref 70–99)
Potassium: 4 mmol/L (ref 3.5–5.1)
Sodium: 132 mmol/L — ABNORMAL LOW (ref 135–145)
Total Bilirubin: 1.5 mg/dL — ABNORMAL HIGH (ref 0.3–1.2)
Total Protein: 6.6 g/dL (ref 6.5–8.1)

## 2022-11-04 LAB — CBC WITH DIFFERENTIAL/PLATELET
Abs Immature Granulocytes: 0.46 10*3/uL — ABNORMAL HIGH (ref 0.00–0.07)
Basophils Absolute: 0.1 10*3/uL (ref 0.0–0.1)
Basophils Relative: 0 %
Eosinophils Absolute: 0 10*3/uL (ref 0.0–0.5)
Eosinophils Relative: 0 %
HCT: 42 % (ref 39.0–52.0)
Hemoglobin: 14 g/dL (ref 13.0–17.0)
Immature Granulocytes: 2 %
Lymphocytes Relative: 7 %
Lymphs Abs: 1.9 10*3/uL (ref 0.7–4.0)
MCH: 29.6 pg (ref 26.0–34.0)
MCHC: 33.3 g/dL (ref 30.0–36.0)
MCV: 88.8 fL (ref 80.0–100.0)
Monocytes Absolute: 2.3 10*3/uL — ABNORMAL HIGH (ref 0.1–1.0)
Monocytes Relative: 8 %
Neutro Abs: 23.6 10*3/uL — ABNORMAL HIGH (ref 1.7–7.7)
Neutrophils Relative %: 83 %
Platelets: 169 10*3/uL (ref 150–400)
RBC: 4.73 MIL/uL (ref 4.22–5.81)
RDW: 14.6 % (ref 11.5–15.5)
WBC: 28.3 10*3/uL — ABNORMAL HIGH (ref 4.0–10.5)
nRBC: 0 % (ref 0.0–0.2)

## 2022-11-04 LAB — GLUCOSE, CAPILLARY
Glucose-Capillary: 219 mg/dL — ABNORMAL HIGH (ref 70–99)
Glucose-Capillary: 213 mg/dL — ABNORMAL HIGH (ref 70–99)
Glucose-Capillary: 203 mg/dL — ABNORMAL HIGH (ref 70–99)
Glucose-Capillary: 249 mg/dL — ABNORMAL HIGH (ref 70–99)
Glucose-Capillary: 444 mg/dL — ABNORMAL HIGH (ref 70–99)
Glucose-Capillary: 494 mg/dL — ABNORMAL HIGH (ref 70–99)
Glucose-Capillary: 457 mg/dL — ABNORMAL HIGH (ref 70–99)

## 2022-11-04 LAB — CULTURE, BLOOD (ROUTINE X 2): Culture: NO GROWTH

## 2022-11-04 LAB — SURGICAL PCR SCREEN
MRSA, PCR: NEGATIVE
Staphylococcus aureus: NEGATIVE

## 2022-11-04 LAB — MAGNESIUM: Magnesium: 1.6 mg/dL — ABNORMAL LOW (ref 1.7–2.4)

## 2022-11-04 SURGERY — LAPAROSCOPIC CHOLECYSTECTOMY
Anesthesia: General

## 2022-11-04 MED ORDER — OXYCODONE HCL 5 MG/5ML PO SOLN: 5.0000 mg | Freq: Once | ORAL | Status: DC | PRN

## 2022-11-04 MED ORDER — LACTATED RINGERS IV SOLN: INTRAVENOUS | Status: DC

## 2022-11-04 MED ORDER — SODIUM CHLORIDE 0.9 % IR SOLN
Status: DC | PRN
  Administered 2022-11-04: 1000 mL

## 2022-11-04 MED ORDER — CHLORHEXIDINE GLUCONATE 0.12 % MT SOLN: 15.0000 mL | Freq: Once | OROMUCOSAL | Status: AC

## 2022-11-04 MED ORDER — PROPOFOL 10 MG/ML IV BOLUS
INTRAVENOUS | Status: DC | PRN
  Administered 2022-11-04: 140 mg via INTRAVENOUS

## 2022-11-04 MED ORDER — SUCCINYLCHOLINE CHLORIDE 200 MG/10ML IV SOSY
PREFILLED_SYRINGE | INTRAVENOUS | Status: DC | PRN
  Administered 2022-11-04: 140 mg via INTRAVENOUS

## 2022-11-04 MED ORDER — ONDANSETRON HCL 4 MG/2ML IJ SOLN
INTRAMUSCULAR | Status: DC | PRN
  Administered 2022-11-04: 4 mg via INTRAVENOUS

## 2022-11-04 MED ORDER — SUCCINYLCHOLINE CHLORIDE 200 MG/10ML IV SOSY
PREFILLED_SYRINGE | INTRAVENOUS | Status: AC
  Filled 2022-11-04: qty 10

## 2022-11-04 MED ORDER — ONDANSETRON HCL 4 MG/2ML IJ SOLN: 4.0000 mg | Freq: Once | INTRAMUSCULAR | Status: DC | PRN

## 2022-11-04 MED ORDER — ONDANSETRON HCL 4 MG/2ML IJ SOLN: 4.0000 mg | Freq: Four times a day (QID) | INTRAMUSCULAR | Status: DC | PRN

## 2022-11-04 MED ORDER — BUPIVACAINE-EPINEPHRINE (PF) 0.25% -1:200000 IJ SOLN
INTRAMUSCULAR | Status: AC
  Filled 2022-11-04: qty 30

## 2022-11-04 MED ORDER — DEXAMETHASONE SODIUM PHOSPHATE 10 MG/ML IJ SOLN
INTRAMUSCULAR | Status: DC | PRN
  Administered 2022-11-04: 5 mg via INTRAVENOUS

## 2022-11-04 MED ORDER — ACETAMINOPHEN 160 MG/5ML PO SOLN: 325.0000 mg | ORAL | Status: DC | PRN

## 2022-11-04 MED ORDER — OXYCODONE HCL 5 MG PO TABS: 5.0000 mg | ORAL_TABLET | Freq: Once | ORAL | Status: DC | PRN

## 2022-11-04 MED ORDER — ESMOLOL HCL 100 MG/10ML IV SOLN
INTRAVENOUS | Status: AC
  Filled 2022-11-04: qty 10

## 2022-11-04 MED ORDER — ROCURONIUM BROMIDE 10 MG/ML (PF) SYRINGE
PREFILLED_SYRINGE | INTRAVENOUS | Status: DC | PRN
  Administered 2022-11-04: 10 mg via INTRAVENOUS
  Administered 2022-11-04: 70 mg via INTRAVENOUS

## 2022-11-04 MED ORDER — CHLORHEXIDINE GLUCONATE 0.12 % MT SOLN
OROMUCOSAL | Status: AC
  Administered 2022-11-04: 15 mL via OROMUCOSAL
  Filled 2022-11-04: qty 15

## 2022-11-04 MED ORDER — DOCUSATE SODIUM 100 MG PO CAPS
100.0000 mg | ORAL_CAPSULE | Freq: Two times a day (BID) | ORAL | Status: DC
  Administered 2022-11-04 – 2022-11-05 (×2): 100 mg via ORAL
  Filled 2022-11-04 (×2): qty 1

## 2022-11-04 MED ORDER — INSULIN ASPART 100 UNIT/ML IJ SOLN
INTRAMUSCULAR | Status: DC | PRN
  Administered 2022-11-04: 3 [IU] via SUBCUTANEOUS

## 2022-11-04 MED ORDER — FENTANYL CITRATE (PF) 100 MCG/2ML IJ SOLN: 25.0000 ug | INTRAMUSCULAR | Status: DC | PRN

## 2022-11-04 MED ORDER — PROPOFOL 10 MG/ML IV BOLUS
INTRAVENOUS | Status: AC
  Filled 2022-11-04: qty 20

## 2022-11-04 MED ORDER — SUGAMMADEX SODIUM 200 MG/2ML IV SOLN
INTRAVENOUS | Status: DC | PRN
  Administered 2022-11-04: 100 mg via INTRAVENOUS
  Administered 2022-11-04: 150 mg via INTRAVENOUS

## 2022-11-04 MED ORDER — ALBUMIN HUMAN 5 % IV SOLN: INTRAVENOUS | Status: DC | PRN

## 2022-11-04 MED ORDER — DEXAMETHASONE SODIUM PHOSPHATE 10 MG/ML IJ SOLN
INTRAMUSCULAR | Status: AC
  Filled 2022-11-04: qty 1

## 2022-11-04 MED ORDER — MAGNESIUM SULFATE 2 GM/50ML IV SOLN
2.0000 g | Freq: Once | INTRAVENOUS | Status: AC
  Administered 2022-11-04: 2 g via INTRAVENOUS
  Filled 2022-11-04: qty 50

## 2022-11-04 MED ORDER — FENTANYL CITRATE (PF) 250 MCG/5ML IJ SOLN
INTRAMUSCULAR | Status: DC | PRN
  Administered 2022-11-04 (×2): 50 ug via INTRAVENOUS
  Administered 2022-11-04: 100 ug via INTRAVENOUS

## 2022-11-04 MED ORDER — ROCURONIUM BROMIDE 10 MG/ML (PF) SYRINGE
PREFILLED_SYRINGE | INTRAVENOUS | Status: AC
  Filled 2022-11-04: qty 10

## 2022-11-04 MED ORDER — BUPIVACAINE-EPINEPHRINE 0.25% -1:200000 IJ SOLN
INTRAMUSCULAR | Status: DC | PRN
  Administered 2022-11-04: 30 mL

## 2022-11-04 MED ORDER — ACETAMINOPHEN 10 MG/ML IV SOLN
INTRAVENOUS | Status: DC | PRN
  Administered 2022-11-04: 1000 mg via INTRAVENOUS

## 2022-11-04 MED ORDER — 0.9 % SODIUM CHLORIDE (POUR BTL) OPTIME
TOPICAL | Status: DC | PRN
  Administered 2022-11-04: 1000 mL

## 2022-11-04 MED ORDER — ORAL CARE MOUTH RINSE: 15.0000 mL | Freq: Once | OROMUCOSAL | Status: AC

## 2022-11-04 MED ORDER — INSULIN GLARGINE-YFGN 100 UNIT/ML ~~LOC~~ SOLN
90.0000 [IU] | Freq: Every day | SUBCUTANEOUS | Status: DC
  Administered 2022-11-05: 90 [IU] via SUBCUTANEOUS
  Filled 2022-11-04: qty 0.9

## 2022-11-04 MED ORDER — METHOCARBAMOL 1000 MG/10ML IJ SOLN: 500.0000 mg | Freq: Four times a day (QID) | INTRAVENOUS | Status: DC | PRN

## 2022-11-04 MED ORDER — INDOCYANINE GREEN 25 MG IV SOLR
INTRAVENOUS | Status: DC | PRN
  Administered 2022-11-04: 2.5 mg via INTRAVENOUS

## 2022-11-04 MED ORDER — ACETAMINOPHEN 10 MG/ML IV SOLN
INTRAVENOUS | Status: AC
  Filled 2022-11-04: qty 100

## 2022-11-04 MED ORDER — LIDOCAINE 2% (20 MG/ML) 5 ML SYRINGE
INTRAMUSCULAR | Status: AC
  Filled 2022-11-04: qty 5

## 2022-11-04 MED ORDER — MIDAZOLAM HCL 2 MG/2ML IJ SOLN
INTRAMUSCULAR | Status: DC | PRN
  Administered 2022-11-04: 1 mg via INTRAVENOUS

## 2022-11-04 MED ORDER — FENTANYL CITRATE (PF) 250 MCG/5ML IJ SOLN
INTRAMUSCULAR | Status: AC
  Filled 2022-11-04: qty 5

## 2022-11-04 MED ORDER — MIDAZOLAM HCL 2 MG/2ML IJ SOLN
INTRAMUSCULAR | Status: AC
  Filled 2022-11-04: qty 2

## 2022-11-04 MED ORDER — HYDROMORPHONE HCL 1 MG/ML IJ SOLN: 0.5000 mg | INTRAMUSCULAR | Status: DC | PRN

## 2022-11-04 MED ORDER — INSULIN ASPART 100 UNIT/ML IJ SOLN
10.0000 [IU] | Freq: Once | INTRAMUSCULAR | Status: AC
  Administered 2022-11-04: 10 [IU] via SUBCUTANEOUS

## 2022-11-04 MED ORDER — PHENYLEPHRINE 80 MCG/ML (10ML) SYRINGE FOR IV PUSH (FOR BLOOD PRESSURE SUPPORT)
PREFILLED_SYRINGE | INTRAVENOUS | Status: DC | PRN
  Administered 2022-11-04: 160 ug via INTRAVENOUS
  Administered 2022-11-04: 80 ug via INTRAVENOUS
  Administered 2022-11-04 (×2): 160 ug via INTRAVENOUS
  Administered 2022-11-04: 80 ug via INTRAVENOUS
  Administered 2022-11-04: 160 ug via INTRAVENOUS

## 2022-11-04 MED ORDER — PHENYLEPHRINE 80 MCG/ML (10ML) SYRINGE FOR IV PUSH (FOR BLOOD PRESSURE SUPPORT)
PREFILLED_SYRINGE | INTRAVENOUS | Status: AC
  Filled 2022-11-04: qty 20

## 2022-11-04 MED ORDER — INSULIN ASPART 100 UNIT/ML IJ SOLN: 10.0000 [IU] | Freq: Once | INTRAMUSCULAR | Status: DC

## 2022-11-04 MED ORDER — ONDANSETRON HCL 4 MG/2ML IJ SOLN
INTRAMUSCULAR | Status: AC
  Filled 2022-11-04: qty 2

## 2022-11-04 MED ORDER — ESMOLOL HCL 100 MG/10ML IV SOLN
INTRAVENOUS | Status: DC | PRN
  Administered 2022-11-04: 10 mg via INTRAVENOUS
  Administered 2022-11-04: 20 mg via INTRAVENOUS

## 2022-11-04 MED ORDER — LIDOCAINE 2% (20 MG/ML) 5 ML SYRINGE
INTRAMUSCULAR | Status: DC | PRN
  Administered 2022-11-04: 100 mg via INTRAVENOUS

## 2022-11-04 MED ORDER — PHENYLEPHRINE HCL-NACL 20-0.9 MG/250ML-% IV SOLN
INTRAVENOUS | Status: DC | PRN
  Administered 2022-11-04: 40 ug/min via INTRAVENOUS

## 2022-11-04 MED ORDER — ACETAMINOPHEN 325 MG PO TABS: 325.0000 mg | ORAL_TABLET | ORAL | Status: DC | PRN

## 2022-11-04 MED ORDER — MEPERIDINE HCL 25 MG/ML IJ SOLN: 6.2500 mg | INTRAMUSCULAR | Status: DC | PRN

## 2022-11-04 MED ORDER — OXYCODONE HCL 5 MG PO TABS
5.0000 mg | ORAL_TABLET | ORAL | Status: DC | PRN
  Administered 2022-11-04 – 2022-11-05 (×2): 5 mg via ORAL
  Filled 2022-11-04 (×2): qty 1

## 2022-11-04 MED ORDER — INDOCYANINE GREEN 25 MG IV SOLR: 7.5000 mg | INTRAVENOUS | Status: DC

## 2022-11-04 MED ORDER — INSULIN ASPART 100 UNIT/ML IJ SOLN
0.0000 [IU] | Freq: Three times a day (TID) | INTRAMUSCULAR | Status: DC
  Administered 2022-11-05: 11 [IU] via SUBCUTANEOUS

## 2022-11-04 SURGICAL SUPPLY — 43 items
ADH SKN CLS APL DERMABOND .7 (GAUZE/BANDAGES/DRESSINGS) ×1
APL PRP STRL LF DISP 70% ISPRP (MISCELLANEOUS) ×1
APPLIER CLIP 5 13 M/L LIGAMAX5 (MISCELLANEOUS) ×1
APR CLP MED LRG 5 ANG JAW (MISCELLANEOUS) ×1
BAG COUNTER SPONGE SURGICOUNT (BAG) ×1 IMPLANT
BAG SPNG CNTER NS LX DISP (BAG) ×1
BLADE CLIPPER SURG (BLADE) IMPLANT
CANISTER SUCT 3000ML PPV (MISCELLANEOUS) ×1 IMPLANT
CHLORAPREP W/TINT 26 (MISCELLANEOUS) ×1 IMPLANT
CLIP APPLIE 5 13 M/L LIGAMAX5 (MISCELLANEOUS) ×1 IMPLANT
COVER SURGICAL LIGHT HANDLE (MISCELLANEOUS) ×1 IMPLANT
DERMABOND ADVANCED .7 DNX12 (GAUZE/BANDAGES/DRESSINGS) ×1 IMPLANT
ELECT REM PT RETURN 9FT ADLT (ELECTROSURGICAL) ×1
ELECTRODE REM PT RTRN 9FT ADLT (ELECTROSURGICAL) ×1 IMPLANT
ENDOLOOP SUT PDS II  0 18 (SUTURE) ×2
ENDOLOOP SUT PDS II 0 18 (SUTURE) IMPLANT
GLOVE BIO SURGEON STRL SZ 6 (GLOVE) ×1 IMPLANT
GLOVE INDICATOR 6.5 STRL GRN (GLOVE) ×1 IMPLANT
GOWN STRL REUS W/ TWL LRG LVL3 (GOWN DISPOSABLE) ×3 IMPLANT
GOWN STRL REUS W/TWL LRG LVL3 (GOWN DISPOSABLE) ×3
GRASPER SUT TROCAR 14GX15 (MISCELLANEOUS) ×1 IMPLANT
IRRIG SUCT STRYKERFLOW 2 WTIP (MISCELLANEOUS) ×1
IRRIGATION SUCT STRKRFLW 2 WTP (MISCELLANEOUS) ×1 IMPLANT
KIT BASIN OR (CUSTOM PROCEDURE TRAY) ×1 IMPLANT
KIT TURNOVER KIT B (KITS) ×1 IMPLANT
NDL INSUFFLATION 14GA 120MM (NEEDLE) ×1 IMPLANT
NEEDLE INSUFFLATION 14GA 120MM (NEEDLE) ×1 IMPLANT
NS IRRIG 1000ML POUR BTL (IV SOLUTION) ×1 IMPLANT
PAD ARMBOARD 7.5X6 YLW CONV (MISCELLANEOUS) ×1 IMPLANT
SCISSORS LAP 5X35 DISP (ENDOMECHANICALS) ×1 IMPLANT
SET TUBE SMOKE EVAC HIGH FLOW (TUBING) ×1 IMPLANT
SLEEVE Z-THREAD 5X100MM (TROCAR) ×2 IMPLANT
SPECIMEN JAR SMALL (MISCELLANEOUS) ×1 IMPLANT
SUT MNCRL AB 4-0 PS2 18 (SUTURE) ×1 IMPLANT
SYS BAG RETRIEVAL 10MM (BASKET) ×1
SYSTEM BAG RETRIEVAL 10MM (BASKET) ×1 IMPLANT
TOWEL GREEN STERILE (TOWEL DISPOSABLE) IMPLANT
TOWEL GREEN STERILE FF (TOWEL DISPOSABLE) ×1 IMPLANT
TRAY LAPAROSCOPIC MC (CUSTOM PROCEDURE TRAY) ×1 IMPLANT
TROCAR 11X100 Z THREAD (TROCAR) ×1 IMPLANT
TROCAR Z-THREAD OPTICAL 5X100M (TROCAR) ×1 IMPLANT
WARMER LAPAROSCOPE (MISCELLANEOUS) ×1 IMPLANT
WATER STERILE IRR 1000ML POUR (IV SOLUTION) ×1 IMPLANT

## 2022-11-04 NOTE — Op Note (Signed)
Operative Note  Param Hurd 72 y.o. male [id number]  11/04/2022  Surgeon: Berna Bue MD FACS  Procedure performed: Laparoscopic Cholecystectomy with near-infrared fluorescent cholangiography  Procedure classification: urgent  Preop diagnosis: acute cholecystitis Post-op diagnosis/intraop findings: same, severe with gangrenous changes   Specimens: gallbladder  Retained items: none  EBL: 50cc  Complications: none  Description of procedure: After obtaining informed consent the patient was brought to the operating room. Antibiotics were administered. SCD's were applied. General endotracheal anesthesia was initiated and a formal time-out was performed. The abdomen was prepped and draped in the usual sterile fashion and the abdomen was entered using an infraumbilical veress needle after instilling the site with local. Insufflation to was obtained, 5mm trocar and camera inserted, and gross inspection revealed no evidence of injury from our entry or other intraabdominal abnormalities. Two 5mm trocars were introduced in the right midclavicular and right anterior axillary lines under direct visualization and following infiltration with local. An 11mm trocar was placed in the epigastrium.  The gallbladder was encased in dense omental adhesions with a purulent rind.  This was gently bluntly swept away.  Once exposed, the fundus of the gallbladder was noted to be extremely erythematous and thickened with a tensely distended gallbladder.  The Nezhat aspirator was used to decompress the gallbladder to allow it to be grasped.  The fundus was then able to be retracted cephalad.  Continue gentle blunt dissection ensued until the infundibulum was visualized.  Of note due to the patient's habitus with significant visceral obesity as well as hepatic steatosis the dissection was somewhat difficult.  The infundibulum was retracted laterally. A combination of hook electrocautery and blunt dissection was  utilized to clear the peritoneum from the neck and cystic duct, circumferentially isolating the cystic artery and cystic duct and lifting the gallbladder from the cystic plate. The critical view of safety was achieved with the cystic artery, cystic duct, and liver bed visualized between them with no other structures.  Near infrared fluorescent cholangiography was then enacted however the dye did not illuminate the gallbladder nor the cystic duct.  The liver did illuminate.  The porta/common hepatic and common bile ducts were not visible due to extensive overlying adipose tissue.  The cystic artery was clipped with a single clip proximally and distally and divided.  The cystic duct was somewhat thickened with fibrotic surrounding cicatrix and this was too thick to accommodate the clip. The gallbladder was dissected from the liver plate using electrocautery until the only structure connecting the gallbladder to the patient was the cystic duct.  Two 0 PDS Endoloops were placed across the cystic duct which was then divided just distal to the Endoloops.  The cystic duct caliber which was able to be appreciated after dividing it was quite small with again extensive thickened fibrotic tissue surrounding it. Once freed the gallbladder was placed in an endocatch bag and removed through the epigastric trocar site. A small amount of bleeding on the liver bed was controlled with cautery. Some bile and 2 large stones had been spilled from the gallbladder during its dissection from the liver bed, high up near the fundus.  All spilled stones were evacuated.  The right upper quadrant was irrigated copiously until the effluent was clear. Hemostasis was once again confirmed, and reinspection of the abdomen revealed no injuries. The Endoloops were well apposed without any bile leak from the duct or the liver bed. The 11mm trocar site in the epigastrium was closed with two simple interrupted  0 vicryls in the fascia under direct  visualization using a PMI device. The abdomen was desufflated and all trocars removed. The skin incisions were closed with subcuticular 4-0 monocryl and Dermabond. The patient was awakened, extubated and transported to the recovery room in stable condition.    All counts were correct at the completion of the case.

## 2022-11-04 NOTE — Anesthesia Procedure Notes (Signed)
Procedure Name: Intubation Date/Time: 11/04/2022 10:03 AM  Performed by: Audie Pinto, CRNAPre-anesthesia Checklist: Patient identified, Emergency Drugs available, Suction available and Patient being monitored Patient Re-evaluated:Patient Re-evaluated prior to induction Oxygen Delivery Method: Circle system utilized Preoxygenation: Pre-oxygenation with 100% oxygen Induction Type: IV induction, Rapid sequence and Cricoid Pressure applied Laryngoscope Size: Mac and 4 Grade View: Grade I Tube type: Oral Tube size: 7.5 mm Number of attempts: 1 Airway Equipment and Method: Stylet and Oral airway Placement Confirmation: ETT inserted through vocal cords under direct vision, positive ETCO2 and breath sounds checked- equal and bilateral Secured at: 23 cm Tube secured with: Tape Dental Injury: Teeth and Oropharynx as per pre-operative assessment

## 2022-11-04 NOTE — Transfer of Care (Signed)
Immediate Anesthesia Transfer of Care Note  Patient: Steven Mcclure  Procedure(s) Performed: LAPAROSCOPIC CHOLECYSTECTOMY WITH ICG DYE  Patient Location: PACU  Anesthesia Type:General  Level of Consciousness: drowsy and patient cooperative  Airway & Oxygen Therapy: Patient Spontanous Breathing and Patient connected to face mask oxygen  Post-op Assessment: Report given to RN and Post -op Vital signs reviewed and stable  Post vital signs: Reviewed and stable  Last Vitals:  Vitals Value Taken Time  BP 127/60 11/04/22 1205  Temp    Pulse 98 11/04/22 1208  Resp 21 11/04/22 1208  SpO2 93 % 11/04/22 1208  Vitals shown include unvalidated device data.  Last Pain:  Vitals:   11/04/22 0902  TempSrc: Oral  PainSc: 0-No pain         Complications: No notable events documented.

## 2022-11-04 NOTE — Anesthesia Postprocedure Evaluation (Signed)
Anesthesia Post Note  Patient: Steven Mcclure  Procedure(s) Performed: LAPAROSCOPIC CHOLECYSTECTOMY WITH ICG DYE     Patient location during evaluation: PACU Anesthesia Type: General Level of consciousness: awake and alert Pain management: pain level controlled Vital Signs Assessment: post-procedure vital signs reviewed and stable Respiratory status: spontaneous breathing, nonlabored ventilation, respiratory function stable and patient connected to nasal cannula oxygen Cardiovascular status: blood pressure returned to baseline and stable Postop Assessment: no apparent nausea or vomiting Anesthetic complications: no   No notable events documented.  Last Vitals:  Vitals:   11/04/22 1215 11/04/22 1230  BP: (!) 122/56 120/64  Pulse: 93 91  Resp: 20 20  Temp:  37 C  SpO2: 91% 91%    Last Pain:  Vitals:   11/04/22 1230  TempSrc:   PainSc: 0-No pain                 Joriel Streety

## 2022-11-04 NOTE — Inpatient Diabetes Management (Signed)
Inpatient Diabetes Program Recommendations  AACE/ADA: New Consensus Statement on Inpatient Glycemic Control (2015)  Target Ranges:  Prepandial:   less than 140 mg/dL      Peak postprandial:   less than 180 mg/dL (1-2 hours)      Critically ill patients:  140 - 180 mg/dL   Lab Results  Component Value Date   GLUCAP 219 (H) 11/04/2022    Review of Glycemic Control  Latest Reference Range & Units 11/03/22 07:31 11/03/22 11:54 11/03/22 15:45 11/03/22 16:49 11/03/22 19:00 11/03/22 21:35 11/04/22 08:10  Glucose-Capillary 70 - 99 mg/dL 657 (H) 846 (H) 962 (H) 215 (H) 213 (H) 312 (H) 219 (H)  (H): Data is abnormally high  Diabetes history: DM2 Outpatient Diabetes medications: Semglee 60 units QD, Novolog 0-15 units TID and 0-5 units QHS (all on hold for OR) Current orders for Inpatient glycemic control: Lantus 90 units QD, Trulicity 3 mg weekly, Metformin 1000 mg QD  Inpatient Diabetes Program Recommendations:    Please consider increasing basal insulin:  Semglee 65 units QD  Will continue to follow while inpatient.  Thank you, Dulce Sellar, MSN, CDCES Diabetes Coordinator Inpatient Diabetes Program [phone] (team pager from 8a-5p)

## 2022-11-04 NOTE — Progress Notes (Signed)
PROGRESS NOTE    Steven Mcclure  ZOX:[id number] DOB: [birth year] DOA: 11/02/2022 PCP: [name], MD    Brief Narrative:  72 year old gentleman with history of hypertension, hyperlipidemia, type 2 diabetes, CKD stage IIIb, gout, mild obstructive coronary artery disease presented to the emergency room early morning with sudden onset of chest pain, epigastric discomfort along with the diaphoresis.  Took Tylenol and Tums without relief.  Some relief with GI cocktail in the ER.  With cardiovascular risk factors he was admitted to the hospital.  In the emergency room tachycardic, CBC 12.5.  Troponins negative.  CT scan abdomen pelvis with hepatic steatosis, nonobstructive right renal calculi and gallbladder stones. Admitted with suspected acute coronary syndrome, found to have acute calculus cholecystitis.   Assessment & Plan:   Epigastric and right upper quadrant pain SIRS criteria met due to tachycardia on admission heart rate more than 100, progressive leukocytosis with white cell count 32,000 today.  Clinical evidence and positive Murphy sign on clinical exam. Cholelithiasis on CT scan that was done as angiogram, LFTs normal.  Plan: Treated with IV fluids, blood cultures drawn.  Started on IV Zosyn. Surgery consulted.  Patient is going for cholecystectomy today.  Chest pain: Secondary to gallbladder.  Acute coronary syndrome ruled out.  Nonischemic EKG.  Nonischemic troponins and normal echocardiogram.  Will continue aspirin and beta-blockers.  Acute kidney injury on chronic kidney disease stage IIIb: Baseline creatinine about 1.3.  1.87 today.  Will hold lisinopril and hydrate.  Recheck tomorrow.  Hypomagnesemia: Magnesium replaced.  Chronic medical issues including Essential hypertension low blood pressure stable on lisinopril.  Hold lisinopril due to AKI. Hyperlipidemia, on statin Type 2 diabetes, on insulin from home.  Continue. Gout, on allopurinol    DVT prophylaxis:  enoxaparin (LOVENOX) injection 40 mg Start: 11/02/22 2000   Code Status: Full code Family Communication: None at the bedside Disposition Plan: Status is: Inpatient.  Inpatient procedures anticipated.   Consultants:  Cardiology General surgery,   Procedures:  For cholecystectomy today.  Antimicrobials:  Zosyn 5/27----   Subjective:  Patient seen and examined.  He does not complain much but he still has significant pain on the right upper quadrant and also pain with mobility.  Denies any nausea vomiting.  Denies any chest pain.  Objective: Vitals:   11/03/22 1903 11/03/22 2020 11/04/22 0300 11/04/22 0902  BP: (!) 147/96 135/63 (!) 148/67 (!) 146/68  Pulse: (!) 118 (!) 103 (!) 107 (!) 105  Resp: (!) 24 (!) 25 18 17   Temp: (!) 101.2 F (38.4 C) (!) 97.4 F (36.3 C) 99.1 F (37.3 C) 98.3 F (36.8 C)  TempSrc: Axillary Oral Oral Oral  SpO2: 94% 93% 93% 98%  Weight:      Height:        Intake/Output Summary (Last 24 hours) at 11/04/2022 1021 Last data filed at 11/04/2022 0442 Gross per 24 hour  Intake 2078.77 ml  Output 725 ml  Net 1353.77 ml    Filed Weights   11/02/22 0746  Weight: 117.5 kg    Examination:  General exam: Appears calm and comfortable at rest.  Respiratory system: No added sounds. Cardiovascular system: S1 & S2 heard, RRR.  Tachycardic. Gastrointestinal system: Soft.  Moderate tenderness right upper quadrant, obese abdomen however he does have positive Murphy sign on right upper quadrant exam.  Without any rigidity or guarding. Central nervous system: Alert and oriented. No focal neurological deficits. Extremities: Symmetric 5 x 5 power. Skin: No rashes, lesions or ulcers Psychiatry:  Judgement and insight appear normal. Mood & affect appropriate.     Data Reviewed: I have personally reviewed following labs and imaging studies  CBC: Recent Labs  Lab 11/02/22 0816 11/03/22 0219 11/03/22 1031 11/04/22 0120  WBC 12.5* 29.1* 32.6* 28.3*   NEUTROABS 9.0*  --  30.3* 23.6*  HGB 14.8 13.4 14.4 14.0  HCT 45.5 39.8 43.6 42.0  MCV 87.7 86.0 86.5 88.8  PLT 209 195 208 169    Basic Metabolic Panel: Recent Labs  Lab 11/02/22 0816 11/03/22 0219 11/04/22 0120  NA 134* 133* 132*  K 4.4 4.5 4.0  CL 99 97* 98  CO2 25 26 26   GLUCOSE 230* 242* 230*  BUN 23 28* 29*  CREATININE 1.31* 1.67* 1.87*  CALCIUM 9.4 8.8* 8.4*  MG 1.8  --  1.6*    GFR: Estimated Creatinine Clearance: 45.9 mL/min (A) (by C-G formula based on SCr of 1.87 mg/dL (H)). Liver Function Tests: Recent Labs  Lab 11/02/22 0816 11/04/22 0120  AST 27 28  ALT 23 25  ALKPHOS 54 55  BILITOT 0.7 1.5*  PROT 7.1 6.6  ALBUMIN 3.8 3.0*    Recent Labs  Lab 11/02/22 0816  LIPASE 34    No results for input(s): "AMMONIA" in the last 168 hours. Coagulation Profile: Recent Labs  Lab 11/02/22 0816  INR 1.0    Cardiac Enzymes: No results for input(s): "CKTOTAL", "CKMB", "CKMBINDEX", "TROPONINI" in the last 168 hours. BNP (last 3 results) No results for input(s): "PROBNP" in the last 8760 hours. HbA1C: No results for input(s): "HGBA1C" in the last 72 hours. CBG: Recent Labs  Lab 11/03/22 1545 11/03/22 1649 11/03/22 1900 11/03/22 2135 11/04/22 0810  GLUCAP 248* 215* 213* 312* 219*    Lipid Profile: No results for input(s): "CHOL", "HDL", "LDLCALC", "TRIG", "CHOLHDL", "LDLDIRECT" in the last 72 hours. Thyroid Function Tests: No results for input(s): "TSH", "T4TOTAL", "FREET4", "T3FREE", "THYROIDAB" in the last 72 hours. Anemia Panel: No results for input(s): "VITAMINB12", "FOLATE", "FERRITIN", "TIBC", "IRON", "RETICCTPCT" in the last 72 hours. Sepsis Labs: No results for input(s): "PROCALCITON", "LATICACIDVEN" in the last 168 hours.  Recent Results (from the past 240 hour(s))  Culture, blood (Routine X 2) w Reflex to ID Panel     Status: None (Preliminary result)   Collection Time: 11/03/22  1:28 PM   Specimen: BLOOD RIGHT ARM  Result Value  Ref Range Status   Specimen Description BLOOD RIGHT ARM  Final   Special Requests   Final    BOTTLES DRAWN AEROBIC AND ANAEROBIC Blood Culture adequate volume   Culture   Final    NO GROWTH < 24 HOURS Performed at [facility], [address], [place], [place] [address]    Report Status PENDING  Incomplete  Culture, blood (Routine X 2) w Reflex to ID Panel     Status: None (Preliminary result)   Collection Time: 11/03/22  1:29 PM   Specimen: BLOOD LEFT HAND  Result Value Ref Range Status   Specimen Description BLOOD LEFT HAND  Final   Special Requests   Final    BOTTLES DRAWN AEROBIC AND ANAEROBIC Blood Culture adequate volume   Culture   Final    NO GROWTH < 24 HOURS Performed at [facility], [address] [place], [place] [address]    Report Status PENDING  Incomplete  Surgical pcr screen     Status: None   Collection Time: 11/04/22  8:02 AM   Specimen: Nasal Mucosa; Nasal  Swab  Result Value Ref Range Status   MRSA, PCR NEGATIVE NEGATIVE Final   Staphylococcus aureus NEGATIVE NEGATIVE Final    Comment: (NOTE) The Xpert SA Assay (FDA approved for NASAL specimens in patients 2 years of age and older), is one component of a comprehensive surveillance program. It is not intended to diagnose infection nor to guide or monitor treatment. Performed at [facility], [address], [place], [place] [address]          Radiology Studies: US Abdomen Limited RUQ (LIVER/GB)  Result Date: 11/03/2022 CLINICAL DATA:  Cholelithiasis. EXAM: ULTRASOUND ABDOMEN LIMITED RIGHT UPPER QUADRANT COMPARISON:  None Available. FINDINGS: Gallbladder: Echogenic gallstones and heterogeneous gallbladder sludge are seen within the gallbladder lumen. The gallbladder wall measures 4.6 mm in thickness. A positive sonographic Eulah Pont sign is noted by the sonographer. Common bile duct: Diameter: 5.1 mm Liver: No focal lesion identified. Diffusely increased echogenicity of the liver  parenchyma is noted. Portal vein is patent on color Doppler imaging with normal direction of blood flow towards the liver. Other: Limited study secondary to the patient's body habitus. IMPRESSION: Cholelithiasis and gallbladder sludge, in the setting of a positive sonographic Murphy sign, consistent with acute cholecystitis. Electronically Signed   By: Aram Candela M.D.   On: 11/03/2022 20:18   ECHOCARDIOGRAM COMPLETE  Result Date: 11/03/2022    ECHOCARDIOGRAM REPORT   Patient Name:   RAYDER CREESE Date of Exam: 11/03/2022 Medical Rec #:  [id number]     Height:       71.0 in Accession #:    [id number]    Weight:       259.0 lb Date of Birth:  04-12-1951     BSA:          2.353 m Patient Age:    71 years      BP:           135/62 mmHg Patient Gender: M             HR:           115 bpm. Exam Location:  Inpatient Procedure: 2D Echo, Cardiac Doppler, Color Doppler and Intracardiac            Opacification Agent Indications:    Chest Pain R07.9  History:        Patient has no prior history of Echocardiogram examinations. CKD                 3, Signs/Symptoms:Chest Pain; Risk Factors:Hypertension,                 Dyslipidemia, Former Smoker and Diabetes.  Sonographer:    Dondra Prader RVT RCS Referring Phys: [id number] Cecille Po MELVIN  Sonographer Comments: Technically challenging study due to limited acoustic windows, Technically difficult study due to poor echo windows, suboptimal parasternal window, suboptimal apical window, suboptimal subcostal window and patient is obese. Image acquisition challenging due to patient body habitus. IMPRESSIONS  1. Left ventricular ejection fraction, by estimation, is 65 to 70%. The left ventricle has normal function. The left ventricle has no regional wall motion abnormalities. There is mild left ventricular hypertrophy. Indeterminate diastolic filling due to E-A fusion.  2. Right ventricular systolic function is normal. The right ventricular size is normal. Tricuspid  regurgitation signal is inadequate for assessing PA pressure.  3. The mitral valve is normal in structure. Trivial mitral valve regurgitation. No evidence of mitral stenosis.  4. The aortic valve is grossly normal. There is mild  calcification of the aortic valve. Aortic valve regurgitation is not visualized. No aortic stenosis is present.  5. The inferior vena cava is normal in size with greater than 50% respiratory variability, suggesting right atrial pressure of 3 mmHg. FINDINGS  Left Ventricle: Left ventricular ejection fraction, by estimation, is 65 to 70%. The left ventricle has normal function. The left ventricle has no regional wall motion abnormalities. Definity contrast agent was given IV to delineate the left ventricular  endocardial borders. The left ventricular internal cavity size was normal in size. There is mild left ventricular hypertrophy. Indeterminate diastolic filling due to E-A fusion. Right Ventricle: The right ventricular size is normal. No increase in right ventricular wall thickness. Right ventricular systolic function is normal. Tricuspid regurgitation signal is inadequate for assessing PA pressure. Left Atrium: Left atrial size was normal in size. Right Atrium: Right atrial size was normal in size. Pericardium: There is no evidence of pericardial effusion. Mitral Valve: The mitral valve is normal in structure. Trivial mitral valve regurgitation. No evidence of mitral valve stenosis. Tricuspid Valve: The tricuspid valve is normal in structure. Tricuspid valve regurgitation is trivial. No evidence of tricuspid stenosis. Aortic Valve: The aortic valve is grossly normal. There is mild calcification of the aortic valve. Aortic valve regurgitation is not visualized. No aortic stenosis is present. Aortic valve mean gradient measures 3.0 mmHg. Aortic valve peak gradient measures 5.0 mmHg. Aortic valve area, by VTI measures 4.25 cm. Pulmonic Valve: The pulmonic valve was normal in structure.  Pulmonic valve regurgitation is not visualized. No evidence of pulmonic stenosis. Aorta: The aortic root is normal in size and structure. Venous: The inferior vena cava is normal in size with greater than 50% respiratory variability, suggesting right atrial pressure of 3 mmHg. IAS/Shunts: The atrial septum is grossly normal.  LEFT VENTRICLE PLAX 2D LVIDd:         4.60 cm LVIDs:         2.90 cm LV PW:         1.20 cm LV IVS:        1.10 cm LVOT diam:     2.20 cm LV SV:         68 LV SV Index:   29 LVOT Area:     3.80 cm  RIGHT VENTRICLE             IVC RV Basal diam:  4.40 cm     IVC diam: 1.40 cm RV Mid diam:    4.30 cm RV S prime:     16.60 cm/s TAPSE (M-mode): 2.1 cm LEFT ATRIUM             Index        RIGHT ATRIUM           Index LA diam:        3.80 cm 1.61 cm/m   RA Area:     14.80 cm LA Vol (A2C):   29.9 ml 12.70 ml/m  RA Volume:   36.30 ml  15.42 ml/m LA Vol (A4C):   36.2 ml 15.38 ml/m LA Biplane Vol: 33.5 ml 14.23 ml/m  AORTIC VALVE                    PULMONIC VALVE AV Area (Vmax):    4.45 cm     PV Vmax:       1.03 m/s AV Area (Vmean):   4.25 cm     PV Peak grad:  4.2 mmHg AV Area (  VTI):     4.25 cm AV Vmax:           112.00 cm/s AV Vmean:          73.800 cm/s AV VTI:            0.160 m AV Peak Grad:      5.0 mmHg AV Mean Grad:      3.0 mmHg LVOT Vmax:         131.00 cm/s LVOT Vmean:        82.500 cm/s LVOT VTI:          0.179 m LVOT/AV VTI ratio: 1.12  AORTA Ao Root diam: 3.40 cm Ao Asc diam:  3.60 cm MITRAL VALVE MV Area (PHT): 5.16 cm     SHUNTS MV Decel Time: 147 msec     Systemic VTI:  0.18 m MV E velocity: 143.00 cm/s  Systemic Diam: 2.20 cm Weston Brass MD Electronically signed by Weston Brass MD Signature Date/Time: 11/03/2022/9:58:47 AM    Final         Scheduled Meds:  [MAR Hold] allopurinol  100 mg Oral BID   [MAR Hold] aspirin EC  81 mg Oral Daily   [MAR Hold] enoxaparin (LOVENOX) injection  40 mg Subcutaneous Q24H   [MAR Hold] insulin aspart  0-15 Units Subcutaneous  TID WC   [MAR Hold] insulin aspart  0-5 Units Subcutaneous QHS   [MAR Hold] insulin glargine-yfgn  60 Units Subcutaneous Daily   [MAR Hold] pantoprazole  40 mg Oral BID   [MAR Hold] rosuvastatin  20 mg Oral Daily   [MAR Hold] sucralfate  1 g Oral TID WC & HS   Continuous Infusions:  sodium chloride 100 mL/hr at 11/04/22 0359   lactated ringers 10 mL/hr at 11/04/22 0948   [MAR Hold] piperacillin-tazobactam (ZOSYN)  IV 3.375 g (11/04/22 0553)     LOS: 1 day    Time spent: 35 minutes    Dorcas Carrow, MD [facility] Pager [phone]

## 2022-11-04 NOTE — Anesthesia Preprocedure Evaluation (Addendum)
Anesthesia Evaluation  Patient identified by MRN, date of birth, ID band Patient awake    Reviewed: Allergy & Precautions, H&P , NPO status , Patient's Chart, lab work & pertinent test results  Airway Mallampati: III  TM Distance: >3 FB Neck ROM: Full    Dental no notable dental hx. (+) Teeth Intact, Dental Advisory Given, Poor Dentition, Chipped   Pulmonary neg pulmonary ROS, former smoker   Pulmonary exam normal breath sounds clear to auscultation       Cardiovascular Exercise Tolerance: Good hypertension, + CAD and + DOE  negative cardio ROS Normal cardiovascular exam Rhythm:Regular Rate:Normal     Neuro/Psych  Neuromuscular disease negative neurological ROS  negative psych ROS   GI/Hepatic negative GI ROS, Neg liver ROS,,,  Endo/Other  negative endocrine ROSdiabetes, Insulin Dependent  Morbid obesity  Renal/GU CRFRenal diseasenegative Renal ROS  negative genitourinary   Musculoskeletal negative musculoskeletal ROS (+)    Abdominal   Peds negative pediatric ROS (+)  Hematology negative hematology ROS (+)   Anesthesia Other Findings   Reproductive/Obstetrics negative OB ROS                             Anesthesia Physical Anesthesia Plan  ASA: 3  Anesthesia Plan: General   Post-op Pain Management: Toradol IV (intra-op)* and Ofirmev IV (intra-op)*   Induction: Intravenous and Cricoid pressure planned  PONV Risk Score and Plan: 2 and Ondansetron, Dexamethasone and Treatment may vary due to age or medical condition  Airway Management Planned: Oral ETT and Video Laryngoscope Planned  Additional Equipment: None  Intra-op Plan:   Post-operative Plan: Extubation in OR  Informed Consent: I have reviewed the patients History and Physical, chart, labs and discussed the procedure including the risks, benefits and alternatives for the proposed anesthesia with the patient or authorized  representative who has indicated his/her understanding and acceptance.       Plan Discussed with: Anesthesiologist and CRNA  Anesthesia Plan Comments: (  )       Anesthesia Quick Evaluation

## 2022-11-04 NOTE — Progress Notes (Signed)
Subjective/Chief Complaint: Still significant pain, but a little better than prior. Worse with moving. No appetite.    Objective: Vital signs in last 24 hours: Temp:  [97.4 F (36.3 C)-101.2 F (38.4 C)] 99.1 F (37.3 C) (05/28 0300) Pulse Rate:  [27-118] 107 (05/28 0300) Resp:  [18-28] 18 (05/28 0300) BP: (105-148)/(60-96) 148/67 (05/28 0300) SpO2:  [93 %-100 %] 93 % (05/28 0300) Last BM Date : 11/01/22  Intake/Output from previous day: 05/27 0701 - 05/28 0700 In: 2078.8 [I.V.:1988.8; IV Piggyback:89.9] Out: 725 [Urine:725] Intake/Output this shift: No intake/output data recorded.  Alert, calm Unlabored respirations Sinus tachy @ 107 Abd obese, ttp RUQ   Lab Results:  Recent Labs    11/03/22 1031 11/04/22 0120  WBC 32.6* 28.3*  HGB 14.4 14.0  HCT 43.6 42.0  PLT 208 169   BMET Recent Labs    11/03/22 0219 11/04/22 0120  NA 133* 132*  K 4.5 4.0  CL 97* 98  CO2 26 26  GLUCOSE 242* 230*  BUN 28* 29*  CREATININE 1.67* 1.87*  CALCIUM 8.8* 8.4*   PT/INR Recent Labs    11/02/22 0816  LABPROT 13.5  INR 1.0   ABG No results for input(s): "PHART", "HCO3" in the last 72 hours.  Invalid input(s): "PCO2", "PO2"  Studies/Results: US Abdomen Limited RUQ (LIVER/GB)  Result Date: 11/03/2022 CLINICAL DATA:  Cholelithiasis. EXAM: ULTRASOUND ABDOMEN LIMITED RIGHT UPPER QUADRANT COMPARISON:  None Available. FINDINGS: Gallbladder: Echogenic gallstones and heterogeneous gallbladder sludge are seen within the gallbladder lumen. The gallbladder wall measures 4.6 mm in thickness. A positive sonographic Eulah Pont sign is noted by the sonographer. Common bile duct: Diameter: 5.1 mm Liver: No focal lesion identified. Diffusely increased echogenicity of the liver parenchyma is noted. Portal vein is patent on color Doppler imaging with normal direction of blood flow towards the liver. Other: Limited study secondary to the patient's body habitus. IMPRESSION: Cholelithiasis and  gallbladder sludge, in the setting of a positive sonographic Murphy sign, consistent with acute cholecystitis. Electronically Signed   By: Aram Candela M.D.   On: 11/03/2022 20:18   ECHOCARDIOGRAM COMPLETE  Result Date: 11/03/2022    ECHOCARDIOGRAM REPORT   Patient Name:   Steven Mcclure Date of Exam: 11/03/2022 Medical Rec #:  [id number]     Height:       71.0 in Accession #:    [id number]    Weight:       259.0 lb Date of Birth:  02/28/1951     BSA:          2.353 m Patient Age:    72 years      BP:           135/62 mmHg Patient Gender: M             HR:           115 bpm. Exam Location:  Inpatient Procedure: 2D Echo, Cardiac Doppler, Color Doppler and Intracardiac            Opacification Agent Indications:    Chest Pain R07.9  History:        Patient has no prior history of Echocardiogram examinations. CKD                 3, Signs/Symptoms:Chest Pain; Risk Factors:Hypertension,                 Dyslipidemia, Former Smoker and Diabetes.  Sonographer:    Dondra Prader RVT RCS Referring Phys: [id number] Cecille Po  MELVIN  Sonographer Comments: Technically challenging study due to limited acoustic windows, Technically difficult study due to poor echo windows, suboptimal parasternal window, suboptimal apical window, suboptimal subcostal window and patient is obese. Image acquisition challenging due to patient body habitus. IMPRESSIONS  1. Left ventricular ejection fraction, by estimation, is 65 to 70%. The left ventricle has normal function. The left ventricle has no regional wall motion abnormalities. There is mild left ventricular hypertrophy. Indeterminate diastolic filling due to E-A fusion.  2. Right ventricular systolic function is normal. The right ventricular size is normal. Tricuspid regurgitation signal is inadequate for assessing PA pressure.  3. The mitral valve is normal in structure. Trivial mitral valve regurgitation. No evidence of mitral stenosis.  4. The aortic valve is grossly normal. There is  mild calcification of the aortic valve. Aortic valve regurgitation is not visualized. No aortic stenosis is present.  5. The inferior vena cava is normal in size with greater than 50% respiratory variability, suggesting right atrial pressure of 3 mmHg. FINDINGS  Left Ventricle: Left ventricular ejection fraction, by estimation, is 65 to 70%. The left ventricle has normal function. The left ventricle has no regional wall motion abnormalities. Definity contrast agent was given IV to delineate the left ventricular  endocardial borders. The left ventricular internal cavity size was normal in size. There is mild left ventricular hypertrophy. Indeterminate diastolic filling due to E-A fusion. Right Ventricle: The right ventricular size is normal. No increase in right ventricular wall thickness. Right ventricular systolic function is normal. Tricuspid regurgitation signal is inadequate for assessing PA pressure. Left Atrium: Left atrial size was normal in size. Right Atrium: Right atrial size was normal in size. Pericardium: There is no evidence of pericardial effusion. Mitral Valve: The mitral valve is normal in structure. Trivial mitral valve regurgitation. No evidence of mitral valve stenosis. Tricuspid Valve: The tricuspid valve is normal in structure. Tricuspid valve regurgitation is trivial. No evidence of tricuspid stenosis. Aortic Valve: The aortic valve is grossly normal. There is mild calcification of the aortic valve. Aortic valve regurgitation is not visualized. No aortic stenosis is present. Aortic valve mean gradient measures 3.0 mmHg. Aortic valve peak gradient measures 5.0 mmHg. Aortic valve area, by VTI measures 4.25 cm. Pulmonic Valve: The pulmonic valve was normal in structure. Pulmonic valve regurgitation is not visualized. No evidence of pulmonic stenosis. Aorta: The aortic root is normal in size and structure. Venous: The inferior vena cava is normal in size with greater than 50% respiratory  variability, suggesting right atrial pressure of 3 mmHg. IAS/Shunts: The atrial septum is grossly normal.  LEFT VENTRICLE PLAX 2D LVIDd:         4.60 cm LVIDs:         2.90 cm LV PW:         1.20 cm LV IVS:        1.10 cm LVOT diam:     2.20 cm LV SV:         68 LV SV Index:   29 LVOT Area:     3.80 cm  RIGHT VENTRICLE             IVC RV Basal diam:  4.40 cm     IVC diam: 1.40 cm RV Mid diam:    4.30 cm RV S prime:     16.60 cm/s TAPSE (M-mode): 2.1 cm LEFT ATRIUM             Index        RIGHT  ATRIUM           Index LA diam:        3.80 cm 1.61 cm/m   RA Area:     14.80 cm LA Vol (A2C):   29.9 ml 12.70 ml/m  RA Volume:   36.30 ml  15.42 ml/m LA Vol (A4C):   36.2 ml 15.38 ml/m LA Biplane Vol: 33.5 ml 14.23 ml/m  AORTIC VALVE                    PULMONIC VALVE AV Area (Vmax):    4.45 cm     PV Vmax:       1.03 m/s AV Area (Vmean):   4.25 cm     PV Peak grad:  4.2 mmHg AV Area (VTI):     4.25 cm AV Vmax:           112.00 cm/s AV Vmean:          73.800 cm/s AV VTI:            0.160 m AV Peak Grad:      5.0 mmHg AV Mean Grad:      3.0 mmHg LVOT Vmax:         131.00 cm/s LVOT Vmean:        82.500 cm/s LVOT VTI:          0.179 m LVOT/AV VTI ratio: 1.12  AORTA Ao Root diam: 3.40 cm Ao Asc diam:  3.60 cm MITRAL VALVE MV Area (PHT): 5.16 cm     SHUNTS MV Decel Time: 147 msec     Systemic VTI:  0.18 m MV E velocity: 143.00 cm/s  Systemic Diam: 2.20 cm Weston Brass MD Electronically signed by Weston Brass MD Signature Date/Time: 11/03/2022/9:58:47 AM    Final    CT Angio Chest/Abd/Pel for Dissection W and/or Wo Contrast  Result Date: 11/02/2022 CLINICAL DATA:  Central chest pain and diaphoresis. EXAM: CT ANGIOGRAPHY CHEST, ABDOMEN AND PELVIS TECHNIQUE: Non-contrast CT of the chest was initially obtained. Multidetector CT imaging through the chest, abdomen and pelvis was performed using the standard protocol during bolus administration of intravenous contrast. Multiplanar reconstructed images and MIPs were  obtained and reviewed to evaluate the vascular anatomy. RADIATION DOSE REDUCTION: This exam was performed according to the departmental dose-optimization program which includes automated exposure control, adjustment of the mA and/or kV according to patient size and/or use of iterative reconstruction technique. CONTRAST:  OMNIPAQUE IOHEXOL 350 MG/ML SOLN COMPARISON:  Coronary calcium score study on 10/12/2020, CT of the abdomen and pelvis without contrast on 07/10/2010, CTA of the chest on 12/21/2009 FINDINGS: CTA CHEST FINDINGS Cardiovascular: Atherosclerosis of the thoracic aorta without evidence of aneurysm or dissection. Visualized proximal great vessels demonstrate normal patency and branching anatomy. The heart size is within normal limits. Extensive calcified coronary artery plaque again noted in a 3 vessel distribution. No pericardial fluid identified. Central pulmonary arteries are normal in caliber. Mediastinum/Nodes: No enlarged mediastinal, hilar, or axillary lymph nodes. Thyroid gland, trachea, and esophagus demonstrate no significant findings. Lungs/Pleura: There is no evidence of pulmonary edema, consolidation, pneumothorax, nodule or pleural fluid. Musculoskeletal: No chest wall abnormality. No acute or significant osseous findings. Review of the MIP images confirms the above findings. CTA ABDOMEN AND PELVIS FINDINGS VASCULAR Aorta: Atherosclerosis of the abdominal aorta without evidence of aneurysm or dissection. Celiac: Normally patent. Normally patent branch vessels and branch vessel anatomy. SMA: Normally patent. Renals: Single right and paired left renal arteries  demonstrate normal patency. IMA: Normally patent. Inflow: Atherosclerosis of bilateral proximal common iliac arteries without stenosis. Bilateral iliac arteries demonstrate no aneurysmal disease or significant obstructive disease. Bilateral common femoral arteries and femoral bifurcations demonstrate normal patency. Review of the  MIP images confirms the above findings. NON-VASCULAR Hepatobiliary: Liver demonstrates evidence of diffuse steatosis. No overt cirrhotic morphology. Multiple gallstones within the gallbladder. No overt gallbladder inflammation or evidence of biliary ductal dilatation. Pancreas: Unremarkable. No pancreatic ductal dilatation or surrounding inflammatory changes. Spleen: Normal in size without focal abnormality. Adrenals/Urinary Tract: Normal adrenal glands. Cluster of nonobstructing calculi in the lower pole of the right kidney measuring up to approximately 10 mm. No ureteral or bladder abnormalities. Stomach/Bowel: No oral contrast administered. Bowel shows no evidence of obstruction, ileus, inflammation or lesion. The appendix is normal. No free intraperitoneal air. Diffuse diverticulosis of the lower descending and entire sigmoid colon without evidence of diverticulitis. The entire colon is very redundant. Lymphatic: No lymphadenopathy identified in the abdomen or pelvis. Reproductive: Prostate is unremarkable. Other: No abdominal wall hernia or abnormality. No abdominopelvic ascites. Musculoskeletal: No acute or significant osseous findings. Review of the MIP images confirms the above findings. IMPRESSION: 1. No acute findings in the chest, abdomen or pelvis. 2. Atherosclerosis of the thoracic and abdominal aorta without evidence of aneurysm or dissection. 3. Significant coronary atherosclerosis with calcified coronary artery plaque in a 3 vessel distribution. This has been documented previously by coronary calcium score study in 2022. 4. Hepatic steatosis. 5. Cholelithiasis without evidence of cholecystitis or biliary obstruction. 6. Cluster of nonobstructing calculi in the lower pole of the right kidney measuring up to 10 mm. 7. Diverticulosis of the lower descending and entire sigmoid colon without evidence of diverticulitis. Aortic Atherosclerosis (ICD10-I70.0). Electronically Signed   By: Irish Lack M.D.    On: 11/02/2022 10:30   DG Chest Portable 1 View  Result Date: 11/02/2022 CLINICAL DATA:  Chest pain EXAM: PORTABLE CHEST 1 VIEW COMPARISON:  03/11/2022 FINDINGS: Artifact from EKG leads. Normal heart size and mediastinal contours. No acute infiltrate or edema. No effusion or pneumothorax. No acute osseous findings. IMPRESSION: No evidence of active disease. Electronically Signed   By: Tiburcio Pea M.D.   On: 11/02/2022 08:40    Anti-infectives: Anti-infectives (From admission, onward)    Start     Dose/Rate Route Frequency Ordered Stop   11/03/22 1415  piperacillin-tazobactam (ZOSYN) IVPB 3.375 g        3.375 g 12.5 mL/hr over 240 Minutes Intravenous Every 8 hours 11/03/22 1326         Assessment/Plan: Acute calculous cholecystitis. Worsening AKI, marked leukocytosis  I recommend proceeding with laparoscopic cholecystectomy with ICG dye, possible cholangiogram. Discussed relevant anatomy, technique and risks of surgery including bleeding, pain, scarring, intraabdominal injury specifically to the common bile duct and sequelae, bile leak, conversion to open surgery, subtotal cholecystectomy, blood clot, pneumonia, heart attack, stroke, failure to resolve symptoms, etc. Questions welcomed and answered. Will plan to proceed to OR today.    LOS: 1 day    Berna Bue 11/04/2022  Mdm- high

## 2022-11-05 ENCOUNTER — Encounter (HOSPITAL_COMMUNITY): Payer: Self-pay | Admitting: Surgery

## 2022-11-05 ENCOUNTER — Other Ambulatory Visit: Payer: Self-pay

## 2022-11-05 DIAGNOSIS — R079 Chest pain, unspecified: Secondary | ICD-10-CM | POA: Diagnosis not present

## 2022-11-05 LAB — CBC
HCT: 34.2 % — ABNORMAL LOW (ref 39.0–52.0)
Hemoglobin: 11.4 g/dL — ABNORMAL LOW (ref 13.0–17.0)
MCH: 29.4 pg (ref 26.0–34.0)
MCHC: 33.3 g/dL (ref 30.0–36.0)
MCV: 88.1 fL (ref 80.0–100.0)
Platelets: 141 10*3/uL — ABNORMAL LOW (ref 150–400)
RBC: 3.88 MIL/uL — ABNORMAL LOW (ref 4.22–5.81)
RDW: 14.4 % (ref 11.5–15.5)
WBC: 22.5 10*3/uL — ABNORMAL HIGH (ref 4.0–10.5)
nRBC: 0 % (ref 0.0–0.2)

## 2022-11-05 LAB — GLUCOSE, CAPILLARY
Glucose-Capillary: 279 mg/dL — ABNORMAL HIGH (ref 70–99)
Glucose-Capillary: 297 mg/dL — ABNORMAL HIGH (ref 70–99)
Glucose-Capillary: 352 mg/dL — ABNORMAL HIGH (ref 70–99)
Glucose-Capillary: 439 mg/dL — ABNORMAL HIGH (ref 70–99)

## 2022-11-05 LAB — COMPREHENSIVE METABOLIC PANEL
ALT: 25 U/L (ref 0–44)
AST: 27 U/L (ref 15–41)
Albumin: 2.4 g/dL — ABNORMAL LOW (ref 3.5–5.0)
Alkaline Phosphatase: 52 U/L (ref 38–126)
Anion gap: 8 (ref 5–15)
BUN: 28 mg/dL — ABNORMAL HIGH (ref 8–23)
CO2: 23 mmol/L (ref 22–32)
Calcium: 7.8 mg/dL — ABNORMAL LOW (ref 8.9–10.3)
Chloride: 101 mmol/L (ref 98–111)
Creatinine, Ser: 1.88 mg/dL — ABNORMAL HIGH (ref 0.61–1.24)
GFR, Estimated: 37 mL/min — ABNORMAL LOW (ref >60)
Glucose, Bld: 340 mg/dL — ABNORMAL HIGH (ref 70–99)
Potassium: 4.3 mmol/L (ref 3.5–5.1)
Sodium: 132 mmol/L — ABNORMAL LOW (ref 135–145)
Total Bilirubin: 0.7 mg/dL (ref 0.3–1.2)
Total Protein: 5.8 g/dL — ABNORMAL LOW (ref 6.5–8.1)

## 2022-11-05 LAB — SURGICAL PATHOLOGY

## 2022-11-05 LAB — MAGNESIUM: Magnesium: 1.9 mg/dL (ref 1.7–2.4)

## 2022-11-05 MED ORDER — INSULIN ASPART 100 UNIT/ML IJ SOLN
10.0000 [IU] | Freq: Once | INTRAMUSCULAR | Status: AC
  Administered 2022-11-05: 10 [IU] via INTRAVENOUS

## 2022-11-05 MED ORDER — IPRATROPIUM-ALBUTEROL 0.5-2.5 (3) MG/3ML IN SOLN: 3.0000 mL | Freq: Four times a day (QID) | RESPIRATORY_TRACT | Status: DC | PRN

## 2022-11-05 MED ORDER — OXYCODONE HCL 5 MG PO TABS: 5.0000 mg | ORAL_TABLET | Freq: Four times a day (QID) | ORAL | 0 refills | Status: DC | PRN

## 2022-11-05 MED ORDER — AMOXICILLIN-POT CLAVULANATE 875-125 MG PO TABS: 1.0000 | ORAL_TABLET | Freq: Two times a day (BID) | ORAL | 0 refills | Status: AC

## 2022-11-05 MED ORDER — ACETAMINOPHEN 325 MG PO TABS: 650.0000 mg | ORAL_TABLET | Freq: Four times a day (QID) | ORAL | Status: AC | PRN

## 2022-11-05 NOTE — Discharge Summary (Signed)
Physician Discharge Summary  Kayce Shangraw ZOX:[id number] DOB: 03-31-1951 DOA: 11/02/2022  PCP: [name], MD  Admit date: 11/02/2022 Discharge date: 11/05/2022  Admitted From: Home Disposition:  Home  Discharge Condition:Stable CODE STATUS:FULL Diet recommendation: Carb Modified  Brief/Interim Summary: 72 year old gentleman with history of hypertension, hyperlipidemia, type 2 diabetes, CKD stage IIIb, gout, mild obstructive coronary artery disease presented to the emergency room early morning with sudden onset of chest pain, epigastric discomfort along with the diaphoresis.  Took Tylenol and Tums without relief.  Some relief with GI cocktail in the ER.  With cardiovascular risk factors he was admitted to the hospital.  In the emergency room tachycardic, CBC 12.5.  Troponins negative.  CT scan abdomen pelvis with hepatic steatosis, nonobstructive right renal calculi and gallbladder stones. Admitted with suspected acute coronary syndrome, found to have acute calculus cholecystitis.  General surgery consulted.  Underwent laparoscopic cholecystectomy with finding of severe gangrenous changes.  General surgery cleared for discharge.  He will be discharged on oral antibiotics for few more days.  He will follow-up with general surgery as an outpatient.   Following problems were addressed during the hospitalization:  Acute calculus cholecystitis: Presented with epigastric and right upper quadrant pain. SIRS criteria met due to tachycardia on admission heart rate more than 100, progressive leukocytosis with white cell count 32,000.  Clinical evidence and positive Murphy sign on clinical exam. Cholelithiasis on CT scan that was done as angiogram, LFTs normal. General surgery consulted.  Status post laparoscopy cholecystectomy.  Leukocytosis improving.  He remains on provide.  Continue antibiotics for few more days.  Follow-up with general surgery as an outpatient   Chest pain: Secondary to  gallbladder problem.  Acute coronary syndrome ruled out.  Nonischemic EKG.  Nonischemic troponins and normal echocardiogram.  Will continue aspirin and beta-blockers.  Will recommend to follow-up with cardiology as an outpatient.  His echocardiogram is reassuring.   Acute kidney injury on chronic kidney disease stage IIIb: Baseline creatinine about 1.3. Creatinine remains in the range of 1.8.  Lisinopril held.  He is normotensive without any blood pressure medications.  Check BMP in a week   Hypomagnesemia: Magnesium replaced.   Essential hypertension : blood pressure stable on lisinopril.  Held lisinopril due to AKI.  Hyperlipidemia: on statin  Type 2 diabetes, on insulin from home.  Continue.  Gout: on allopurinol  Wheezing: Found to be wheezing today.  Patient states he takes albuterol at home and wheezes intermittently.  He denies any further workup on this and wants to go home today.  I offered him a dose of DuoNeb before he goes home.  He is saturating fine on room air  Discharge Diagnoses:  Principal Problem:   Chest pain, rule out acute myocardial infarction Active Problems:   Diabetes (HCC)   High blood pressure   Atherosclerosis of coronary artery   Obesity   Chronic kidney disease, stage 3b (HCC)   Gout   Hyperlipidemia   Polyneuropathy due to type 2 diabetes mellitus (HCC)   Chest pain   Cholelithiasis    Discharge Instructions  Discharge Instructions     Ambulatory referral to Cardiology   Complete by: As directed    If you have not heard from the Cardiology office within the next 72 hours please call [phone].   Diet Carb Modified   Complete by: As directed    Discharge instructions   Complete by: As directed    1)Please take prescribed medications as instructed 2)Follow up with general  surgery on 11/25/2022.  Name and number the provider has been attached 3)Follow up with your PCP in a week and do CBC and BMP test during the follow-up   Increase  activity slowly   Complete by: As directed       Allergies as of 11/05/2022       Reactions   Crestor [rosuvastatin] Other (See Comments)   Myalgias Arthralgia Pt currently taking 20mg  QD.   Lipitor [atorvastatin] Other (See Comments)   Myalgias Arthralgia   Hydrocod Poli-chlorphe Poli Er Rash   Tussin Cough [dextromethorphan Hbr] Rash        Medication List     STOP taking these medications    lisinopril 20 MG tablet Commonly known as: ZESTRIL       TAKE these medications    acetaminophen 325 MG tablet Commonly known as: TYLENOL Take 2 tablets (650 mg total) by mouth every 6 (six) hours as needed for headache or mild pain. What changed:  medication strength how much to take reasons to take this   albuterol 108 (90 Base) MCG/ACT inhaler Commonly known as: VENTOLIN HFA Inhale 1-2 puffs into the lungs every 6 (six) hours as needed for shortness of breath or wheezing.   allopurinol 100 MG tablet Commonly known as: ZYLOPRIM TAKE 1 TABLET BY MOUTH TWICE A DAY What changed:  how much to take when to take this   amoxicillin-clavulanate 875-125 MG tablet Commonly known as: AUGMENTIN Take 1 tablet by mouth 2 (two) times daily for 5 days.   aspirin EC 81 MG tablet Take 81 mg by mouth daily.   ezetimibe 10 MG tablet Commonly known as: ZETIA Take 10 mg by mouth every other day.   Fish Oil 1200 MG Caps Take 1,200 mg by mouth daily.   HUMIRA PEN [place] Inject 1 Dose into the skin every 14 (fourteen) days.   Lantus 100 UNIT/ML injection Generic drug: insulin glargine Inject 90 Units into the skin daily.   leflunomide 20 MG tablet Commonly known as: ARAVA Take 20 mg by mouth daily.   loratadine 10 MG tablet Commonly known as: CLARITIN Take 10 mg by mouth daily.   metFORMIN 1000 MG tablet Commonly known as: GLUCOPHAGE Take 1 tablet (1,000 mg total) by mouth daily.   multivitamin with minerals Tabs tablet Take 1 tablet by mouth daily.   oxyCODONE 5 MG  immediate release tablet Commonly known as: Oxy IR/ROXICODONE Take 1 tablet (5 mg total) by mouth every 6 (six) hours as needed for severe pain or breakthrough pain.   pantoprazole 40 MG tablet Commonly known as: Protonix Take 1 tablet (40 mg total) by mouth daily.   rosuvastatin 20 MG tablet Commonly known as: CRESTOR Take 1 tablet (20 mg total) by mouth daily.   sucralfate 1 GM/10ML suspension Commonly known as: Carafate Take 10 mLs (1 g total) by mouth with breakfast, with lunch, and with evening meal.   Trulicity 3 MG/0.5ML Sopn Generic drug: Dulaglutide Inject 3 mg as directed every Friday.   VITAMIN B-12 PO Take 1 tablet by mouth daily.        Follow-up Information     Gastroenterology, Deboraha Sprang. Schedule an appointment as soon as possible for a visit .   Contact information: [address] [place] [address] [phone]          [facility]. Schedule an appointment as soon as possible for a visit .   Contact information:  [address] [place] [address] [phone]        [facility], Hedda Slade, [place]. Go on 11/25/2022.   Specialty: General Surgery Why: 9:45 AM. Please arrive 30 min prior to appointment time to check in. Contact information: [address] [facility] [place] [address] [phone]                Allergies  Allergen Reactions   Crestor [Rosuvastatin] Other (See Comments)    Myalgias Arthralgia  Pt currently taking 20mg  QD.   Lipitor [Atorvastatin] Other (See Comments)    Myalgias Arthralgia   Hydrocod Poli-Chlorphe Poli Er Rash   Tussin Cough [Dextromethorphan Hbr] Rash    Consultations: Surgery,cardiology   Procedures/Studies: US Abdomen Limited RUQ (LIVER/GB)  Result Date: 11/03/2022 CLINICAL DATA:  Cholelithiasis. EXAM: ULTRASOUND ABDOMEN LIMITED RIGHT UPPER QUADRANT COMPARISON:  None  Available. FINDINGS: Gallbladder: Echogenic gallstones and heterogeneous gallbladder sludge are seen within the gallbladder lumen. The gallbladder wall measures 4.6 mm in thickness. A positive sonographic Eulah Pont sign is noted by the sonographer. Common bile duct: Diameter: 5.1 mm Liver: No focal lesion identified. Diffusely increased echogenicity of the liver parenchyma is noted. Portal vein is patent on color Doppler imaging with normal direction of blood flow towards the liver. Other: Limited study secondary to the patient's body habitus. IMPRESSION: Cholelithiasis and gallbladder sludge, in the setting of a positive sonographic Murphy sign, consistent with acute cholecystitis. Electronically Signed   By: Aram Candela M.D.   On: 11/03/2022 20:18   ECHOCARDIOGRAM COMPLETE  Result Date: 11/03/2022    ECHOCARDIOGRAM REPORT   Patient Name:   Steven Mcclure Date of Exam: 11/03/2022 Medical Rec #:  [id number]     Height:       71.0 in Accession #:    [id number]    Weight:       259.0 lb Date of Birth:  [birth year]     BSA:          2.353 m Patient Age:    71 years      BP:           135/62 mmHg Patient Gender: M             HR:           115 bpm. Exam Location:  Inpatient Procedure: 2D Echo, Cardiac Doppler, Color Doppler and Intracardiac            Opacification Agent Indications:    Chest Pain R07.9  History:        Patient has no prior history of Echocardiogram examinations. CKD                 3, Signs/Symptoms:Chest Pain; Risk Factors:Hypertension,                 Dyslipidemia, Former Smoker and Diabetes.  Sonographer:    Dondra Prader RVT RCS Referring Phys: [id number] Cecille Po MELVIN  Sonographer Comments: Technically challenging study due to limited acoustic windows, Technically difficult study due to poor echo windows, suboptimal parasternal window, suboptimal apical window, suboptimal subcostal window and patient is obese. Image acquisition challenging due to patient body habitus. IMPRESSIONS  1. Left  ventricular ejection fraction, by estimation, is 65 to 70%. The left ventricle has normal function. The left ventricle has no regional wall motion abnormalities. There is mild left ventricular hypertrophy. Indeterminate diastolic filling due to E-A fusion.  2. Right ventricular systolic function is normal. The right ventricular size  is normal. Tricuspid regurgitation signal is inadequate for assessing PA pressure.  3. The mitral valve is normal in structure. Trivial mitral valve regurgitation. No evidence of mitral stenosis.  4. The aortic valve is grossly normal. There is mild calcification of the aortic valve. Aortic valve regurgitation is not visualized. No aortic stenosis is present.  5. The inferior vena cava is normal in size with greater than 50% respiratory variability, suggesting right atrial pressure of 3 mmHg. FINDINGS  Left Ventricle: Left ventricular ejection fraction, by estimation, is 65 to 70%. The left ventricle has normal function. The left ventricle has no regional wall motion abnormalities. Definity contrast agent was given IV to delineate the left ventricular  endocardial borders. The left ventricular internal cavity size was normal in size. There is mild left ventricular hypertrophy. Indeterminate diastolic filling due to E-A fusion. Right Ventricle: The right ventricular size is normal. No increase in right ventricular wall thickness. Right ventricular systolic function is normal. Tricuspid regurgitation signal is inadequate for assessing PA pressure. Left Atrium: Left atrial size was normal in size. Right Atrium: Right atrial size was normal in size. Pericardium: There is no evidence of pericardial effusion. Mitral Valve: The mitral valve is normal in structure. Trivial mitral valve regurgitation. No evidence of mitral valve stenosis. Tricuspid Valve: The tricuspid valve is normal in structure. Tricuspid valve regurgitation is trivial. No evidence of tricuspid stenosis. Aortic Valve: The  aortic valve is grossly normal. There is mild calcification of the aortic valve. Aortic valve regurgitation is not visualized. No aortic stenosis is present. Aortic valve mean gradient measures 3.0 mmHg. Aortic valve peak gradient measures 5.0 mmHg. Aortic valve area, by VTI measures 4.25 cm. Pulmonic Valve: The pulmonic valve was normal in structure. Pulmonic valve regurgitation is not visualized. No evidence of pulmonic stenosis. Aorta: The aortic root is normal in size and structure. Venous: The inferior vena cava is normal in size with greater than 50% respiratory variability, suggesting right atrial pressure of 3 mmHg. IAS/Shunts: The atrial septum is grossly normal.  LEFT VENTRICLE PLAX 2D LVIDd:         4.60 cm LVIDs:         2.90 cm LV PW:         1.20 cm LV IVS:        1.10 cm LVOT diam:     2.20 cm LV SV:         68 LV SV Index:   29 LVOT Area:     3.80 cm  RIGHT VENTRICLE             IVC RV Basal diam:  4.40 cm     IVC diam: 1.40 cm RV Mid diam:    4.30 cm RV S prime:     16.60 cm/s TAPSE (M-mode): 2.1 cm LEFT ATRIUM             Index        RIGHT ATRIUM           Index LA diam:        3.80 cm 1.61 cm/m   RA Area:     14.80 cm LA Vol (A2C):   29.9 ml 12.70 ml/m  RA Volume:   36.30 ml  15.42 ml/m LA Vol (A4C):   36.2 ml 15.38 ml/m LA Biplane Vol: 33.5 ml 14.23 ml/m  AORTIC VALVE                    PULMONIC VALVE AV  Area (Vmax):    4.45 cm     PV Vmax:       1.03 m/s AV Area (Vmean):   4.25 cm     PV Peak grad:  4.2 mmHg AV Area (VTI):     4.25 cm AV Vmax:           112.00 cm/s AV Vmean:          73.800 cm/s AV VTI:            0.160 m AV Peak Grad:      5.0 mmHg AV Mean Grad:      3.0 mmHg LVOT Vmax:         131.00 cm/s LVOT Vmean:        82.500 cm/s LVOT VTI:          0.179 m LVOT/AV VTI ratio: 1.12  AORTA Ao Root diam: 3.40 cm Ao Asc diam:  3.60 cm MITRAL VALVE MV Area (PHT): 5.16 cm     SHUNTS MV Decel Time: 147 msec     Systemic VTI:  0.18 m MV E velocity: 143.00 cm/s  Systemic Diam: 2.20 cm  Weston Brass MD Electronically signed by Weston Brass MD Signature Date/Time: 11/03/2022/9:58:47 AM    Final    CT Angio Chest/Abd/Pel for Dissection W and/or Wo Contrast  Result Date: 11/02/2022 CLINICAL DATA:  Central chest pain and diaphoresis. EXAM: CT ANGIOGRAPHY CHEST, ABDOMEN AND PELVIS TECHNIQUE: Non-contrast CT of the chest was initially obtained. Multidetector CT imaging through the chest, abdomen and pelvis was performed using the standard protocol during bolus administration of intravenous contrast. Multiplanar reconstructed images and MIPs were obtained and reviewed to evaluate the vascular anatomy. RADIATION DOSE REDUCTION: This exam was performed according to the departmental dose-optimization program which includes automated exposure control, adjustment of the mA and/or kV according to patient size and/or use of iterative reconstruction technique. CONTRAST:  OMNIPAQUE IOHEXOL 350 MG/ML SOLN COMPARISON:  Coronary calcium score study on 10/12/2020, CT of the abdomen and pelvis without contrast on 07/10/2010, CTA of the chest on 12/21/2009 FINDINGS: CTA CHEST FINDINGS Cardiovascular: Atherosclerosis of the thoracic aorta without evidence of aneurysm or dissection. Visualized proximal great vessels demonstrate normal patency and branching anatomy. The heart size is within normal limits. Extensive calcified coronary artery plaque again noted in a 3 vessel distribution. No pericardial fluid identified. Central pulmonary arteries are normal in caliber. Mediastinum/Nodes: No enlarged mediastinal, hilar, or axillary lymph nodes. Thyroid gland, trachea, and esophagus demonstrate no significant findings. Lungs/Pleura: There is no evidence of pulmonary edema, consolidation, pneumothorax, nodule or pleural fluid. Musculoskeletal: No chest wall abnormality. No acute or significant osseous findings. Review of the MIP images confirms the above findings. CTA ABDOMEN AND PELVIS FINDINGS VASCULAR Aorta:  Atherosclerosis of the abdominal aorta without evidence of aneurysm or dissection. Celiac: Normally patent. Normally patent branch vessels and branch vessel anatomy. SMA: Normally patent. Renals: Single right and paired left renal arteries demonstrate normal patency. IMA: Normally patent. Inflow: Atherosclerosis of bilateral proximal common iliac arteries without stenosis. Bilateral iliac arteries demonstrate no aneurysmal disease or significant obstructive disease. Bilateral common femoral arteries and femoral bifurcations demonstrate normal patency. Review of the MIP images confirms the above findings. NON-VASCULAR Hepatobiliary: Liver demonstrates evidence of diffuse steatosis. No overt cirrhotic morphology. Multiple gallstones within the gallbladder. No overt gallbladder inflammation or evidence of biliary ductal dilatation. Pancreas: Unremarkable. No pancreatic ductal dilatation or surrounding inflammatory changes. Spleen: Normal in size without focal abnormality. Adrenals/Urinary Tract: Normal adrenal  glands. Cluster of nonobstructing calculi in the lower pole of the right kidney measuring up to approximately 10 mm. No ureteral or bladder abnormalities. Stomach/Bowel: No oral contrast administered. Bowel shows no evidence of obstruction, ileus, inflammation or lesion. The appendix is normal. No free intraperitoneal air. Diffuse diverticulosis of the lower descending and entire sigmoid colon without evidence of diverticulitis. The entire colon is very redundant. Lymphatic: No lymphadenopathy identified in the abdomen or pelvis. Reproductive: Prostate is unremarkable. Other: No abdominal wall hernia or abnormality. No abdominopelvic ascites. Musculoskeletal: No acute or significant osseous findings. Review of the MIP images confirms the above findings. IMPRESSION: 1. No acute findings in the chest, abdomen or pelvis. 2. Atherosclerosis of the thoracic and abdominal aorta without evidence of aneurysm or  dissection. 3. Significant coronary atherosclerosis with calcified coronary artery plaque in a 3 vessel distribution. This has been documented previously by coronary calcium score study in 2022. 4. Hepatic steatosis. 5. Cholelithiasis without evidence of cholecystitis or biliary obstruction. 6. Cluster of nonobstructing calculi in the lower pole of the right kidney measuring up to 10 mm. 7. Diverticulosis of the lower descending and entire sigmoid colon without evidence of diverticulitis. Aortic Atherosclerosis (ICD10-I70.0). Electronically Signed   By: Irish Lack M.D.   On: 11/02/2022 10:30   DG Chest Portable 1 View  Result Date: 11/02/2022 CLINICAL DATA:  Chest pain EXAM: PORTABLE CHEST 1 VIEW COMPARISON:  03/11/2022 FINDINGS: Artifact from EKG leads. Normal heart size and mediastinal contours. No acute infiltrate or edema. No effusion or pneumothorax. No acute osseous findings. IMPRESSION: No evidence of active disease. Electronically Signed   By: Tiburcio Pea M.D.   On: 11/02/2022 08:40      Subjective: Patient seen and examined the bedside today.  Hemodynamically stable.  On room air. Denied worsening abdominal pain, nausea or vomiting.  Will eager to go home today.  Son at the bedside.  Found to be wheezing but saturating fine on room air.  We discussed about her nebulizer treatment before going home.  Long discussion held with the son at the bedside about discharge planning  Discharge Exam: Vitals:   11/05/22 0517 11/05/22 0813  BP: 139/69 136/74  Pulse: 80 89  Resp: (!) 21 (!) 22  Temp: 98.4 F (36.9 C) 99 F (37.2 C)  SpO2: 96% 98%   Vitals:   11/04/22 1626 11/04/22 2013 11/05/22 0517 11/05/22 0813  BP: 133/65 (!) 143/67 139/69 136/74  Pulse: 96 100 80 89  Resp: (!) 24 (!) 24 (!) 21 (!) 22  Temp: 98.6 F (37 C) 97.9 F (36.6 C) 98.4 F (36.9 C) 99 F (37.2 C)  TempSrc: Oral Oral Oral Oral  SpO2: 95% 92% 96% 98%  Weight:      Height:        General: Pt is alert,  awake, not in acute distress,obese Cardiovascular: RRR, S1/S2 +, no rubs, no gallops, mild bilateral expiratory wheezing Respiratory: CTA bilaterally, no wheezing, no rhonchi Abdominal: Soft, NT, ND, bowel sounds +, cholecystecstomy surgical wound Extremities: no edema, no cyanosis    The results of significant diagnostics from this hospitalization (including imaging, microbiology, ancillary and laboratory) are listed below for reference.     Microbiology: Recent Results (from the past 240 hour(s))  Culture, blood (Routine X 2) w Reflex to ID Panel     Status: None (Preliminary result)   Collection Time: 11/03/22  1:28 PM   Specimen: BLOOD RIGHT ARM  Result Value Ref Range Status   Specimen Description  BLOOD RIGHT ARM  Final   Special Requests   Final    BOTTLES DRAWN AEROBIC AND ANAEROBIC Blood Culture adequate volume   Culture   Final    NO GROWTH 2 DAYS Performed at [facility], [address] [place], [place] [address]    Report Status PENDING  Incomplete  Culture, blood (Routine X 2) w Reflex to ID Panel     Status: None (Preliminary result)   Collection Time: 11/03/22  1:29 PM   Specimen: BLOOD LEFT HAND  Result Value Ref Range Status   Specimen Description BLOOD LEFT HAND  Final   Special Requests   Final    BOTTLES DRAWN AEROBIC AND ANAEROBIC Blood Culture adequate volume   Culture   Final    NO GROWTH 2 DAYS Performed at [facility], [address] [place], [place] [address]    Report Status PENDING  Incomplete  Surgical pcr screen     Status: None   Collection Time: 11/04/22  8:02 AM   Specimen: Nasal Mucosa; Nasal Swab  Result Value Ref Range Status   MRSA, PCR NEGATIVE NEGATIVE Final   Staphylococcus aureus NEGATIVE NEGATIVE Final    Comment: (NOTE) The Xpert SA Assay (FDA approved for NASAL specimens in patients 75 years of age and older), is one component of a comprehensive surveillance program. It is not intended to diagnose infection nor  to guide or monitor treatment. Performed at [facility], [address], [place], [place] [address]      Labs: BNP (last 3 results) No results for input(s): "BNP" in the last 8760 hours. Basic Metabolic Panel: Recent Labs  Lab 11/02/22 0816 11/03/22 0219 11/04/22 0120 11/05/22 0143  NA 134* 133* 132* 132*  K 4.4 4.5 4.0 4.3  CL 99 97* 98 101  CO2 25 26 26 23   GLUCOSE 230* 242* 230* 340*  BUN 23 28* 29* 28*  CREATININE 1.31* 1.67* 1.87* 1.88*  CALCIUM 9.4 8.8* 8.4* 7.8*  MG 1.8  --  1.6* 1.9   Liver Function Tests: Recent Labs  Lab 11/02/22 0816 11/04/22 0120 11/05/22 0143  AST 27 28 27   ALT 23 25 25   ALKPHOS 54 55 52  BILITOT 0.7 1.5* 0.7  PROT 7.1 6.6 5.8*  ALBUMIN 3.8 3.0* 2.4*   Recent Labs  Lab 11/02/22 0816  LIPASE 34   No results for input(s): "AMMONIA" in the last 168 hours. CBC: Recent Labs  Lab 11/02/22 0816 11/03/22 0219 11/03/22 1031 11/04/22 0120 11/05/22 0143  WBC 12.5* 29.1* 32.6* 28.3* 22.5*  NEUTROABS 9.0*  --  30.3* 23.6*  --   HGB 14.8 13.4 14.4 14.0 11.4*  HCT 45.5 39.8 43.6 42.0 34.2*  MCV 87.7 86.0 86.5 88.8 88.1  PLT 209 195 208 169 141*   Cardiac Enzymes: No results for input(s): "CKTOTAL", "CKMB", "CKMBINDEX", "TROPONINI" in the last 168 hours. BNP: Invalid input(s): "POCBNP" CBG: Recent Labs  Lab 11/04/22 2128 11/05/22 0026 11/05/22 0227 11/05/22 0810 11/05/22 1146  GLUCAP 457* 439* 352* 297* 279*   D-Dimer No results for input(s): "DDIMER" in the last 72 hours. Hgb A1c No results for input(s): "HGBA1C" in the last 72 hours. Lipid Profile No results for input(s): "CHOL", "HDL", "LDLCALC", "TRIG", "CHOLHDL", "LDLDIRECT" in the last 72 hours. Thyroid function studies No results for input(s): "TSH", "T4TOTAL", "T3FREE", "THYROIDAB" in the last 72 hours.  Invalid input(s): "FREET3" Anemia work up No results for input(s): "VITAMINB12", "FOLATE", "FERRITIN", "TIBC", "IRON", "RETICCTPCT" in  the last 72  hours. Urinalysis No results found for: "COLORURINE", "APPEARANCEUR", "LABSPEC", "PHURINE", "GLUCOSEU", "HGBUR", "BILIRUBINUR", "KETONESUR", "PROTEINUR", "UROBILINOGEN", "NITRITE", "LEUKOCYTESUR" Sepsis Labs Recent Labs  Lab 11/03/22 0219 11/03/22 1031 11/04/22 0120 11/05/22 0143  WBC 29.1* 32.6* 28.3* 22.5*   Microbiology Recent Results (from the past 240 hour(s))  Culture, blood (Routine X 2) w Reflex to ID Panel     Status: None (Preliminary result)   Collection Time: 11/03/22  1:28 PM   Specimen: BLOOD RIGHT ARM  Result Value Ref Range Status   Specimen Description BLOOD RIGHT ARM  Final   Special Requests   Final    BOTTLES DRAWN AEROBIC AND ANAEROBIC Blood Culture adequate volume   Culture   Final    NO GROWTH 2 DAYS Performed at [facility], [address] [place], [place] [address]    Report Status PENDING  Incomplete  Culture, blood (Routine X 2) w Reflex to ID Panel     Status: None (Preliminary result)   Collection Time: 11/03/22  1:29 PM   Specimen: BLOOD LEFT HAND  Result Value Ref Range Status   Specimen Description BLOOD LEFT HAND  Final   Special Requests   Final    BOTTLES DRAWN AEROBIC AND ANAEROBIC Blood Culture adequate volume   Culture   Final    NO GROWTH 2 DAYS Performed at [facility], [address], [place], [place] [address]    Report Status PENDING  Incomplete  Surgical pcr screen     Status: None   Collection Time: 11/04/22  8:02 AM   Specimen: Nasal Mucosa; Nasal Swab  Result Value Ref Range Status   MRSA, PCR NEGATIVE NEGATIVE Final   Staphylococcus aureus NEGATIVE NEGATIVE Final    Comment: (NOTE) The Xpert SA Assay (FDA approved for NASAL specimens in patients 47 years of age and older), is one component of a comprehensive surveillance program. It is not intended to diagnose infection nor to guide or monitor treatment. Performed at [facility], [address] [place], [place] [address]     Please note: You  were cared for by a hospitalist during your hospital stay. Once you are discharged, your primary care physician will handle any further medical issues. Please note that NO REFILLS for any discharge medications will be authorized once you are discharged, as it is imperative that you return to your primary care physician (or establish a relationship with a primary care physician if you do not have one) for your post hospital discharge needs so that they can reassess your need for medications and monitor your lab values.    Time coordinating discharge: 40 minutes  SIGNED:   Burnadette Pop, MD  [facility] 11/05/2022, 12:07 PM Pager [phone]  If 7PM-7AM, please contact night-coverage [email or web address] Password [email or web address]

## 2022-11-05 NOTE — Progress Notes (Signed)
Progress Note  1 Day Post-Op  Subjective: Tolerating diet and passing flatus. Denies n/v. Abdomen is sore as expected but pain is not uncontrolled.   Objective: Vital signs in last 24 hours: Temp:  [97.9 F (36.6 C)-99.8 F (37.7 C)] 99 F (37.2 C) (05/29 0813) Pulse Rate:  [80-100] 89 (05/29 0813) Resp:  [18-24] 22 (05/29 0813) BP: (120-143)/(56-74) 136/74 (05/29 0813) SpO2:  [91 %-98 %] 98 % (05/29 0813) Last BM Date : 11/01/22  Intake/Output from previous day: 05/28 0701 - 05/29 0700 In: 3653.1 [P.O.:480; I.V.:2709.3; IV Piggyback:463.8] Out: 1150 [Urine:1100; Blood:50] Intake/Output this shift: Total I/O In: 240 [P.O.:240] Out: -   PE: General: pleasant, WD, obese male who is laying in bed in NAD Heart: regular, rate, and rhythm.  Lungs: Respiratory effort nonlabored Abd: soft, appropriately ttp, mild distention, incisions C/D/I, +BS Psych: A&Ox3 with an appropriate affect.    Lab Results:  Recent Labs    11/04/22 0120 11/05/22 0143  WBC 28.3* 22.5*  HGB 14.0 11.4*  HCT 42.0 34.2*  PLT 169 141*   BMET Recent Labs    11/04/22 0120 11/05/22 0143  NA 132* 132*  K 4.0 4.3  CL 98 101  CO2 26 23  GLUCOSE 230* 340*  BUN 29* 28*  CREATININE 1.87* 1.88*  CALCIUM 8.4* 7.8*   PT/INR No results for input(s): "LABPROT", "INR" in the last 72 hours. CMP     Component Value Date/Time   NA 132 (L) 11/05/2022 0143   NA 141 05/14/2021 1435   K 4.3 11/05/2022 0143   CL 101 11/05/2022 0143   CO2 23 11/05/2022 0143   GLUCOSE 340 (H) 11/05/2022 0143   BUN 28 (H) 11/05/2022 0143   BUN 24 05/14/2021 1435   CREATININE 1.88 (H) 11/05/2022 0143   CREATININE 1.61 (H) 08/02/2019 0943   CALCIUM 7.8 (L) 11/05/2022 0143   PROT 5.8 (L) 11/05/2022 0143   ALBUMIN 2.4 (L) 11/05/2022 0143   AST 27 11/05/2022 0143   ALT 25 11/05/2022 0143   ALKPHOS 52 11/05/2022 0143   BILITOT 0.7 11/05/2022 0143   GFRNONAA 37 (L) 11/05/2022 0143   GFRNONAA 43 (L) 08/02/2019 0943    GFRAA 49 (L) 08/02/2019 0943   Lipase     Component Value Date/Time   LIPASE 34 11/02/2022 0816       Studies/Results: US Abdomen Limited RUQ (LIVER/GB)  Result Date: 11/03/2022 CLINICAL DATA:  Cholelithiasis. EXAM: ULTRASOUND ABDOMEN LIMITED RIGHT UPPER QUADRANT COMPARISON:  None Available. FINDINGS: Gallbladder: Echogenic gallstones and heterogeneous gallbladder sludge are seen within the gallbladder lumen. The gallbladder wall measures 4.6 mm in thickness. A positive sonographic Eulah Pont sign is noted by the sonographer. Common bile duct: Diameter: 5.1 mm Liver: No focal lesion identified. Diffusely increased echogenicity of the liver parenchyma is noted. Portal vein is patent on color Doppler imaging with normal direction of blood flow towards the liver. Other: Limited study secondary to the patient's body habitus. IMPRESSION: Cholelithiasis and gallbladder sludge, in the setting of a positive sonographic Murphy sign, consistent with acute cholecystitis. Electronically Signed   By: Aram Candela M.D.   On: 11/03/2022 20:18    Anti-infectives: Anti-infectives (From admission, onward)    Start     Dose/Rate Route Frequency Ordered Stop   11/05/22 0000  amoxicillin-clavulanate (AUGMENTIN) 875-125 MG tablet        1 tablet Oral 2 times daily 11/05/22 1042 11/10/22 2359   11/03/22 1415  piperacillin-tazobactam (ZOSYN) IVPB 3.375 g  3.375 g 12.5 mL/hr over 240 Minutes Intravenous Every 8 hours 11/03/22 1326          Assessment/Plan  Acute cholecystitis  POD1 s/p laparoscopic cholecystectomy 11/05/22 Dr. Fredricka Bonine   - tolerating diet - incisions look good - encouraged ambulation - clear for DC from surgery standpoint when medically cleared - follow up and instructions in AVS, Rx for abx and pain meds sent   FEN:CM diet, IVF per [facility] VTE: LMWH ID: [email or web address]  LOS: 2 days      Juliet Rude, [facility] 11/05/2022, 10:43 AM Please see Amion for pager  number during day hours 7:00am-4:30pm

## 2022-11-07 LAB — CULTURE, BLOOD (ROUTINE X 2)
Culture: NO GROWTH
Special Requests: ADEQUATE
Special Requests: ADEQUATE

## 2022-11-08 LAB — CULTURE, BLOOD (ROUTINE X 2)

## 2022-11-17 ENCOUNTER — Encounter: Payer: Self-pay | Admitting: General Practice

## 2022-11-17 DIAGNOSIS — R12 Heartburn: Secondary | ICD-10-CM | POA: Diagnosis not present

## 2022-11-17 DIAGNOSIS — Z79899 Other long term (current) drug therapy: Secondary | ICD-10-CM | POA: Diagnosis not present

## 2022-11-17 DIAGNOSIS — M459 Ankylosing spondylitis of unspecified sites in spine: Secondary | ICD-10-CM | POA: Diagnosis not present

## 2022-11-17 DIAGNOSIS — E1142 Type 2 diabetes mellitus with diabetic polyneuropathy: Secondary | ICD-10-CM | POA: Diagnosis not present

## 2022-11-19 DIAGNOSIS — M109 Gout, unspecified: Secondary | ICD-10-CM | POA: Diagnosis not present

## 2022-11-19 DIAGNOSIS — E1169 Type 2 diabetes mellitus with other specified complication: Secondary | ICD-10-CM | POA: Diagnosis not present

## 2022-11-19 DIAGNOSIS — N1831 Chronic kidney disease, stage 3a: Secondary | ICD-10-CM | POA: Diagnosis not present

## 2022-11-19 DIAGNOSIS — M459 Ankylosing spondylitis of unspecified sites in spine: Secondary | ICD-10-CM | POA: Diagnosis not present

## 2022-11-19 DIAGNOSIS — M25472 Effusion, left ankle: Secondary | ICD-10-CM | POA: Diagnosis not present

## 2022-11-19 DIAGNOSIS — Z79899 Other long term (current) drug therapy: Secondary | ICD-10-CM | POA: Diagnosis not present

## 2022-11-19 DIAGNOSIS — K760 Fatty (change of) liver, not elsewhere classified: Secondary | ICD-10-CM | POA: Diagnosis not present

## 2022-11-19 DIAGNOSIS — M25572 Pain in left ankle and joints of left foot: Secondary | ICD-10-CM | POA: Diagnosis not present

## 2022-12-15 NOTE — Progress Notes (Unsigned)
[facility] [phone]   [phone]  CONSULT NOTE  REFERRING PHYSICIAN:  Dr. Chanetta Marshall  REASON FOR CONSULTATION:  Leukocytosis  HPI Steven Mcclure is a 72 y.o. male with a past history significant for obesity, hypertension, coronary artery disease, cholelithiasis status post laparoscopic cholecystectomy, CKD, gout, diabetes, obesity, and arthritis is referred to the clinic for leukocytosis.  The patient had a hospital follow-up on 11/17/2022 with his PCP.  He was hospitalized for acute calculus cholecystitis status post laparoscopic cholecystectomy in May 2024.  He had repeat lab work that day that showed elevated white blood cell count at 15.2 and elevated platelet count at 598 K.  Per chart review, the patient's white blood cell count has been elevated/fluctuating for the last several follow-up visits.  Therefore, he was referred to hematology.  The oldest records available to me are from 2011.  His WBC was 9.9-21.6 at that time. Then I do not have any labs from 2011-2020.   He has persistent elevated WBC ranging from 11.7-16 after 2020. Primarily, this was elevated monocytes. Of course, during his recent hospitalization in May 2024 for cholecystitis, his WBC peaked at 32.6.   Due to the persistent leukocytosis, the patient was referred to the clinic for further evaluation management.    Overall, the patient is feeling "pretty good" today.  The patient denies any steroid use since steroids because of hyperglycemia.  He denies any frequent infections.  He states he rarely gets sick.  He has some allergies for which he takes Claritin.  Denies any fevers.  Denies any unexplained weight loss.  He was trying to lose weight prior to his cholecystectomy but has gained some of the weight back since his surgery.  Denies any signs and symptoms of infection.  He has some mild soreness over his incision site but overall this is improving.  Denies any rashes.  Denies  any hormone use.  Denies any nausea or vomiting.  Denies any lymphadenopathy.  He states he is up-to-date on his routine health screenings.  He mentions that his PCP referred him to [facility] for consideration of EGD.  He denies any herbal supplement use.  Denies any abnormal bleeding.  He does have some easy bruising secondary to his aspirin use.  Denies any family history of malignancy, bone marrow disorders, or blood disorders.  His brothers have had some coronary artery disease with stent placement and quadruple bypass.  His mother and father both had diabetes his father passed away secondary to complications from diabetes.   Patient used to work in [employer].  He is widowed.  He has 2 children.  He quit smoking in the 1970s.  He drinks approximately 3 alcoholic beverages on Saturdays.  Denies any drug use.      HPI  Past Medical History:  Diagnosis Date   Body mass index 40.0-44.9, adult (HCC)    CKD (chronic kidney disease), stage III (HCC)    Coronary atherosclerosis due to severely calcified coronary lesion    Diabetes mellitus without complication (HCC)    ED (erectile dysfunction)    Gout    History of kidney stones    Hx of colonic polyps    Hyperlipidemia    Hypertension    Long-term insulin use in type 2 diabetes (HCC)    Low testosterone in male    Obesity    Vertigo    Vitamin B12 deficiency     Past Surgical History:  Procedure Laterality Date  CHOLECYSTECTOMY N/A 11/04/2022   Procedure: LAPAROSCOPIC CHOLECYSTECTOMY WITH ICG DYE;  Surgeon: Berna Bue, MD;  Location: [facility] OR;  Service: General;  Laterality: N/A;   KIDNEY STONE SURGERY     LEFT HEART CATH AND CORONARY ANGIOGRAPHY N/A 12/24/2020   Procedure: LEFT HEART CATH AND CORONARY ANGIOGRAPHY;  Surgeon: Corky Crafts, MD;  Location: [facility] INVASIVE CV LAB;  Service: Cardiovascular;  Laterality: N/A;    Family History  Problem Relation Age of Onset   Diabetes Mother    Diabetes Father     Social  History Social History   Tobacco Use   Smoking status: Former    Types: Cigarettes    Quit date: 1979    Years since quitting: 45.5   Smokeless tobacco: Never  Vaping Use   Vaping Use: Never used  Substance Use Topics   Alcohol use: Yes    Comment: social   Drug use: Never    Allergies  Allergen Reactions   Crestor [Rosuvastatin] Other (See Comments)    Myalgias Arthralgia  Pt currently taking 20mg  QD.   Lipitor [Atorvastatin] Other (See Comments)    Myalgias Arthralgia   Hydrocod Poli-Chlorphe Poli Er Rash   Tussin Cough [Dextromethorphan Hbr] Rash    Current Outpatient Medications  Medication Sig Dispense Refill   acetaminophen (TYLENOL) 325 MG tablet Take 2 tablets (650 mg total) by mouth every 6 (six) hours as needed for headache or mild pain.     Adalimumab (HUMIRA PEN [place]) Inject 1 Dose into the skin every 14 (fourteen) days.     albuterol (VENTOLIN HFA) 108 (90 Base) MCG/ACT inhaler Inhale 1-2 puffs into the lungs every 6 (six) hours as needed for shortness of breath or wheezing.     allopurinol (ZYLOPRIM) 100 MG tablet TAKE 1 TABLET BY MOUTH TWICE A DAY (Patient taking differently: Take 200 mg by mouth daily.) 180 tablet 3   aspirin EC 81 MG tablet Take 81 mg by mouth daily.     Cyanocobalamin (VITAMIN B-12 PO) Take 1 tablet by mouth daily.     ezetimibe (ZETIA) 10 MG tablet Take 10 mg by mouth every other day.     leflunomide (ARAVA) 20 MG tablet Take 20 mg by mouth daily.     loratadine (CLARITIN) 10 MG tablet Take 10 mg by mouth daily.     metFORMIN (GLUCOPHAGE) 1000 MG tablet Take 1 tablet (1,000 mg total) by mouth daily.  10   Multiple Vitamin (MULTIVITAMIN WITH MINERALS) TABS tablet Take 1 tablet by mouth daily.     Omega-3 Fatty Acids (FISH OIL) 1200 MG CAPS Take 1,200 mg by mouth daily.     oxyCODONE (OXY IR/ROXICODONE) 5 MG immediate release tablet Take 1 tablet (5 mg total) by mouth every 6 (six) hours as needed for severe pain or breakthrough pain. 15  tablet 0   pantoprazole (PROTONIX) 40 MG tablet Take 1 tablet (40 mg total) by mouth daily. 30 tablet 2   rosuvastatin (CRESTOR) 20 MG tablet Take 1 tablet (20 mg total) by mouth daily. 90 tablet 3   sucralfate (CARAFATE) 1 GM/10ML suspension Take 10 mLs (1 g total) by mouth with breakfast, with lunch, and with evening meal. 420 mL 0   TRULICITY 3 MG/0.5ML SOPN Inject 3 mg as directed every Friday.     LANTUS 100 UNIT/ML injection Inject 90 Units into the skin daily. (Patient not taking: Reported on 12/17/2022)     Current Facility-Administered Medications  Medication Dose Route Frequency Provider Last  Rate Last Admin   triamcinolone acetonide (KENALOG) 10 MG/ML injection 10 mg  10 mg Other Once Vivi Barrack, DPM        REVIEW OF SYSTEMS:   Review of Systems  Constitutional: Negative for appetite change, chills, fatigue, fever and unexpected weight change.  HENT: Negative for mouth sores, nosebleeds, sore throat and trouble swallowing.   Eyes: Negative for eye problems and icterus.  Respiratory: Negative for cough, hemoptysis, shortness of breath and wheezing.   Cardiovascular: Negative for chest pain and leg swelling.  Gastrointestinal: Mild and improving soreness over RUQ. Negative for abdominal pain, constipation, diarrhea, nausea and vomiting.  Genitourinary: Negative for bladder incontinence, difficulty urinating, dysuria, frequency and hematuria.   Musculoskeletal: Negative for back pain, gait problem, neck pain and neck stiffness.  Skin: Negative for itching and rash.  Neurological: Negative for dizziness, extremity weakness, gait problem, headaches, light-headedness and seizures.  Hematological: Negative for adenopathy. Does not bleed easily. Positive for easy bruising.  Psychiatric/Behavioral: Negative for confusion, depression and sleep disturbance. The patient is not nervous/anxious.     PHYSICAL EXAMINATION:  Blood pressure (!) 148/68, pulse (!) 106, temperature 98 F  (36.7 C), temperature source Oral, resp. rate 17, weight 266 lb 9.6 oz (120.9 kg), SpO2 96 %.  ECOG PERFORMANCE STATUS: 0  Physical Exam  Constitutional: Oriented to person, place, and time and well-developed, well-nourished, and in no distress.  HENT:  Head: Normocephalic and atraumatic.  Mouth/Throat: Oropharynx is clear and moist. No oropharyngeal exudate.  Eyes: Conjunctivae are normal. Right eye exhibits no discharge. Left eye exhibits no discharge. No scleral icterus.  Neck: Normal range of motion. Neck supple.  Cardiovascular: Normal rate, regular rhythm, normal heart sounds and intact distal pulses.   Pulmonary/Chest: Effort normal and breath sounds normal. No respiratory distress. No wheezes. No rales.  Abdominal: Soft. Bowel sounds are normal. Exhibits no distension and no mass. Mild and improving tenderness over RUQ incision  Musculoskeletal: Normal range of motion. Exhibits no edema.  Lymphadenopathy:    No cervical adenopathy.  Neurological: Alert and oriented to person, place, and time. Exhibits normal muscle tone. Gait normal. Coordination normal.  Skin: Skin is warm and dry. No rash noted. Not diaphoretic. No erythema. No pallor.  Psychiatric: Mood, memory and judgment normal.  Vitals reviewed.  LABORATORY DATA: Lab Results  Component Value Date   WBC 12.0 (H) 12/17/2022   HGB 13.6 12/17/2022   HCT 40.2 12/17/2022   MCV 87.2 12/17/2022   PLT 224 12/17/2022      Chemistry      Component Value Date/Time   NA 138 12/17/2022 1251   NA 141 05/14/2021 1435   K 4.5 12/17/2022 1251   CL 102 12/17/2022 1251   CO2 28 12/17/2022 1251   BUN 26 (H) 12/17/2022 1251   BUN 24 05/14/2021 1435   CREATININE 1.27 (H) 12/17/2022 1251   CREATININE 1.61 (H) 08/02/2019 0943      Component Value Date/Time   CALCIUM 9.5 12/17/2022 1251   ALKPHOS 58 12/17/2022 1251   AST 21 12/17/2022 1251   ALT 23 12/17/2022 1251   BILITOT 0.5 12/17/2022 1251       RADIOGRAPHIC  STUDIES: No results found.  ASSESSMENT: This is a very pleasant 72 year old Caucasian male referred to clinic for leukocytosis.   The patient had several lab studies today including a CBC, CMP, LDH, Jak2, and BCR/ABL testing.  I had also added iron studies and ferritin due to the mild anemia on her PCPs  labs, which has improved at this time.    The patient's CBC today shows persistent but improved elevated white blood cell count at 12.   The patient was seen with Dr. Arbutus Ped today.  Dr. Arbutus Ped discussed that we will wait for the results of BCR/ABL and JAK2 mutation testing before determining next steps; however, this could be reactive, especially last month in the setting of cholecystitis. He also has arthritis that flares up periodically.  If these are negative, then Dr. Arbutus Ped recommended labs and follow-up in 3 months for 1 more visit.  Of course, if any concerning findings are found, we would arrange for him to be seen sooner.   I told the patient I would call him regardless with the JAK2 and BCR/ABL test. He gave me permission to leave a detailed voicemail if I am unable to reach him.    The patient voices understanding of current disease status and treatment options and is in agreement with the current care plan.    The patient voices understanding of current disease status and treatment options and is in agreement with the current care plan.  All questions were answered. The patient knows to call the clinic with any problems, questions or concerns. We can certainly see the patient much sooner if necessary.  Thank you so much for allowing me to participate in the care of Marney Setting. I will continue to follow up the patient with you and assist in his care.  Disclaimer: This note was dictated with voice recognition software. Similar sounding words can inadvertently be transcribed and may not be corrected upon review.   Lashun Ramseyer L Audreyana Huntsberry December 17, 2022, 2:22  PM  ADDENDUM: Hematology/Oncology Attending: I had a face-to-face encounter with the patient today.  I reviewed his record, lab and recommended his care plan.  She is a very pleasant 72 years old white male with past medical history significant for moderate obesity, hypertension, coronary artery disease, chronic kidney disease, gout, diabetes mellitus as well as osteoarthritis and recent laparoscopic cholecystectomy.  The patient was noted by his primary care provider after a recent hospitalization for acute calculus cholecystitis to have elevated white blood count up to 15.2 in addition to elevated platelets count of 598,000.  He has previous record for more than 10 years with elevated white blood count that has been up and down over the last several years and also depend on his presentation at that time. He was referred to [place] today for evaluation and recommendation regarding his condition. The patient is feeling fine with no concerning complaints.  Will order several studies today including repeat CBC that showed total white blood count of 12.0 with a normal hemoglobin, hematocrit and platelets count.  He has normal iron study and ferritin.  LDH was elevated at 280. Will also order FISH study for BCR/ABL in addition to JAK2 mutation. Be reactive in nature secondary to the recent inflammatory process as well as previous infections.  We will wait for the molecular study to rule out any underlying myeloproliferative disorder. We will see the patient back for follow-up visit in 3 months for evaluation and repeat CBC. The patient was advised to call immediately if he has any other concerning symptoms in the interval. The total time spent in the appointment was 60 minutes.  Disclaimer: This note was dictated with voice recognition software. Similar sounding words can inadvertently be transcribed and may be missed upon review. Lajuana Matte, MD

## 2022-12-17 ENCOUNTER — Other Ambulatory Visit: Payer: Self-pay | Admitting: Physician Assistant

## 2022-12-17 ENCOUNTER — Inpatient Hospital Stay: Payer: PPO | Attending: Physician Assistant | Admitting: Physician Assistant

## 2022-12-17 ENCOUNTER — Inpatient Hospital Stay: Payer: PPO

## 2022-12-17 VITALS — BP 148/68 | HR 106 | Temp 98.0°F | Resp 17 | Wt 266.6 lb

## 2022-12-17 DIAGNOSIS — Z794 Long term (current) use of insulin: Secondary | ICD-10-CM | POA: Diagnosis not present

## 2022-12-17 DIAGNOSIS — Z87891 Personal history of nicotine dependence: Secondary | ICD-10-CM | POA: Diagnosis not present

## 2022-12-17 DIAGNOSIS — I129 Hypertensive chronic kidney disease with stage 1 through stage 4 chronic kidney disease, or unspecified chronic kidney disease: Secondary | ICD-10-CM | POA: Insufficient documentation

## 2022-12-17 DIAGNOSIS — N183 Chronic kidney disease, stage 3 unspecified: Secondary | ICD-10-CM | POA: Diagnosis not present

## 2022-12-17 DIAGNOSIS — M1990 Unspecified osteoarthritis, unspecified site: Secondary | ICD-10-CM | POA: Insufficient documentation

## 2022-12-17 DIAGNOSIS — D72829 Elevated white blood cell count, unspecified: Secondary | ICD-10-CM | POA: Insufficient documentation

## 2022-12-17 DIAGNOSIS — I2510 Atherosclerotic heart disease of native coronary artery without angina pectoris: Secondary | ICD-10-CM | POA: Diagnosis not present

## 2022-12-17 DIAGNOSIS — Z79899 Other long term (current) drug therapy: Secondary | ICD-10-CM | POA: Diagnosis not present

## 2022-12-17 DIAGNOSIS — Z7984 Long term (current) use of oral hypoglycemic drugs: Secondary | ICD-10-CM | POA: Diagnosis not present

## 2022-12-17 DIAGNOSIS — E1122 Type 2 diabetes mellitus with diabetic chronic kidney disease: Secondary | ICD-10-CM | POA: Diagnosis not present

## 2022-12-17 DIAGNOSIS — M109 Gout, unspecified: Secondary | ICD-10-CM | POA: Diagnosis not present

## 2022-12-17 DIAGNOSIS — E669 Obesity, unspecified: Secondary | ICD-10-CM | POA: Insufficient documentation

## 2022-12-17 DIAGNOSIS — Z7982 Long term (current) use of aspirin: Secondary | ICD-10-CM | POA: Insufficient documentation

## 2022-12-17 LAB — CMP (CANCER CENTER ONLY)
ALT: 23 U/L (ref 0–44)
AST: 21 U/L (ref 15–41)
Albumin: 3.8 g/dL (ref 3.5–5.0)
Alkaline Phosphatase: 58 U/L (ref 38–126)
Anion gap: 8 (ref 5–15)
BUN: 26 mg/dL — ABNORMAL HIGH (ref 8–23)
CO2: 28 mmol/L (ref 22–32)
Calcium: 9.5 mg/dL (ref 8.9–10.3)
Chloride: 102 mmol/L (ref 98–111)
Creatinine: 1.27 mg/dL — ABNORMAL HIGH (ref 0.61–1.24)
GFR, Estimated: 60 mL/min — ABNORMAL LOW (ref >60)
Glucose, Bld: 203 mg/dL — ABNORMAL HIGH (ref 70–99)
Potassium: 4.5 mmol/L (ref 3.5–5.1)
Sodium: 138 mmol/L (ref 135–145)
Total Bilirubin: 0.5 mg/dL (ref 0.3–1.2)
Total Protein: 7.1 g/dL (ref 6.5–8.1)

## 2022-12-17 LAB — CBC WITH DIFFERENTIAL (CANCER CENTER ONLY)
Abs Immature Granulocytes: 0.04 10*3/uL (ref 0.00–0.07)
Basophils Absolute: 0.1 10*3/uL (ref 0.0–0.1)
Basophils Relative: 1 %
Eosinophils Absolute: 0.5 10*3/uL (ref 0.0–0.5)
Eosinophils Relative: 4 %
HCT: 40.2 % (ref 39.0–52.0)
Hemoglobin: 13.6 g/dL (ref 13.0–17.0)
Immature Granulocytes: 0 %
Lymphocytes Relative: 25 %
Lymphs Abs: 3 10*3/uL (ref 0.7–4.0)
MCH: 29.5 pg (ref 26.0–34.0)
MCHC: 33.8 g/dL (ref 30.0–36.0)
MCV: 87.2 fL (ref 80.0–100.0)
Monocytes Absolute: 0.9 10*3/uL (ref 0.1–1.0)
Monocytes Relative: 7 %
Neutro Abs: 7.6 10*3/uL (ref 1.7–7.7)
Neutrophils Relative %: 63 %
Platelet Count: 224 10*3/uL (ref 150–400)
RBC: 4.61 MIL/uL (ref 4.22–5.81)
RDW: 14.7 % (ref 11.5–15.5)
WBC Count: 12 10*3/uL — ABNORMAL HIGH (ref 4.0–10.5)
nRBC: 0 % (ref 0.0–0.2)

## 2022-12-17 LAB — IRON AND IRON BINDING CAPACITY (CC-WL,HP ONLY)
Iron: 93 ug/dL (ref 45–182)
Saturation Ratios: 26 % (ref 17.9–39.5)
TIBC: 356 ug/dL (ref 250–450)
UIBC: 263 ug/dL (ref 117–376)

## 2022-12-17 LAB — LACTATE DEHYDROGENASE: LDH: 280 U/L — ABNORMAL HIGH (ref 98–192)

## 2022-12-17 LAB — FERRITIN: Ferritin: 68 ng/mL (ref 24–336)

## 2022-12-24 ENCOUNTER — Telehealth: Payer: Self-pay | Admitting: Physician Assistant

## 2022-12-24 LAB — BCR ABL1 FISH (GENPATH)

## 2022-12-24 LAB — JAK2 (INCLUDING V617F AND EXON 12), MPL,& CALR W/RFL MPN PANEL (NGS)

## 2022-12-24 NOTE — Telephone Encounter (Signed)
I called the patient to review the results of his BCR/ABL and JAK2.  Both of these came back normal.  The patient gave me permission at his appointment to leave a detailed voicemail with the results.  I left a voicemail letting him know that his labs are normal and the plan is to see him back in 3 months with repeat blood work.  Of course if he has any further questions about this or would like to discuss this further I left our callback number.

## 2022-12-29 DIAGNOSIS — Z86010 Personal history of colonic polyps: Secondary | ICD-10-CM | POA: Diagnosis not present

## 2022-12-29 DIAGNOSIS — K219 Gastro-esophageal reflux disease without esophagitis: Secondary | ICD-10-CM | POA: Diagnosis not present

## 2022-12-29 DIAGNOSIS — D649 Anemia, unspecified: Secondary | ICD-10-CM | POA: Diagnosis not present

## 2023-01-08 DIAGNOSIS — M25472 Effusion, left ankle: Secondary | ICD-10-CM | POA: Diagnosis not present

## 2023-01-08 DIAGNOSIS — N1831 Chronic kidney disease, stage 3a: Secondary | ICD-10-CM | POA: Diagnosis not present

## 2023-01-08 DIAGNOSIS — K760 Fatty (change of) liver, not elsewhere classified: Secondary | ICD-10-CM | POA: Diagnosis not present

## 2023-01-08 DIAGNOSIS — Z79899 Other long term (current) drug therapy: Secondary | ICD-10-CM | POA: Diagnosis not present

## 2023-01-08 DIAGNOSIS — M109 Gout, unspecified: Secondary | ICD-10-CM | POA: Diagnosis not present

## 2023-01-08 DIAGNOSIS — M459 Ankylosing spondylitis of unspecified sites in spine: Secondary | ICD-10-CM | POA: Diagnosis not present

## 2023-01-08 DIAGNOSIS — M25572 Pain in left ankle and joints of left foot: Secondary | ICD-10-CM | POA: Diagnosis not present

## 2023-01-08 DIAGNOSIS — E1169 Type 2 diabetes mellitus with other specified complication: Secondary | ICD-10-CM | POA: Diagnosis not present

## 2023-01-13 DIAGNOSIS — D2271 Melanocytic nevi of right lower limb, including hip: Secondary | ICD-10-CM | POA: Diagnosis not present

## 2023-01-13 DIAGNOSIS — D225 Melanocytic nevi of trunk: Secondary | ICD-10-CM | POA: Diagnosis not present

## 2023-01-13 DIAGNOSIS — L821 Other seborrheic keratosis: Secondary | ICD-10-CM | POA: Diagnosis not present

## 2023-01-13 DIAGNOSIS — Z85828 Personal history of other malignant neoplasm of skin: Secondary | ICD-10-CM | POA: Diagnosis not present

## 2023-01-13 DIAGNOSIS — D1801 Hemangioma of skin and subcutaneous tissue: Secondary | ICD-10-CM | POA: Diagnosis not present

## 2023-01-13 DIAGNOSIS — L570 Actinic keratosis: Secondary | ICD-10-CM | POA: Diagnosis not present

## 2023-01-13 DIAGNOSIS — D2371 Other benign neoplasm of skin of right lower limb, including hip: Secondary | ICD-10-CM | POA: Diagnosis not present

## 2023-01-13 DIAGNOSIS — D2272 Melanocytic nevi of left lower limb, including hip: Secondary | ICD-10-CM | POA: Diagnosis not present

## 2023-01-13 DIAGNOSIS — D040 Carcinoma in situ of skin of lip: Secondary | ICD-10-CM | POA: Diagnosis not present

## 2023-01-19 DIAGNOSIS — H52209 Unspecified astigmatism, unspecified eye: Secondary | ICD-10-CM | POA: Diagnosis not present

## 2023-01-19 DIAGNOSIS — H2512 Age-related nuclear cataract, left eye: Secondary | ICD-10-CM | POA: Diagnosis not present

## 2023-01-20 DIAGNOSIS — H2511 Age-related nuclear cataract, right eye: Secondary | ICD-10-CM | POA: Diagnosis not present

## 2023-02-02 DIAGNOSIS — H52209 Unspecified astigmatism, unspecified eye: Secondary | ICD-10-CM | POA: Diagnosis not present

## 2023-02-02 DIAGNOSIS — H25011 Cortical age-related cataract, right eye: Secondary | ICD-10-CM | POA: Diagnosis not present

## 2023-02-02 DIAGNOSIS — H25041 Posterior subcapsular polar age-related cataract, right eye: Secondary | ICD-10-CM | POA: Diagnosis not present

## 2023-02-02 DIAGNOSIS — H2511 Age-related nuclear cataract, right eye: Secondary | ICD-10-CM | POA: Diagnosis not present

## 2023-02-16 DIAGNOSIS — D649 Anemia, unspecified: Secondary | ICD-10-CM | POA: Diagnosis not present

## 2023-02-16 DIAGNOSIS — D122 Benign neoplasm of ascending colon: Secondary | ICD-10-CM | POA: Diagnosis not present

## 2023-02-16 DIAGNOSIS — K2270 Barrett's esophagus without dysplasia: Secondary | ICD-10-CM | POA: Diagnosis not present

## 2023-02-16 DIAGNOSIS — Z86010 Personal history of colonic polyps: Secondary | ICD-10-CM | POA: Diagnosis not present

## 2023-02-16 DIAGNOSIS — K2980 Duodenitis without bleeding: Secondary | ICD-10-CM | POA: Diagnosis not present

## 2023-02-16 DIAGNOSIS — K2940 Chronic atrophic gastritis without bleeding: Secondary | ICD-10-CM | POA: Diagnosis not present

## 2023-02-16 DIAGNOSIS — K219 Gastro-esophageal reflux disease without esophagitis: Secondary | ICD-10-CM | POA: Diagnosis not present

## 2023-02-16 DIAGNOSIS — K31A19 Gastric intestinal metaplasia without dysplasia, unspecified site: Secondary | ICD-10-CM | POA: Diagnosis not present

## 2023-02-16 DIAGNOSIS — D123 Benign neoplasm of transverse colon: Secondary | ICD-10-CM | POA: Diagnosis not present

## 2023-02-16 DIAGNOSIS — K2930 Chronic superficial gastritis without bleeding: Secondary | ICD-10-CM | POA: Diagnosis not present

## 2023-02-16 DIAGNOSIS — Z09 Encounter for follow-up examination after completed treatment for conditions other than malignant neoplasm: Secondary | ICD-10-CM | POA: Diagnosis not present

## 2023-02-16 DIAGNOSIS — B9681 Helicobacter pylori [H. pylori] as the cause of diseases classified elsewhere: Secondary | ICD-10-CM | POA: Diagnosis not present

## 2023-02-16 DIAGNOSIS — K5730 Diverticulosis of large intestine without perforation or abscess without bleeding: Secondary | ICD-10-CM | POA: Diagnosis not present

## 2023-02-16 DIAGNOSIS — D124 Benign neoplasm of descending colon: Secondary | ICD-10-CM | POA: Diagnosis not present

## 2023-02-16 DIAGNOSIS — K648 Other hemorrhoids: Secondary | ICD-10-CM | POA: Diagnosis not present

## 2023-02-16 DIAGNOSIS — D120 Benign neoplasm of cecum: Secondary | ICD-10-CM | POA: Diagnosis not present

## 2023-02-17 DIAGNOSIS — D84821 Immunodeficiency due to drugs: Secondary | ICD-10-CM | POA: Diagnosis not present

## 2023-02-17 DIAGNOSIS — M459 Ankylosing spondylitis of unspecified sites in spine: Secondary | ICD-10-CM | POA: Diagnosis not present

## 2023-02-17 DIAGNOSIS — I10 Essential (primary) hypertension: Secondary | ICD-10-CM | POA: Diagnosis not present

## 2023-02-17 DIAGNOSIS — E1142 Type 2 diabetes mellitus with diabetic polyneuropathy: Secondary | ICD-10-CM | POA: Diagnosis not present

## 2023-02-17 DIAGNOSIS — Z23 Encounter for immunization: Secondary | ICD-10-CM | POA: Diagnosis not present

## 2023-02-18 ENCOUNTER — Inpatient Hospital Stay (HOSPITAL_COMMUNITY)
Admission: EM | Admit: 2023-02-18 | Discharge: 2023-02-20 | DRG: 919 | Disposition: A | Discharge status: Home / Self Care | Payer: PPO | Attending: Internal Medicine | Admitting: Internal Medicine

## 2023-02-18 ENCOUNTER — Emergency Department (HOSPITAL_COMMUNITY): Payer: PPO

## 2023-02-18 DIAGNOSIS — K760 Fatty (change of) liver, not elsewhere classified: Secondary | ICD-10-CM | POA: Diagnosis not present

## 2023-02-18 DIAGNOSIS — Z888 Allergy status to other drugs, medicaments and biological substances status: Secondary | ICD-10-CM

## 2023-02-18 DIAGNOSIS — I129 Hypertensive chronic kidney disease with stage 1 through stage 4 chronic kidney disease, or unspecified chronic kidney disease: Secondary | ICD-10-CM | POA: Diagnosis present

## 2023-02-18 DIAGNOSIS — D62 Acute posthemorrhagic anemia: Secondary | ICD-10-CM | POA: Diagnosis not present

## 2023-02-18 DIAGNOSIS — Z7984 Long term (current) use of oral hypoglycemic drugs: Secondary | ICD-10-CM

## 2023-02-18 DIAGNOSIS — R17 Unspecified jaundice: Secondary | ICD-10-CM | POA: Insufficient documentation

## 2023-02-18 DIAGNOSIS — M1990 Unspecified osteoarthritis, unspecified site: Secondary | ICD-10-CM | POA: Diagnosis present

## 2023-02-18 DIAGNOSIS — E871 Hypo-osmolality and hyponatremia: Secondary | ICD-10-CM | POA: Diagnosis not present

## 2023-02-18 DIAGNOSIS — K91840 Postprocedural hemorrhage and hematoma of a digestive system organ or structure following a digestive system procedure: Principal | ICD-10-CM | POA: Diagnosis present

## 2023-02-18 DIAGNOSIS — D84821 Immunodeficiency due to drugs: Secondary | ICD-10-CM | POA: Diagnosis not present

## 2023-02-18 DIAGNOSIS — M109 Gout, unspecified: Secondary | ICD-10-CM | POA: Diagnosis present

## 2023-02-18 DIAGNOSIS — Z7982 Long term (current) use of aspirin: Secondary | ICD-10-CM

## 2023-02-18 DIAGNOSIS — K759 Inflammatory liver disease, unspecified: Secondary | ICD-10-CM | POA: Diagnosis not present

## 2023-02-18 DIAGNOSIS — B9681 Helicobacter pylori [H. pylori] as the cause of diseases classified elsewhere: Secondary | ICD-10-CM | POA: Diagnosis not present

## 2023-02-18 DIAGNOSIS — K2940 Chronic atrophic gastritis without bleeding: Secondary | ICD-10-CM | POA: Diagnosis not present

## 2023-02-18 DIAGNOSIS — D123 Benign neoplasm of transverse colon: Secondary | ICD-10-CM | POA: Diagnosis not present

## 2023-02-18 DIAGNOSIS — K625 Hemorrhage of anus and rectum: Secondary | ICD-10-CM | POA: Diagnosis not present

## 2023-02-18 DIAGNOSIS — Z87442 Personal history of urinary calculi: Secondary | ICD-10-CM

## 2023-02-18 DIAGNOSIS — Z833 Family history of diabetes mellitus: Secondary | ICD-10-CM

## 2023-02-18 DIAGNOSIS — I2510 Atherosclerotic heart disease of native coronary artery without angina pectoris: Secondary | ICD-10-CM | POA: Diagnosis not present

## 2023-02-18 DIAGNOSIS — K921 Melena: Secondary | ICD-10-CM | POA: Diagnosis present

## 2023-02-18 DIAGNOSIS — E875 Hyperkalemia: Secondary | ICD-10-CM | POA: Diagnosis not present

## 2023-02-18 DIAGNOSIS — K7200 Acute and subacute hepatic failure without coma: Secondary | ICD-10-CM | POA: Diagnosis present

## 2023-02-18 DIAGNOSIS — R7401 Elevation of levels of liver transaminase levels: Secondary | ICD-10-CM | POA: Diagnosis present

## 2023-02-18 DIAGNOSIS — Z87891 Personal history of nicotine dependence: Secondary | ICD-10-CM

## 2023-02-18 DIAGNOSIS — K649 Unspecified hemorrhoids: Secondary | ICD-10-CM | POA: Diagnosis not present

## 2023-02-18 DIAGNOSIS — K2270 Barrett's esophagus without dysplasia: Secondary | ICD-10-CM | POA: Diagnosis not present

## 2023-02-18 DIAGNOSIS — N179 Acute kidney failure, unspecified: Secondary | ICD-10-CM | POA: Insufficient documentation

## 2023-02-18 DIAGNOSIS — Z79899 Other long term (current) drug therapy: Secondary | ICD-10-CM

## 2023-02-18 DIAGNOSIS — Z79620 Long term (current) use of immunosuppressive biologic: Secondary | ICD-10-CM

## 2023-02-18 DIAGNOSIS — I10 Essential (primary) hypertension: Secondary | ICD-10-CM | POA: Diagnosis not present

## 2023-02-18 DIAGNOSIS — K2970 Gastritis, unspecified, without bleeding: Secondary | ICD-10-CM | POA: Diagnosis not present

## 2023-02-18 DIAGNOSIS — N1832 Chronic kidney disease, stage 3b: Secondary | ICD-10-CM | POA: Diagnosis present

## 2023-02-18 DIAGNOSIS — K5790 Diverticulosis of intestine, part unspecified, without perforation or abscess without bleeding: Secondary | ICD-10-CM | POA: Diagnosis present

## 2023-02-18 DIAGNOSIS — K922 Gastrointestinal hemorrhage, unspecified: Secondary | ICD-10-CM | POA: Diagnosis not present

## 2023-02-18 DIAGNOSIS — Z86010 Personal history of colonic polyps: Secondary | ICD-10-CM

## 2023-02-18 DIAGNOSIS — R5381 Other malaise: Secondary | ICD-10-CM | POA: Diagnosis present

## 2023-02-18 DIAGNOSIS — Y838 Other surgical procedures as the cause of abnormal reaction of the patient, or of later complication, without mention of misadventure at the time of the procedure: Secondary | ICD-10-CM | POA: Diagnosis present

## 2023-02-18 DIAGNOSIS — D122 Benign neoplasm of ascending colon: Secondary | ICD-10-CM | POA: Diagnosis not present

## 2023-02-18 DIAGNOSIS — E1122 Type 2 diabetes mellitus with diabetic chronic kidney disease: Secondary | ICD-10-CM | POA: Diagnosis present

## 2023-02-18 DIAGNOSIS — Z9049 Acquired absence of other specified parts of digestive tract: Secondary | ICD-10-CM | POA: Diagnosis not present

## 2023-02-18 DIAGNOSIS — R945 Abnormal results of liver function studies: Secondary | ICD-10-CM | POA: Diagnosis not present

## 2023-02-18 DIAGNOSIS — D500 Iron deficiency anemia secondary to blood loss (chronic): Secondary | ICD-10-CM | POA: Insufficient documentation

## 2023-02-18 DIAGNOSIS — K2980 Duodenitis without bleeding: Secondary | ICD-10-CM | POA: Diagnosis present

## 2023-02-18 DIAGNOSIS — E785 Hyperlipidemia, unspecified: Secondary | ICD-10-CM | POA: Diagnosis present

## 2023-02-18 DIAGNOSIS — D124 Benign neoplasm of descending colon: Secondary | ICD-10-CM | POA: Diagnosis not present

## 2023-02-18 DIAGNOSIS — D120 Benign neoplasm of cecum: Secondary | ICD-10-CM | POA: Diagnosis not present

## 2023-02-18 LAB — CBC
HCT: 35.8 % — ABNORMAL LOW (ref 39.0–52.0)
Hemoglobin: 11.5 g/dL — ABNORMAL LOW (ref 13.0–17.0)
MCH: 29 pg (ref 26.0–34.0)
MCHC: 32.1 g/dL (ref 30.0–36.0)
MCV: 90.2 fL (ref 80.0–100.0)
Platelets: 235 10*3/uL (ref 150–400)
RBC: 3.97 MIL/uL — ABNORMAL LOW (ref 4.22–5.81)
RDW: 14.2 % (ref 11.5–15.5)
WBC: 16.3 10*3/uL — ABNORMAL HIGH (ref 4.0–10.5)
nRBC: 0 % (ref 0.0–0.2)

## 2023-02-18 LAB — TYPE AND SCREEN
ABO/RH(D): A POS
Antibody Screen: NEGATIVE

## 2023-02-18 LAB — COMPREHENSIVE METABOLIC PANEL
ALT: 526 U/L — ABNORMAL HIGH (ref 0–44)
AST: 897 U/L — ABNORMAL HIGH (ref 15–41)
Albumin: 3.4 g/dL — ABNORMAL LOW (ref 3.5–5.0)
Alkaline Phosphatase: 108 U/L (ref 38–126)
Anion gap: 12 (ref 5–15)
BUN: 46 mg/dL — ABNORMAL HIGH (ref 8–23)
CO2: 22 mmol/L (ref 22–32)
Calcium: 8.6 mg/dL — ABNORMAL LOW (ref 8.9–10.3)
Chloride: 97 mmol/L — ABNORMAL LOW (ref 98–111)
Creatinine, Ser: 2.31 mg/dL — ABNORMAL HIGH (ref 0.61–1.24)
GFR, Estimated: 29 mL/min — ABNORMAL LOW (ref >60)
Glucose, Bld: 311 mg/dL — ABNORMAL HIGH (ref 70–99)
Potassium: 5.7 mmol/L — ABNORMAL HIGH (ref 3.5–5.1)
Sodium: 131 mmol/L — ABNORMAL LOW (ref 135–145)
Total Bilirubin: 4.1 mg/dL — ABNORMAL HIGH (ref 0.3–1.2)
Total Protein: 6.3 g/dL — ABNORMAL LOW (ref 6.5–8.1)

## 2023-02-18 LAB — PROTIME-INR
INR: 1.3 — ABNORMAL HIGH (ref 0.8–1.2)
Prothrombin Time: 15.9 s — ABNORMAL HIGH (ref 11.4–15.2)

## 2023-02-18 LAB — LIPASE, BLOOD: Lipase: 30 U/L (ref 11–51)

## 2023-02-18 LAB — APTT: aPTT: 28 s (ref 24–36)

## 2023-02-18 MED ORDER — PANTOPRAZOLE SODIUM 40 MG PO TBEC
40.0000 mg | DELAYED_RELEASE_TABLET | Freq: Every day | ORAL | Status: DC
  Administered 2023-02-19 – 2023-02-20 (×2): 40 mg via ORAL
  Filled 2023-02-18 (×2): qty 1

## 2023-02-18 MED ORDER — SODIUM CHLORIDE 0.9 % IV BOLUS
500.0000 mL | Freq: Once | INTRAVENOUS | Status: AC
  Administered 2023-02-18: 500 mL via INTRAVENOUS

## 2023-02-18 MED ORDER — CALCIUM GLUCONATE-NACL 1-0.675 GM/50ML-% IV SOLN
1.0000 g | Freq: Once | INTRAVENOUS | Status: AC
  Administered 2023-02-18: 1000 mg via INTRAVENOUS
  Filled 2023-02-18: qty 50

## 2023-02-18 MED ORDER — INSULIN DETEMIR 100 UNIT/ML ~~LOC~~ SOLN
70.0000 [IU] | Freq: Every day | SUBCUTANEOUS | Status: DC
  Administered 2023-02-19: 70 [IU] via SUBCUTANEOUS
  Filled 2023-02-18 (×2): qty 0.7

## 2023-02-18 MED ORDER — SODIUM ZIRCONIUM CYCLOSILICATE 10 G PO PACK
10.0000 g | PACK | Freq: Three times a day (TID) | ORAL | Status: DC
  Administered 2023-02-19 – 2023-02-20 (×5): 10 g via ORAL
  Filled 2023-02-18 (×5): qty 1

## 2023-02-18 MED ORDER — INSULIN ASPART 100 UNIT/ML IJ SOLN
0.0000 [IU] | Freq: Three times a day (TID) | INTRAMUSCULAR | Status: DC
  Administered 2023-02-19: 3 [IU] via SUBCUTANEOUS
  Administered 2023-02-19: 5 [IU] via SUBCUTANEOUS
  Administered 2023-02-19 – 2023-02-20 (×2): 2 [IU] via SUBCUTANEOUS

## 2023-02-18 MED ORDER — INSULIN ASPART 100 UNIT/ML IV SOLN
5.0000 [IU] | Freq: Once | INTRAVENOUS | Status: AC
  Administered 2023-02-19: 5 [IU] via INTRAVENOUS

## 2023-02-18 MED ORDER — SODIUM CHLORIDE 0.9 % IV BOLUS
500.0000 mL | Freq: Once | INTRAVENOUS | Status: AC
  Administered 2023-02-19: 500 mL via INTRAVENOUS

## 2023-02-18 NOTE — ED Triage Notes (Signed)
Pt arrives via POV. Pt reports having a colonoscopy on Monday. Pt states he has had rectal bleeding since yesterday. Pt denies any associated symptoms. Pt AxOx4.

## 2023-02-18 NOTE — ED Provider Notes (Signed)
EMERGENCY DEPARTMENT AT [facility] Provider Note   CSN: [id number] Arrival date & time: 02/18/23  1715     History {Add pertinent medical, surgical, social history, OB history to HPI:1} Chief Complaint  Patient presents with   Blood In Stools    Steven Mcclure is a 72 y.o. male.  This is a 72 year old male presenting emergency department for bright red blood in his stool x 3 today.  Had recent colonoscopy with [facility] this past Monday.  Reported doing well until today when he had 3 episodes of bright red bloody bowel movements.  He denies any lightheadedness, dizziness, chest pain, shortness of breath.  No abdominal pain.  No nausea no vomiting no diarrhea.  Reportedly had been eating and drinking okay for the past few days.        Home Medications Prior to Admission medications   Medication Sig Start Date End Date Taking? Authorizing Provider  acetaminophen (TYLENOL) 325 MG tablet Take 2 tablets (650 mg total) by mouth every 6 (six) hours as needed for headache or mild pain. 11/05/22   Juliet Rude, PA-C  Adalimumab (HUMIRA PEN [place]) Inject 1 Dose into the skin every 14 (fourteen) days.    [provider]  albuterol (VENTOLIN HFA) 108 (90 Base) MCG/ACT inhaler Inhale 1-2 puffs into the lungs every 6 (six) hours as needed for shortness of breath or wheezing. 06/25/18   [provider]  allopurinol (ZYLOPRIM) 100 MG tablet TAKE 1 TABLET BY MOUTH TWICE A DAY Patient taking differently: Take 200 mg by mouth daily. 10/08/22   Hyatt, Max T, DPM  aspirin EC 81 MG tablet Take 81 mg by mouth daily.    [provider]  Cyanocobalamin (VITAMIN B-12 PO) Take 1 tablet by mouth daily.    [provider]  ezetimibe (ZETIA) 10 MG tablet Take 10 mg by mouth every other day. 10/08/22   [provider]  LANTUS 100 UNIT/ML injection Inject 90 Units into the skin daily. Patient not taking: Reported on 12/17/2022 08/28/16   [provider]  leflunomide (ARAVA) 20 MG tablet Take 20 mg by mouth daily. 08/15/22   [provider]  loratadine (CLARITIN) 10 MG tablet Take 10 mg by mouth daily.    [provider]  metFORMIN (GLUCOPHAGE) 1000 MG tablet Take 1 tablet (1,000 mg total) by mouth daily. 12/26/20   Corky Crafts, MD  Multiple Vitamin (MULTIVITAMIN WITH MINERALS) TABS tablet Take 1 tablet by mouth daily.    [provider]  Omega-3 Fatty Acids (FISH OIL) 1200 MG CAPS Take 1,200 mg by mouth daily.    [provider]  oxyCODONE (OXY IR/ROXICODONE) 5 MG immediate release tablet Take 1 tablet (5 mg total) by mouth every 6 (six) hours as needed for severe pain or breakthrough pain. 11/05/22   Juliet Rude, PA-C  pantoprazole (PROTONIX) 40 MG tablet Take 1 tablet (40 mg total) by mouth daily. 11/02/22 01/31/23  Gloris Manchester, MD  rosuvastatin (CRESTOR) 20 MG tablet Take 1 tablet (20 mg total) by mouth daily. 04/03/22   Jake Bathe, MD  sucralfate (CARAFATE) 1 GM/10ML suspension Take 10 mLs (1 g total) by mouth with breakfast, with lunch, and with evening meal. 11/02/22   Gloris Manchester, MD  TRULICITY 3 MG/0.5ML SOPN Inject 3 mg as directed every Friday. 07/28/20   [provider]      Allergies    Crestor [rosuvastatin], Lipitor [atorvastatin], Hydrocod poli-chlorphe poli er, and  Tussin cough [dextromethorphan hbr]    Review of Systems   Review of Systems  Physical Exam Updated Vital Signs BP 132/63 (BP Location: Right Arm)   Pulse 95   Temp 98 F (36.7 C) (Oral)   Resp 19   SpO2 100%  Physical Exam Vitals and nursing note reviewed.  Constitutional:      General: He is not in acute distress.    Appearance: He is not toxic-appearing.  HENT:     Head: Normocephalic.     Nose: Nose normal.     Mouth/Throat:     Mouth: Mucous membranes are moist.  Eyes:     Conjunctiva/sclera: Conjunctivae normal.  Cardiovascular:     Rate and Rhythm: Normal rate and regular  rhythm.  Pulmonary:     Effort: Pulmonary effort is normal.     Breath sounds: Normal breath sounds.  Abdominal:     General: Abdomen is flat. There is no distension.     Tenderness: There is no abdominal tenderness. There is no guarding or rebound.  Musculoskeletal:        General: Normal range of motion.     Right lower leg: No edema.     Left lower leg: No edema.  Skin:    General: Skin is warm.     Capillary Refill: Capillary refill takes less than 2 seconds.  Neurological:     Mental Status: He is alert and oriented to person, place, and time.  Psychiatric:        Mood and Affect: Mood normal.        Behavior: Behavior normal.     ED Results / Procedures / Treatments   Labs (all labs ordered are listed, but only abnormal results are displayed) Labs Reviewed  COMPREHENSIVE METABOLIC PANEL - Abnormal; Notable for the following components:      Result Value   Sodium 131 (*)    Potassium 5.7 (*)    Chloride 97 (*)    Glucose, Bld 311 (*)    BUN 46 (*)    Creatinine, Ser 2.31 (*)    Calcium 8.6 (*)    Total Protein 6.3 (*)    Albumin 3.4 (*)    AST 897 (*)    ALT 526 (*)    Total Bilirubin 4.1 (*)    GFR, Estimated 29 (*)    All other components within normal limits  CBC - Abnormal; Notable for the following components:   WBC 16.3 (*)    RBC 3.97 (*)    Hemoglobin 11.5 (*)    HCT 35.8 (*)    All other components within normal limits  PROTIME-INR  APTT  LIPASE, BLOOD  ACETAMINOPHEN LEVEL  POC OCCULT BLOOD, ED  TYPE AND SCREEN    EKG None  Radiology No results found.  Procedures Procedures  {Document cardiac monitor, telemetry assessment procedure when appropriate:1}  Medications Ordered in ED Medications  calcium gluconate 1 g/ 50 mL sodium chloride IVPB (has no administration in time range)  sodium chloride 0.9 % bolus 500 mL (has no administration in time range)    ED Course/ Medical Decision Making/ A&P Clinical Course as of 02/18/23 2234   Wed Feb 18, 2023  2033 Echo 5/27"1. Left ventricular ejection fraction, by estimation, is 65 to 70%. The  left ventricle has normal function. The left ventricle has no regional  wall motion abnormalities. There is mild left ventricular hypertrophy.  Indeterminate diastolic filling due to  E-A fusion.  " [TY]  2106 CBC(!) Hemoglobin 13 down to 11.5  [TY]  2128 Per oncology note 7/10"71 y.o. male with a past history significant for obesity, hypertension, coronary artery disease, cholelithiasis status post laparoscopic cholecystectomy, CKD, gout, diabetes, obesity, and arthritis is referred to the clinic for leukocytosis.   The patient had a hospital follow-up on 11/17/2022 with his PCP.  He was hospitalized for acute calculus cholecystitis status post laparoscopic cholecystectomy in May 2024. " [TY]  2134 From discharge summary on admission 11/02/22:"Acute kidney injury on chronic kidney disease stage IIIb: Baseline creatinine about 1.3. Creatinine remains in the range of 1.8.  Lisinopril held.  He is normotensive without any blood pressure medications.  Check BMP in a week" [TY]  2209 CTA 11/02/22"MPRESSION: 1. No acute findings in the chest, abdomen or pelvis. 2. Atherosclerosis of the thoracic and abdominal aorta without evidence of aneurysm or dissection. 3. Significant coronary atherosclerosis with calcified coronary artery plaque in a 3 vessel distribution. This has been documented previously by coronary calcium score study in 2022. 4. Hepatic steatosis. 5. Cholelithiasis without evidence of cholecystitis or biliary obstruction. 6. Cluster of nonobstructing calculi in the lower pole of the right kidney measuring up to 10 mm. 7. Diverticulosis of the lower descending and entire sigmoid colon without evidence of diverticulitis." [TY]    Clinical Course User Index [TY] Coral Spikes, DO   {   Click here for ABCD2, HEART and other calculatorsREFRESH Note before signing :1}                               Medical Decision Making This is a 72 year old male presented to the emergency department for bloody stools.  He is afebrile nontachycardic hemodynamically stable.  Physical exam reassuring.  Patient does not appear to be in acute distress.  He has a minor drop in his hemoglobin, but no tachycardia or hemodynamic instability.  Labs ordered by triage reviewed patient with AKI, hyperkalemia with no EKG changes.  Has new transaminitis and elevated bilirubin.  Had recent cholecystectomy, and per chart review appears that his liver enzymes were normal prior to today.  He is not having any abdominal pain, nausea or vomiting.  Ultrasound added on to evaluate for CBD pathology.  Case discussed with gastroenterology who recommends MRCP if ultrasound is inconclusive.  They will see patient in the morning and evaluate for possible endoscope.  Patient given calcium and IV fluids.  Will admit for further monitoring given his AKI, hyperkalemia, lower GI bleed, and new onset hepatitis.  Amount and/or Complexity of Data Reviewed External Data Reviewed:     Details: See ED course Labs: ordered. Decision-making details documented in ED Course. Radiology: ordered. ECG/medicine tests: ordered.  Risk Prescription drug management.   ***  {Document critical care time when appropriate:1} {Document review of labs and clinical decision tools ie heart score, Chads2Vasc2 etc:1}  {Document your independent review of radiology images, and any outside records:1} {Document your discussion with family members, caretakers, and with consultants:1} {Document social determinants of health affecting pt's care:1} {Document your decision making why or why not admission, treatments were needed:1} Final Clinical Impression(s) / ED Diagnoses Final diagnoses:  None    Rx / DC Orders ED Discharge Orders     None

## 2023-02-19 DIAGNOSIS — K922 Gastrointestinal hemorrhage, unspecified: Secondary | ICD-10-CM

## 2023-02-19 DIAGNOSIS — R17 Unspecified jaundice: Secondary | ICD-10-CM | POA: Insufficient documentation

## 2023-02-19 DIAGNOSIS — N179 Acute kidney failure, unspecified: Principal | ICD-10-CM | POA: Insufficient documentation

## 2023-02-19 DIAGNOSIS — D500 Iron deficiency anemia secondary to blood loss (chronic): Secondary | ICD-10-CM | POA: Insufficient documentation

## 2023-02-19 DIAGNOSIS — E875 Hyperkalemia: Secondary | ICD-10-CM | POA: Insufficient documentation

## 2023-02-19 LAB — PROTIME-INR
INR: 1.3 — ABNORMAL HIGH (ref 0.8–1.2)
Prothrombin Time: 16.4 s — ABNORMAL HIGH (ref 11.4–15.2)

## 2023-02-19 LAB — HEPATITIS B SURFACE ANTIBODY,QUALITATIVE: Hep B S Ab: NONREACTIVE

## 2023-02-19 LAB — GLUCOSE, CAPILLARY
Glucose-Capillary: 139 mg/dL — ABNORMAL HIGH (ref 70–99)
Glucose-Capillary: 153 mg/dL — ABNORMAL HIGH (ref 70–99)
Glucose-Capillary: 149 mg/dL — ABNORMAL HIGH (ref 70–99)
Glucose-Capillary: 211 mg/dL — ABNORMAL HIGH (ref 70–99)
Glucose-Capillary: 203 mg/dL — ABNORMAL HIGH (ref 70–99)

## 2023-02-19 LAB — BASIC METABOLIC PANEL
Anion gap: 8 (ref 5–15)
BUN: 47 mg/dL — ABNORMAL HIGH (ref 8–23)
CO2: 22 mmol/L (ref 22–32)
Calcium: 8.1 mg/dL — ABNORMAL LOW (ref 8.9–10.3)
Chloride: 104 mmol/L (ref 98–111)
Creatinine, Ser: 2.04 mg/dL — ABNORMAL HIGH (ref 0.61–1.24)
GFR, Estimated: 34 mL/min — ABNORMAL LOW (ref >60)
Glucose, Bld: 162 mg/dL — ABNORMAL HIGH (ref 70–99)
Potassium: 4.7 mmol/L (ref 3.5–5.1)
Sodium: 134 mmol/L — ABNORMAL LOW (ref 135–145)

## 2023-02-19 LAB — CBC
HCT: 26.9 % — ABNORMAL LOW (ref 39.0–52.0)
Hemoglobin: 9.1 g/dL — ABNORMAL LOW (ref 13.0–17.0)
MCH: 29.8 pg (ref 26.0–34.0)
MCHC: 33.8 g/dL (ref 30.0–36.0)
MCV: 88.2 fL (ref 80.0–100.0)
Platelets: 182 10*3/uL (ref 150–400)
RBC: 3.05 MIL/uL — ABNORMAL LOW (ref 4.22–5.81)
RDW: 14.3 % (ref 11.5–15.5)
WBC: 12.1 10*3/uL — ABNORMAL HIGH (ref 4.0–10.5)
nRBC: 0 % (ref 0.0–0.2)

## 2023-02-19 LAB — HEPATIC FUNCTION PANEL
ALT: 455 U/L — ABNORMAL HIGH (ref 0–44)
AST: 511 U/L — ABNORMAL HIGH (ref 15–41)
Albumin: 2.8 g/dL — ABNORMAL LOW (ref 3.5–5.0)
Alkaline Phosphatase: 98 U/L (ref 38–126)
Bilirubin, Direct: 2.2 mg/dL — ABNORMAL HIGH (ref 0.0–0.2)
Indirect Bilirubin: 1.6 mg/dL — ABNORMAL HIGH (ref 0.3–0.9)
Total Bilirubin: 3.8 mg/dL — ABNORMAL HIGH (ref 0.3–1.2)
Total Protein: 5.2 g/dL — ABNORMAL LOW (ref 6.5–8.1)

## 2023-02-19 LAB — CBG MONITORING, ED: Glucose-Capillary: 206 mg/dL — ABNORMAL HIGH (ref 70–99)

## 2023-02-19 LAB — ACETAMINOPHEN LEVEL: Acetaminophen (Tylenol), Serum: <10 ug/mL — ABNORMAL LOW (ref 10–30)

## 2023-02-19 LAB — HEPATITIS B CORE ANTIBODY, TOTAL: Hep B Core Total Ab: NONREACTIVE

## 2023-02-19 LAB — POTASSIUM: Potassium: 5.6 mmol/L — ABNORMAL HIGH (ref 3.5–5.1)

## 2023-02-19 LAB — HEPATITIS B SURFACE ANTIGEN: Hepatitis B Surface Ag: NONREACTIVE

## 2023-02-19 LAB — ABO/RH: ABO/RH(D): A POS

## 2023-02-19 MED ORDER — SODIUM CHLORIDE 0.9 % IV BOLUS
500.0000 mL | Freq: Once | INTRAVENOUS | Status: AC
  Administered 2023-02-19: 500 mL via INTRAVENOUS

## 2023-02-19 NOTE — ED Notes (Signed)
I called 5n they are asking for 15 -20 mminute wait to transport

## 2023-02-19 NOTE — Plan of Care (Signed)
  Problem: Activity: Goal: Ability to tolerate increased activity will improve Outcome: Progressing   Problem: Nutritional: Goal: Maintenance of adequate nutrition will improve Outcome: Progressing Goal: Progress toward achieving an optimal weight will improve Outcome: Progressing

## 2023-02-19 NOTE — Consult Note (Signed)
Referring Provider: TH Primary Care Physician:  [name], MD Primary Gastroenterologist:  Dr. Dulce Sellar  Reason for Consultation: GI bleed, abnormal LFTs  HPI: Steven Mcclure is a 72 y.o. male with past medical history chronic kidney disease and other medical comorbidities mentioned below presented to the hospital with rectal bleeding. Underwent EGD and colonoscopy with Dr. Dulce Sellar on February 16, 2023. EGD  showed short segment Barrett's esophagus,gastritis and duodenitis. Colonoscopy showed fair prep and multiple small polyps along with diverticulosis and hemorrhoids.  Started having bright red blood per rectum yesterday.  Upon initial evaluation, he was found to have worsening of kidney disease with creatinine of 2.31, elevated LFTs with AST of 897, ALT 526, T. bili 4.1 and normal alkaline phosphatase.  Normal lipase.  CBC this morning showed drop in hemoglobin to 9.1.  INR 1.3.  LFTs improving.  [place] Right Upper Quadrant Showed Fatty Liver and Cholecystectomy Otherwise No Acute Changes.  Patient seen and examined at bedside.  He said he had a few episodes of bright red blood per rectum yesterday which has resolved now.  No further bowel movements.  He denied any associated abdominal pain.  Had some nausea but denied any vomiting.  Denies NSAID use. He was recently started on subcu injection 2 weeks ago for his arthritis by rheumatologist.  He does not know the name of the medication.  Past Medical History:  Diagnosis Date   Body mass index 40.0-44.9, adult (HCC)    CKD (chronic kidney disease), stage III (HCC)    Coronary atherosclerosis due to severely calcified coronary lesion    Diabetes mellitus without complication (HCC)    ED (erectile dysfunction)    Gout    History of kidney stones    Hx of colonic polyps    Hyperlipidemia    Hypertension    Long-term insulin use in type 2 diabetes (HCC)    Low testosterone in male    Obesity    Vertigo    Vitamin B12 deficiency      Past Surgical History:  Procedure Laterality Date   CHOLECYSTECTOMY N/A 11/04/2022   Procedure: LAPAROSCOPIC CHOLECYSTECTOMY WITH ICG DYE;  Surgeon: Berna Bue, MD;  Location: [facility] OR;  Service: General;  Laterality: N/A;   KIDNEY STONE SURGERY     LEFT HEART CATH AND CORONARY ANGIOGRAPHY N/A 12/24/2020   Procedure: LEFT HEART CATH AND CORONARY ANGIOGRAPHY;  Surgeon: Corky Crafts, MD;  Location: [facility] INVASIVE CV LAB;  Service: Cardiovascular;  Laterality: N/A;    Prior to Admission medications   Medication Sig Start Date End Date Taking? Authorizing Provider  acetaminophen (TYLENOL) 325 MG tablet Take 2 tablets (650 mg total) by mouth every 6 (six) hours as needed for headache or mild pain. 11/05/22  Yes Juliet Rude, PA-C  albuterol (VENTOLIN HFA) 108 (90 Base) MCG/ACT inhaler Inhale 1-2 puffs into the lungs every 6 (six) hours as needed for shortness of breath or wheezing. 06/25/18  Yes [provider]  allopurinol (ZYLOPRIM) 100 MG tablet TAKE 1 TABLET BY MOUTH TWICE A DAY Patient taking differently: Take 200 mg by mouth daily. 10/08/22  Yes Hyatt, Max T, DPM  aspirin EC 81 MG tablet Take 81 mg by mouth daily.   Yes [provider]  Cyanocobalamin (VITAMIN B-12 PO) Take 1 tablet by mouth daily.   Yes [provider]  ezetimibe (ZETIA) 10 MG tablet Take 10 mg by mouth every other day. 10/08/22  Yes [provider]  leflunomide (ARAVA) 20  MG tablet Take 20 mg by mouth daily. 08/15/22  Yes [provider]  loratadine (CLARITIN) 10 MG tablet Take 20 mg by mouth daily.   Yes [provider]  metFORMIN (GLUCOPHAGE) 1000 MG tablet Take 1 tablet (1,000 mg total) by mouth daily. 12/26/20  Yes Corky Crafts, MD  Multiple Vitamin (MULTIVITAMIN WITH MINERALS) TABS tablet Take 1 tablet by mouth daily.   Yes [provider]  Omega-3 Fatty Acids (FISH OIL) 1200 MG CAPS Take 1,200 mg by mouth daily.   Yes [provider]  pantoprazole (PROTONIX) 40 MG tablet Take 1 tablet (40 mg total) by mouth daily. 11/02/22 02/19/23 Yes Gloris Manchester, MD  rosuvastatin (CRESTOR) 20 MG tablet Take 1 tablet (20 mg total) by mouth daily. 04/03/22  Yes Jake Bathe, MD  TRULICITY 3 MG/0.5ML SOPN Inject 3 mg as directed every Friday. 07/28/20  Yes [provider]    Scheduled Meds:  insulin aspart  0-15 Units Subcutaneous TID WC   insulin detemir  70 Units Subcutaneous QHS   pantoprazole  40 mg Oral Daily   sodium zirconium cyclosilicate  10 g Oral TID   Continuous Infusions: PRN Meds:.  Allergies as of 02/18/2023 - Review Complete 02/18/2023  Allergen Reaction Noted   Crestor [rosuvastatin] Other (See Comments) 02/28/2021   Lipitor [atorvastatin] Other (See Comments) 08/24/2020   Hydrocod poli-chlorphe poli er Rash 08/24/2020   Tussin cough [dextromethorphan hbr] Rash 09/02/2016    Family History  Problem Relation Age of Onset   Diabetes Mother    Diabetes Father     Social History   Socioeconomic History   Marital status: Widowed    Spouse name: Not on file   Number of children: Not on file   Years of education: Not on file   Highest education level: Not on file  Occupational History   Not on file  Tobacco Use   Smoking status: Former    Current packs/day: 0.00    Types: Cigarettes    Quit date: 41    Years since quitting: 45.7   Smokeless tobacco: Never  Vaping Use   Vaping status: Never Used  Substance and Sexual Activity   Alcohol use: Yes    Comment: social   Drug use: Never   Sexual activity: Not on file  Other Topics Concern   Not on file  Social History Narrative   Not on file   Social Determinants of Health   Financial Resource Strain: Not on file  Food Insecurity: No Food Insecurity (11/02/2022)   Hunger Vital Sign    Worried About Running Out of Food in the Last Year: Never true    Ran Out of Food in the Last Year: Never true  Transportation Needs: No Transportation  Needs (11/02/2022)   PRAPARE - Administrator, Civil Service (Medical): No    Lack of Transportation (Non-Medical): No  Physical Activity: Not on file  Stress: Not on file  Social Connections: Not on file  Intimate Partner Violence: Not At Risk (11/02/2022)   Humiliation, Afraid, Rape, and Kick questionnaire    Fear of Current or Ex-Partner: No    Emotionally Abused: No    Physically Abused: No    Sexually Abused: No    Review of Systems: All negative except as stated above in HPI.  Physical Exam: Vital signs: Vitals:   02/19/23 0242 02/19/23 0739  BP: (!) 148/49 (!) 143/61  Pulse: 95 91  Resp: 18 18  Temp:  98.4 F (36.9 C) 98 F (36.7 C)  SpO2: 98% 97%   Last BM Date : 02/18/23 General:   Alert,  Well-developed, well-nourished, pleasant and cooperative in NAD HEENT-normocephalic, atraumatic, extraocular movement intact Lungs: No visible respiratory distress Heart:  Regular rate and rhythm; no murmurs, clicks, rubs,  or gallops. Abdomen: Soft, nontender, nondistended, bowel sound present, no peritoneal signs Alert and oriented x 3 Mood and affect normal Rectal:  Deferred  GI:  Lab Results: Recent Labs    02/18/23 1911 02/19/23 0442  WBC 16.3* 12.1*  HGB 11.5* 9.1*  HCT 35.8* 26.9*  PLT 235 182   BMET Recent Labs    02/18/23 1911 02/18/23 2231 02/19/23 0442  NA 131*  --  134*  K 5.7* 5.6* 4.7  CL 97*  --  104  CO2 22  --  22  GLUCOSE 311*  --  162*  BUN 46*  --  47*  CREATININE 2.31*  --  2.04*  CALCIUM 8.6*  --  8.1*   LFT Recent Labs    02/19/23 0442  PROT 5.2*  ALBUMIN 2.8*  AST 511*  ALT 455*  ALKPHOS 98  BILITOT 3.8*  BILIDIR 2.2*  IBILI 1.6*   PT/INR Recent Labs    02/18/23 2231 02/19/23 0442  LABPROT 15.9* 16.4*  INR 1.3* 1.3*     Studies/Results: US Abdomen Limited RUQ (LIVER/GB)  Result Date: 02/18/2023 CLINICAL DATA:  History of prior cholecystectomy, presenting with transaminitis. EXAM: ULTRASOUND ABDOMEN  LIMITED RIGHT UPPER QUADRANT COMPARISON:  Nov 03, 2022 FINDINGS: Gallbladder: The gallbladder is surgically absent. Common bile duct: Diameter: 5.1 Liver: No focal lesion identified. Diffusely increased echogenicity of the liver parenchyma is noted. Portal vein is patent on color Doppler imaging with normal direction of blood flow towards the liver. Other: None. IMPRESSION: 1. Findings consistent with history of prior cholecystectomy. 2. Hepatic steatosis. Electronically Signed   By: Aram Candela M.D.   On: 02/18/2023 22:56    Impression/Plan: -Rectal bleeding in setting of recent colonoscopy with polypectomy.  Most likely post polypectomy bleeding.  Rectal bleeding resolved now. -Elevated LFTs. ?  Likely transient.  He was recently started on subcu injection 2 weeks ago for his arthritis by rheumatologist.  Patient does not know the name of the medication. -Chronic kidney disease -History of coronary artery disease -Chronic leukocytosis.  Was seen by hematology.  Recommendations ------------------------ -Continue supportive care.  No further bleeding.  LFTs improving. -Check for hepatitis C antibody. -Patient will try to get the name for new subcu medication that was started for his arthritis 2 weeks ago. - soft diet. -GI will follow -D/W  hospitalist.    LOS: 1 day   Kathi Der  MD, FACP 02/19/2023, 9:40 AM  Contact #  [phone]

## 2023-02-19 NOTE — ED Notes (Signed)
The pt attempted to use the urinal and could not manage to use the urinal  he reported that he had tried to sit on the ned of the bed unsuccessfully

## 2023-02-19 NOTE — ED Notes (Signed)
The pt reports that he voided a large amount of urine when he was in  ultrasound earlier tonight

## 2023-02-19 NOTE — ED Notes (Signed)
ED TO INPATIENT HANDOFF REPORT  ED Nurse Name and Phone #: chris [phone]  S Name/Age/Gender Steven Mcclure Setting 72 y.o. male Room/Bed: 002C/002C  Code Status   Code Status: Full Code  Home/SNF/Other Home Patient oriented to: self, place, time, and situation Is this baseline? Yes   Triage Complete: Triage complete  Chief Complaint Lower GI bleed [K92.2]  Triage Note Pt arrives via POV. Pt reports having a colonoscopy on Monday. Pt states he has had rectal bleeding since yesterday. Pt denies any associated symptoms. Pt AxOx4.    Allergies Allergies  Allergen Reactions   Crestor [Rosuvastatin] Other (See Comments)    Myalgias Arthralgia  Pt currently taking 20mg  QD.   Lipitor [Atorvastatin] Other (See Comments)    Myalgias Arthralgia   Hydrocod Poli-Chlorphe Poli Er Rash   Tussin Cough [Dextromethorphan Hbr] Rash    Level of Care/Admitting Diagnosis ED Disposition     ED Disposition  Admit   Condition  --   Comment  Hospital Area: [facility] [[id number]]  Level of Care: Telemetry Medical [104]  May admit patient to [facility] or [facility] if equivalent level of care is available:: Yes  Covid Evaluation: Asymptomatic - no recent exposure (last 10 days) testing not required  Diagnosis: Lower GI bleed [725366]  Admitting Physician: Dolly Rias [[id number]]  Attending Physician: Dolly Rias [[id number]]  Certification:: I certify this patient will need inpatient services for at least 2 midnights  Expected Medical Readiness: 02/20/2023          B Medical/Surgery History Past Medical History:  Diagnosis Date   Body mass index 40.0-44.9, adult (HCC)    CKD (chronic kidney disease), stage III (HCC)    Coronary atherosclerosis due to severely calcified coronary lesion    Diabetes mellitus without complication (HCC)    ED (erectile dysfunction)    Gout    History of kidney stones    Hx of colonic polyps    Hyperlipidemia    Hypertension     Long-term insulin use in type 2 diabetes (HCC)    Low testosterone in male    Obesity    Vertigo    Vitamin B12 deficiency    Past Surgical History:  Procedure Laterality Date   CHOLECYSTECTOMY N/A 11/04/2022   Procedure: LAPAROSCOPIC CHOLECYSTECTOMY WITH ICG DYE;  Surgeon: Berna Bue, MD;  Location: [facility] OR;  Service: General;  Laterality: N/A;   KIDNEY STONE SURGERY     LEFT HEART CATH AND CORONARY ANGIOGRAPHY N/A 12/24/2020   Procedure: LEFT HEART CATH AND CORONARY ANGIOGRAPHY;  Surgeon: Corky Crafts, MD;  Location: [facility] INVASIVE CV LAB;  Service: Cardiovascular;  Laterality: N/A;     A IV Location/Drains/Wounds Patient Lines/Drains/Airways Status     Active Line/Drains/Airways     Name Placement date Placement time Site Days   Peripheral IV 02/18/23 20 G Left Forearm 02/18/23  2250  Forearm  1   Incision - 4 Ports Abdomen 1: Umbilicus 2: Mid;Upper 3: Right;Lateral 4: Right;Medial 11/04/22  1040  -- 107            Intake/Output Last 24 hours No intake or output data in the 24 hours ending 02/19/23 0141  Labs/Imaging Results for orders placed or performed during the hospital encounter of 02/18/23 (from the past 48 hour(s))  Type and screen [facility]     Status: None   Collection Time: 02/18/23  7:10 PM  Result Value Ref Range   ABO/RH(D) A POS  Antibody Screen NEG    Sample Expiration      02/21/2023,2359 Performed at [facility], [address] [place], [place] [address]   Comprehensive metabolic panel     Status: Abnormal   Collection Time: 02/18/23  7:11 PM  Result Value Ref Range   Sodium 131 (L) 135 - 145 mmol/L   Potassium 5.7 (H) 3.5 - 5.1 mmol/L   Chloride 97 (L) 98 - 111 mmol/L   CO2 22 22 - 32 mmol/L   Glucose, Bld 311 (H) 70 - 99 mg/dL    Comment: Glucose reference range applies only to samples taken after fasting for at least 8 hours.   BUN 46 (H) 8 - 23 mg/dL   Creatinine, Ser 1.32 (H) 0.61 - 1.24 mg/dL    Calcium 8.6 (L) 8.9 - 10.3 mg/dL   Total Protein 6.3 (L) 6.5 - 8.1 g/dL   Albumin 3.4 (L) 3.5 - 5.0 g/dL   AST 440 (H) 15 - 41 U/L   ALT 526 (H) 0 - 44 U/L   Alkaline Phosphatase 108 38 - 126 U/L   Total Bilirubin 4.1 (H) 0.3 - 1.2 mg/dL   GFR, Estimated 29 (L) >60 mL/min    Comment: (NOTE) Calculated using the CKD-EPI Creatinine Equation (2021)    Anion gap 12 5 - 15    Comment: Performed at [facility], [address] [place], [place] [address]  CBC     Status: Abnormal   Collection Time: 02/18/23  7:11 PM  Result Value Ref Range   WBC 16.3 (H) 4.0 - 10.5 K/uL   RBC 3.97 (L) 4.22 - 5.81 MIL/uL   Hemoglobin 11.5 (L) 13.0 - 17.0 g/dL   HCT 53.6 (L) 64.4 - 03.4 %   MCV 90.2 80.0 - 100.0 fL   MCH 29.0 26.0 - 34.0 pg   MCHC 32.1 30.0 - 36.0 g/dL   RDW 74.2 59.5 - 63.8 %   Platelets 235 150 - 400 K/uL   nRBC 0.0 0.0 - 0.2 %    Comment: Performed at [facility], [address] [place], [place] [address]  Protime-INR     Status: Abnormal   Collection Time: 02/18/23 10:31 PM  Result Value Ref Range   Prothrombin Time 15.9 (H) 11.4 - 15.2 seconds   INR 1.3 (H) 0.8 - 1.2    Comment: (NOTE) INR goal varies based on device and disease states. Performed at [facility], [address] [place], [place] [address]   APTT     Status: None   Collection Time: 02/18/23 10:31 PM  Result Value Ref Range   aPTT 28 24 - 36 seconds    Comment: Performed at [facility], [address] [place], [place] [address]  Lipase, blood     Status: None   Collection Time: 02/18/23 10:31 PM  Result Value Ref Range   Lipase 30 11 - 51 U/L    Comment: Performed at [facility], [address] [place], [place] [address]  Acetaminophen level     Status: Abnormal   Collection Time: 02/18/23 10:31 PM  Result Value Ref Range   Acetaminophen (Tylenol), Serum <10 (L) 10 - 30 ug/mL    Comment: (NOTE) Therapeutic concentrations vary significantly. A range of 10-30 ug/mL   may be an effective concentration for many patients. However, some  are best treated at concentrations outside of this range. Acetaminophen concentrations >150 ug/mL at 4 hours  after ingestion  and >50 ug/mL at 12 hours after ingestion are often associated with  toxic reactions.  Performed at [facility], [address], [place], [place] [address]   Potassium     Status: Abnormal   Collection Time: 02/18/23 10:31 PM  Result Value Ref Range   Potassium 5.6 (H) 3.5 - 5.1 mmol/L    Comment: Performed at [facility], [address], [place], [place] [address]  CBG monitoring, ED     Status: Abnormal   Collection Time: 02/19/23 12:33 AM  Result Value Ref Range   Glucose-Capillary 206 (H) 70 - 99 mg/dL    Comment: Glucose reference range applies only to samples taken after fasting for at least 8 hours.   US Abdomen Limited RUQ (LIVER/GB)  Result Date: 02/18/2023 CLINICAL DATA:  History of prior cholecystectomy, presenting with transaminitis. EXAM: ULTRASOUND ABDOMEN LIMITED RIGHT UPPER QUADRANT COMPARISON:  Nov 03, 2022 FINDINGS: Gallbladder: The gallbladder is surgically absent. Common bile duct: Diameter: 5.1 Liver: No focal lesion identified. Diffusely increased echogenicity of the liver parenchyma is noted. Portal vein is patent on color Doppler imaging with normal direction of blood flow towards the liver. Other: None. IMPRESSION: 1. Findings consistent with history of prior cholecystectomy. 2. Hepatic steatosis. Electronically Signed   By: Aram Candela M.D.   On: 02/18/2023 22:56    Pending Labs Unresulted Labs (From admission, onward)     Start     Ordered   02/19/23 0500  CBC  Tomorrow morning,   R        02/18/23 2339   02/19/23 0500  Protime-INR  Tomorrow morning,   R        02/18/23 2339   02/19/23 0500  Hepatic function panel  Tomorrow morning,   R        02/18/23 2339   02/19/23 0500  Basic metabolic panel  Tomorrow morning,   R        02/18/23 2339    02/19/23 0500  Hemoglobin A1c  Tomorrow morning,   R       Comments: To assess prior glycemic control    02/18/23 2345   02/18/23 2311  Potassium  STAT Now then every 4 hours ,   R (with STAT occurrences)     Comments: Until normal twice.    02/18/23 2313            Vitals/Pain Today's Vitals   02/19/23 0000 02/19/23 0014 02/19/23 0015 02/19/23 0045  BP: (!) 130/56  (!) 130/57 114/67  Pulse: 94  86 82  Resp: 19  (!) 25 18  Temp:  99.2 F (37.3 C)    TempSrc:      SpO2: 95% 100% 95% 98%  PainSc:        Isolation Precautions No active isolations  Medications Medications  sodium zirconium cyclosilicate (LOKELMA) packet 10 g (10 g Oral Given 02/19/23 0044)  insulin detemir (LEVEMIR) injection 70 Units (70 Units Subcutaneous Not Given 02/19/23 0102)  insulin aspart (novoLOG) injection 0-15 Units (has no administration in time range)  pantoprazole (PROTONIX) EC tablet 40 mg (has no administration in time range)  calcium gluconate 1 g/ 50 mL sodium chloride IVPB (0 mg Intravenous Stopped 02/18/23 2352)  sodium chloride 0.9 % bolus 500 mL (0 mLs Intravenous Stopped 02/18/23 2352)  insulin aspart (novoLOG) injection 5 Units (5 Units Intravenous Given 02/19/23 0059)  sodium chloride 0.9 % bolus 500 mL (500 mLs Intravenous New Bag/Given 02/19/23 0043)  Mobility walks     Focused Assessments Cardiac Assessment Handoff:    Lab Results  Component Value Date   CKTOTAL 75 12/21/2009   CKMB 0.9 12/21/2009   TROPONINI <0.01        NO INDICATION OF MYOCARDIAL INJURY. 12/21/2009   Lab Results  Component Value Date   DDIMER (H) 12/21/2009    3.76        AT THE INHOUSE ESTABLISHED CUTOFF VALUE OF 0.48 ug/mL FEU, THIS ASSAY HAS BEEN DOCUMENTED IN THE LITERATURE TO HAVE A SENSITIVITY AND NEGATIVE PREDICTIVE VALUE OF AT LEAST 98 TO 99%.  THE TEST RESULT SHOULD BE CORRELATED WITH AN ASSESSMENT OF THE CLINICAL PROBABILITY OF DVT / VTE.   Does the Patient currently have  chest pain? No    R Recommendations: See Admitting Provider Note  Report given to:   Additional Notes:

## 2023-02-19 NOTE — Progress Notes (Signed)
Pt. arrived to unit alert and oriented no c/o pain VSS. Bed in lowest position call bell within reach.

## 2023-02-19 NOTE — Progress Notes (Signed)
Mobility Specialist: Progress Note   02/19/23 1020  Mobility  Activity Ambulated with assistance in hallway  Level of Assistance Contact guard assist, steadying assist  Assistive Device Other (Comment) (occasional use of handrails)  Distance Ambulated (ft) 300 ft  Activity Response Tolerated well  Mobility Referral Yes  $Mobility charge 1 Mobility  Mobility Specialist Start Time (ACUTE ONLY) [id number]  Mobility Specialist Stop Time (ACUTE ONLY) 0950  Mobility Specialist Time Calculation (min) (ACUTE ONLY) 7 min    Pt was agreeable to mobility session - received in bed. No complaints throughout. MS observed slight unsteady gait; pt was able to self-correct by grabbing onto handrails when unsteadiness or slight LOB occurred. Returned to room without fault. Left ambulating in bathroom with all needs met, call bell in reach.   Maurene Capes Mobility Specialist Please contact via SecureChat or Rehab office at [phone]

## 2023-02-19 NOTE — Progress Notes (Signed)
PROGRESS NOTE  Steven Mcclure NWG:[id number] DOB: [birth year] DOA: 02/18/2023 PCP: [name], MD   LOS: 1 day   Brief Narrative / Interim history: 72 year old male with HTN, CKD 3B, DM 2, CAD, recent cholecystectomy earlier this year in the setting of gangrenous cholecystitis, recent colonoscopy 9/9 for routine screening with polypectomy, comes into the hospital with hematochezia.  He reported some nausea the day prior to admission but nothing right now  Subjective / 24h Interval events: He is doing well this morning, denies any abdominal pain, no nausea, no vomiting.  Assesement and Plan: Principal Problem:   Lower GI bleed Active Problems:   Anemia, blood loss   AKI (acute kidney injury) (HCC)   Hyperkalemia   Elevated bilirubin   Principal problem Hematochezia, acute blood loss anemia- status post recent colonoscopy on 9/9 with polypectomy.  GI consulted, discussed with Dr. Levora Angel, appreciate input -Hemoglobin dropped about 2 points from 11.5-9.1, but he also received fluids and may be a dilutional component given that white count and platelets also dropped  Active problems Acute kidney injury on CKD stage IIIb -creatinine gradually improving, 2.0 this morning.  Acute liver injury -with significant elevation of AST/ALT/bilirubin.  Discussed with GI, possibly related to his blood loss and potential hypotension at home.  Right upper quadrant ultrasound fairly unremarkable.  LFTs improving today  Hyperkalemia-receiving Lokelma, potassium improved  Leukocytosis -improved  CAD -noted, no chest pain  Hyperlipidemia -hold statin  Hyponatremia-mild  Scheduled Meds:  insulin aspart  0-15 Units Subcutaneous TID WC   insulin detemir  70 Units Subcutaneous QHS   pantoprazole  40 mg Oral Daily   sodium zirconium cyclosilicate  10 g Oral TID   Continuous Infusions: PRN Meds:.  Current Outpatient Medications  Medication Instructions   acetaminophen (TYLENOL) 650  mg, Oral, Every 6 hours PRN   albuterol (VENTOLIN HFA) 108 (90 Base) MCG/ACT inhaler 1-2 puffs, Inhalation, Every 6 hours PRN   allopurinol (ZYLOPRIM) 100 MG tablet TAKE 1 TABLET BY MOUTH TWICE A DAY   aspirin EC 81 mg, Oral, Daily   Cyanocobalamin (VITAMIN B-12 PO) 1 tablet, Oral, Daily   ezetimibe (ZETIA) 10 mg, Oral, Every other day   Fish Oil 1,200 mg, Oral, Daily   leflunomide (ARAVA) 20 mg, Oral, Daily   loratadine (CLARITIN) 20 mg, Oral, Daily   metFORMIN (GLUCOPHAGE) 1,000 mg, Oral, Daily   Multiple Vitamin (MULTIVITAMIN WITH MINERALS) TABS tablet 1 tablet, Oral, Daily   pantoprazole (PROTONIX) 40 mg, Oral, Daily   rosuvastatin (CRESTOR) 20 mg, Oral, Daily   Trulicity 3 mg, Injection, Every Fri    Diet Orders (From admission, onward)     Start     Ordered   02/19/23 1033  DIET SOFT Room service appropriate? Yes; Fluid consistency: Thin  Diet effective now       Question Answer Comment  Room service appropriate? Yes   Fluid consistency: Thin      02/19/23 1032            DVT prophylaxis: SCDs Start: 02/18/23 2337   Lab Results  Component Value Date   PLT 182 02/19/2023      Code Status: Full Code  Family Communication: no family at bedside   Status is: Inpatient  Remains inpatient appropriate because: elevated LFTs  Level of care: Telemetry Medical  Consultants:  GI  Objective: Vitals:   02/19/23 0015 02/19/23 0045 02/19/23 0242 02/19/23 0739  BP: (!) 130/57 114/67 (!) 148/49 (!) 143/61  Pulse: 86 82 95  91  Resp: (!) 25 18 18 18   Temp:   98.4 F (36.9 C) 98 F (36.7 C)  TempSrc:   Oral Oral  SpO2: 95% 98% 98% 97%    Intake/Output Summary (Last 24 hours) at 02/19/2023 1149 Last data filed at 02/19/2023 0815 Gross per 24 hour  Intake 740 ml  Output 400 ml  Net 340 ml   Wt Readings from Last 3 Encounters:  12/17/22 120.9 kg  11/02/22 117.5 kg  04/03/22 117.5 kg    Examination:  Constitutional: NAD Eyes: +  scleral icterus ENMT:  Mucous membranes are moist.  Neck: normal, supple Respiratory: clear to auscultation bilaterally, no wheezing, no crackles.  Cardiovascular: Regular rate and rhythm, no murmurs / rubs / gallops. No LE edema.  Abdomen: non distended, no tenderness. Bowel sounds positive.  Musculoskeletal: no clubbing / cyanosis.   Data Reviewed: I have independently reviewed following labs and imaging studies   CBC Recent Labs  Lab 02/18/23 1911 02/19/23 0442  WBC 16.3* 12.1*  HGB 11.5* 9.1*  HCT 35.8* 26.9*  PLT 235 182  MCV 90.2 88.2  MCH 29.0 29.8  MCHC 32.1 33.8  RDW 14.2 14.3    Recent Labs  Lab 02/18/23 1911 02/18/23 2231 02/19/23 0442  NA 131*  --  134*  K 5.7* 5.6* 4.7  CL 97*  --  104  CO2 22  --  22  GLUCOSE 311*  --  162*  BUN 46*  --  47*  CREATININE 2.31*  --  2.04*  CALCIUM 8.6*  --  8.1*  AST 897*  --  511*  ALT 526*  --  455*  ALKPHOS 108  --  98  BILITOT 4.1*  --  3.8*  ALBUMIN 3.4*  --  2.8*  INR  --  1.3* 1.3*    ------------------------------------------------------------------------------------------------------------------ No results for input(s): "CHOL", "HDL", "LDLCALC", "TRIG", "CHOLHDL", "LDLDIRECT" in the last 72 hours.  No results found for: "HGBA1C" ------------------------------------------------------------------------------------------------------------------ No results for input(s): "TSH", "T4TOTAL", "T3FREE", "THYROIDAB" in the last 72 hours.  Invalid input(s): "FREET3"  Cardiac Enzymes No results for input(s): "CKMB", "TROPONINI", "MYOGLOBIN" in the last 168 hours.  Invalid input(s): "CK" ------------------------------------------------------------------------------------------------------------------ No results found for: "BNP"  CBG: Recent Labs  Lab 02/19/23 0033 02/19/23 0359 02/19/23 0736 02/19/23 1128  GLUCAP 206* 139* 153* 149*    No results found for this or any previous visit (from the past 240 hour(s)).   Radiology  Studies: US Abdomen Limited RUQ (LIVER/GB)  Result Date: 02/18/2023 CLINICAL DATA:  History of prior cholecystectomy, presenting with transaminitis. EXAM: ULTRASOUND ABDOMEN LIMITED RIGHT UPPER QUADRANT COMPARISON:  Nov 03, 2022 FINDINGS: Gallbladder: The gallbladder is surgically absent. Common bile duct: Diameter: 5.1 Liver: No focal lesion identified. Diffusely increased echogenicity of the liver parenchyma is noted. Portal vein is patent on color Doppler imaging with normal direction of blood flow towards the liver. Other: None. IMPRESSION: 1. Findings consistent with history of prior cholecystectomy. 2. Hepatic steatosis. Electronically Signed   By: Aram Candela M.D.   On: 02/18/2023 22:56     Pamella Pert, MD, PhD [facility]  Between 7 am - 7 pm I am available, please contact me via Amion (for emergencies) or Securechat (non urgent messages)  Between 7 pm - 7 am I am not available, please contact night coverage MD/APP via Amion

## 2023-02-19 NOTE — H&P (Signed)
History and Physical    Patient: Steven Mcclure NWG:[id number] DOB: 06-Jul-1950 DOA: 02/18/2023 DOS: the patient was seen and examined on 02/19/2023 PCP: [name], MD  Patient coming from: Home  Chief Complaint:  Chief Complaint  Patient presents with   Blood In Stools   HPI: Steven Mcclure is a 72 y.o. male with hx of hypertension, hyperlipidemia, type 2 diabetes, CKD stage IIIb, gout, mild obstructive coronary artery disease, hx of gangrenous cholecystitis s/p cholecystectomy in 5/'24, recent colonoscopy on 9/9 for routine screening, who presents with acute onset of hematochezia. Reports generally well until acute onset 3 episodes of large volume hematochezia today. Otherwise has had recent subjective fever and sweats x 2 days without other associated localizing symptoms. Denies worsening abd pain, post-prandial abd pain, cough / cold, cp, sob, dysuria, rashes. He acknowledges recent decreased PO intake and increased losses associated with bowel prep and dietary restrictions following. He otherwise is not having N/V other losses ongoing. Has malaise.      Review of Systems: As mentioned in the history of present illness. All other systems reviewed and are negative. Past Medical History:  Diagnosis Date   Body mass index 40.0-44.9, adult (HCC)    CKD (chronic kidney disease), stage III (HCC)    Coronary atherosclerosis due to severely calcified coronary lesion    Diabetes mellitus without complication (HCC)    ED (erectile dysfunction)    Gout    History of kidney stones    Hx of colonic polyps    Hyperlipidemia    Hypertension    Long-term insulin use in type 2 diabetes (HCC)    Low testosterone in male    Obesity    Vertigo    Vitamin B12 deficiency    Past Surgical History:  Procedure Laterality Date   CHOLECYSTECTOMY N/A 11/04/2022   Procedure: LAPAROSCOPIC CHOLECYSTECTOMY WITH ICG DYE;  Surgeon: Berna Bue, MD;  Location: [facility] OR;  Service: General;   Laterality: N/A;   KIDNEY STONE SURGERY     LEFT HEART CATH AND CORONARY ANGIOGRAPHY N/A 12/24/2020   Procedure: LEFT HEART CATH AND CORONARY ANGIOGRAPHY;  Surgeon: Corky Crafts, MD;  Location: [facility] INVASIVE [facility];  Service: Cardiovascular;  Laterality: N/A;   Social History:  reports that he quit smoking about 45 years ago. His smoking use included cigarettes. He has never used smokeless tobacco. He reports current alcohol use. He reports that he does not use drugs.  Allergies  Allergen Reactions   Crestor [Rosuvastatin] Other (See Comments)    Myalgias Arthralgia  Pt currently taking 20mg  QD.   Lipitor [Atorvastatin] Other (See Comments)    Myalgias Arthralgia   Hydrocod Poli-Chlorphe Poli Er Rash   Tussin Cough [Dextromethorphan Hbr] Rash    Family History  Problem Relation Age of Onset   Diabetes Mother    Diabetes Father     Prior to Admission medications   Medication Sig Start Date End Date Taking? Authorizing Provider  acetaminophen (TYLENOL) 325 MG tablet Take 2 tablets (650 mg total) by mouth every 6 (six) hours as needed for headache or mild pain. 11/05/22  Yes Juliet Rude, PA-C  albuterol (VENTOLIN HFA) 108 (90 Base) MCG/ACT inhaler Inhale 1-2 puffs into the lungs every 6 (six) hours as needed for shortness of breath or wheezing. 06/25/18  Yes [provider]  allopurinol (ZYLOPRIM) 100 MG tablet TAKE 1 TABLET BY MOUTH TWICE A DAY Patient taking differently: Take 200 mg by mouth daily. 10/08/22  Yes Hyatt,  Max T, DPM  aspirin EC 81 MG tablet Take 81 mg by mouth daily.   Yes [provider]  Cyanocobalamin (VITAMIN B-12 PO) Take 1 tablet by mouth daily.   Yes [provider]  ezetimibe (ZETIA) 10 MG tablet Take 10 mg by mouth every other day. 10/08/22  Yes [provider]  leflunomide (ARAVA) 20 MG tablet Take 20 mg by mouth daily. 08/15/22  Yes [provider]  loratadine (CLARITIN) 10 MG tablet Take 20 mg by mouth daily.    Yes [provider]  metFORMIN (GLUCOPHAGE) 1000 MG tablet Take 1 tablet (1,000 mg total) by mouth daily. 12/26/20  Yes Corky Crafts, MD  Multiple Vitamin (MULTIVITAMIN WITH MINERALS) TABS tablet Take 1 tablet by mouth daily.   Yes [provider]  Omega-3 Fatty Acids (FISH OIL) 1200 MG CAPS Take 1,200 mg by mouth daily.   Yes [provider]  pantoprazole (PROTONIX) 40 MG tablet Take 1 tablet (40 mg total) by mouth daily. 11/02/22 02/19/23 Yes Gloris Manchester, MD  rosuvastatin (CRESTOR) 20 MG tablet Take 1 tablet (20 mg total) by mouth daily. 04/03/22  Yes Jake Bathe, MD  TRULICITY 3 MG/0.5ML SOPN Inject 3 mg as directed every Friday. 07/28/20  Yes [provider]    Physical Exam: Vitals:   02/19/23 0000 02/19/23 0014 02/19/23 0015 02/19/23 0045  BP: (!) 130/56  (!) 130/57 114/67  Pulse: 94  86 82  Resp: 19  (!) 25 18  Temp:  99.2 F (37.3 C)    TempSrc:      SpO2: 95% 100% 95% 98%     Gen: Awake, alert, NAD  CV: Regular, normal S1, S2, no murmurs  Resp: Normal WOB, CTAB  Abd: Flat, hyperactive, minimal LLQ tenderness MSK: Symmetric, no edema  Skin: No rashes or lesions to exposed skin, no scleral icterus or sublingual jaundice  Neuro: Alert and interactive  Psych: euthymic, appropriate    Data Reviewed:   Reviewed, notable for  Cr 2.3 (b/l 1.3)  K 5.7 Glucose 311  T bili 4.1  AST 897, ALT 526  WBC 16 Hb 11 (down from 13)  INR 1.3   Imaging: reviewed RUQ [place]  CLINICAL DATA:  History of prior cholecystectomy, presenting with transaminitis.   EXAM: ULTRASOUND ABDOMEN LIMITED RIGHT UPPER QUADRANT   COMPARISON:  Nov 03, 2022   FINDINGS: Gallbladder:   The gallbladder is surgically absent.   Common bile duct:   Diameter: 5.1   Liver:   No focal lesion identified. Diffusely increased echogenicity of the liver parenchyma is noted. Portal vein is patent on color Doppler imaging with normal direction of blood flow  towards the liver.   Other: None.   IMPRESSION: 1. Findings consistent with history of prior cholecystectomy. 2. Hepatic steatosis.     Electronically Signed   By: Aram Candela M.D.   On: 02/18/2023 22:56    Assessment and Plan:  34 M with hypertension, hyperlipidemia, type 2 diabetes, CKD stage IIIb, gout, mild obstructive coronary artery disease, hx of gangrenous cholecystitis s/p cholecystectomy in 5/'24, recent colonoscopy on 9/9 for routine screening, who presents with acute onset of hematochezia x 1 day. Admission notable for AKI stage II with associated hyperkalemia, acute liver injury with mixed pattern.   Hematochezia, suspect LGIB associated with recent colonoscopy with polypectomy  Acute blood loss anemia  1 day of hematochezia, recent colonoscopy 9/9 with polypectomy for routine screening. Likely polypectomy site as source of LGIB. Hb 13 ->  11 on admission. Hemodynamically stable, not on anticoagulant therapy. Taking aspirin for secondary prevention with hx CAD, no hx MI or stents.  - GI consult, ED discussed with [facility] providers who will see patient in AM  - NPO after midnight in case of rebleeding or need for endoscopy  - Hold aspirin for now, may resume if bleeding has stopped   Acute kidney injury stage II, suspected prerenal  background CKD stage III  Hyperkalemia, with EKG changes  Baseline Cr 1.3, elevated to 2.6 on admission. Suspected prerenal with hx recent losses during colonoscopy, decreased PO following, and blood loss. Associated with Hyperkalemia at 5.7. EKG notable for mild peaked T waves.  - Check PVR to r/o retention  - s/p 500 cc by ED, give additional 1L IVF  - Temporize with insulin, serial K until normalized  - Start Lokelma 10 mg TID x 48 hr - Trend renal function   Acute liver injury, mixed pattern  Hx recent cholecystectomy 5/'24  LFT normal during prior hospitalization, mixed pattern here with T bili 4.1, AST 897, ALT 526. RUQ [place] with  no CBD dilation post cholecystectomy, 5.1 mm. Steatosis seen. Suspect post-operative elevation in LFT, may consider less likely delayed hemobilia with transient obstruction in setting of GI bleeding.  - Trend LFT  - If persistent, or worsening evaluation can consider MRCP  - Check Hep A, B, C serology   DM type 2, with uncontrolled hyperglycemia  Reportedly on clinical trial for insulin management, unknown basal insulin at home (reportedly 122 units in the morning), previously on Lantus 90 units daily.  - Reduced dose of Detemir 70 units nightly (not taken AM before admission); this is approx 20% reduction from most recent lantus dose  - SSI aspart  - hold home oral agents, trulicity   Leukocytosis  WBC 16, without other systemic signs of infection  - Trend CBC  Chronic medical problems:   CAD: see above, hold aspirin for now   HLD: Hold Rosuvastatin and Zetia in setting of liver injury   Immunosuppressant mgmt: On Humira and Leflunomide outpatient. Hold Leflunomide in setting of liver injury.   Gout: Reduced dose allopurinol     Advance Care Planning:   Code Status: Full Code ; Confirmed with patient   Consults: GI - [facility]   Family Communication: No  Severity of Illness: The appropriate patient status for this patient is INPATIENT. Inpatient status is judged to be reasonable and necessary in order to provide the required intensity of service to ensure the patient's safety. The patient's presenting symptoms, physical exam findings, and initial radiographic and laboratory data in the context of their chronic comorbidities is felt to place them at high risk for further clinical deterioration. Furthermore, it is not anticipated that the patient will be medically stable for discharge from the hospital within 2 midnights of admission.   * I certify that at the point of admission it is my clinical judgment that the patient will require inpatient hospital care spanning beyond 2 midnights  from the point of admission due to high intensity of service, high risk for further deterioration and high frequency of surveillance required.*  Author: Dolly Rias, MD 02/19/2023 2:02 AM  For on call review [email or web address].

## 2023-02-20 DIAGNOSIS — D122 Benign neoplasm of ascending colon: Secondary | ICD-10-CM | POA: Diagnosis not present

## 2023-02-20 DIAGNOSIS — D120 Benign neoplasm of cecum: Secondary | ICD-10-CM | POA: Diagnosis not present

## 2023-02-20 DIAGNOSIS — K2940 Chronic atrophic gastritis without bleeding: Secondary | ICD-10-CM | POA: Diagnosis not present

## 2023-02-20 DIAGNOSIS — D124 Benign neoplasm of descending colon: Secondary | ICD-10-CM | POA: Diagnosis not present

## 2023-02-20 DIAGNOSIS — K922 Gastrointestinal hemorrhage, unspecified: Secondary | ICD-10-CM | POA: Diagnosis not present

## 2023-02-20 DIAGNOSIS — B9681 Helicobacter pylori [H. pylori] as the cause of diseases classified elsewhere: Secondary | ICD-10-CM | POA: Diagnosis not present

## 2023-02-20 DIAGNOSIS — K2270 Barrett's esophagus without dysplasia: Secondary | ICD-10-CM | POA: Diagnosis not present

## 2023-02-20 DIAGNOSIS — D123 Benign neoplasm of transverse colon: Secondary | ICD-10-CM | POA: Diagnosis not present

## 2023-02-20 DIAGNOSIS — K2980 Duodenitis without bleeding: Secondary | ICD-10-CM | POA: Diagnosis not present

## 2023-02-20 LAB — CBC
HCT: 24.9 % — ABNORMAL LOW (ref 39.0–52.0)
Hemoglobin: 8.4 g/dL — ABNORMAL LOW (ref 13.0–17.0)
MCH: 30.3 pg (ref 26.0–34.0)
MCHC: 33.7 g/dL (ref 30.0–36.0)
MCV: 89.9 fL (ref 80.0–100.0)
Platelets: 160 10*3/uL (ref 150–400)
RBC: 2.77 MIL/uL — ABNORMAL LOW (ref 4.22–5.81)
RDW: 14.1 % (ref 11.5–15.5)
WBC: 9 10*3/uL (ref 4.0–10.5)
nRBC: 0 % (ref 0.0–0.2)

## 2023-02-20 LAB — COMPREHENSIVE METABOLIC PANEL
ALT: 279 U/L — ABNORMAL HIGH (ref 0–44)
AST: 185 U/L — ABNORMAL HIGH (ref 15–41)
Albumin: 2.6 g/dL — ABNORMAL LOW (ref 3.5–5.0)
Alkaline Phosphatase: 88 U/L (ref 38–126)
Anion gap: 6 (ref 5–15)
BUN: 32 mg/dL — ABNORMAL HIGH (ref 8–23)
CO2: 26 mmol/L (ref 22–32)
Calcium: 8 mg/dL — ABNORMAL LOW (ref 8.9–10.3)
Chloride: 103 mmol/L (ref 98–111)
Creatinine, Ser: 1.6 mg/dL — ABNORMAL HIGH (ref 0.61–1.24)
GFR, Estimated: 45 mL/min — ABNORMAL LOW (ref >60)
Glucose, Bld: 163 mg/dL — ABNORMAL HIGH (ref 70–99)
Potassium: 4 mmol/L (ref 3.5–5.1)
Sodium: 135 mmol/L (ref 135–145)
Total Bilirubin: 1 mg/dL (ref 0.3–1.2)
Total Protein: 5.3 g/dL — ABNORMAL LOW (ref 6.5–8.1)

## 2023-02-20 LAB — MAGNESIUM: Magnesium: 1.7 mg/dL (ref 1.7–2.4)

## 2023-02-20 LAB — GLUCOSE, CAPILLARY: Glucose-Capillary: 142 mg/dL — ABNORMAL HIGH (ref 70–99)

## 2023-02-20 LAB — PROTIME-INR
INR: 1.1 (ref 0.8–1.2)
Prothrombin Time: 14.2 s (ref 11.4–15.2)

## 2023-02-20 LAB — HEMOGLOBIN A1C
Hgb A1c MFr Bld: 7.6 % — ABNORMAL HIGH (ref 4.8–5.6)
Mean Plasma Glucose: 171 mg/dL

## 2023-02-20 LAB — HCV AB W REFLEX TO QUANT PCR: HCV Ab: NONREACTIVE

## 2023-02-20 LAB — HCV INTERPRETATION

## 2023-02-20 NOTE — Discharge Summary (Signed)
Physician Discharge Summary  Steven Mcclure BJY:[id number] DOB: 04/17/1951 DOA: 02/18/2023  PCP: [name], MD  Admit date: 02/18/2023 Discharge date: 02/20/2023  Admitted From: home Disposition:  home  Recommendations for Outpatient Follow-up:  Follow up with PCP in 1-2 weeks  Home Health: none Equipment/Devices: none  Discharge Condition: stable CODE STATUS: full code   HPI: Per admitting MD, Steven Mcclure is a 72 y.o. male with hx of hypertension, hyperlipidemia, type 2 diabetes, CKD stage IIIb, gout, mild obstructive coronary artery disease, hx of gangrenous cholecystitis s/p cholecystectomy in 5/'24, recent colonoscopy on 9/9 for routine screening, who presents with acute onset of hematochezia. Reports generally well until acute onset 3 episodes of large volume hematochezia today. Otherwise has had recent subjective fever and sweats x 2 days without other associated localizing symptoms. Denies worsening abd pain, post-prandial abd pain, cough / cold, cp, sob, dysuria, rashes. He acknowledges recent decreased PO intake and increased losses associated with bowel prep and dietary restrictions following. He otherwise is not having N/V other losses ongoing. Has malaise.   Hospital Course / Discharge diagnoses: Principal Problem:   Lower GI bleed Active Problems:   Anemia, blood loss   AKI (acute kidney injury) (HCC)   Hyperkalemia   Elevated bilirubin   Principal problem Hematochezia, acute blood loss anemia- status post recent colonoscopy on 9/9 with polypectomy.  GI consulted, discussed with Dr. Levora Angel, appreciate input, this is likely post polypectomy bleed.  Hemoglobin did drop but has remained stable.  Clinically he is without further bleeding, discussed with GI, appears resolved and will be discharged home in stable condition   Active problems Acute kidney injury on CKD stage IIIb -creatinine returned to baseline  Acute liver injury -with significant  elevation of AST/ALT/bilirubin.  Discussed with GI, possibly related to his blood loss and potential hypotension at home.  Right upper quadrant ultrasound fairly unremarkable.  LFTs improving  Hyperkalemia-receiving Lokelma, potassium improved Leukocytosis -improved CAD -noted, no chest pain Hyperlipidemia -resume home medications Hyponatremia-mild  Sepsis ruled out   Discharge Instructions   Allergies as of 02/20/2023       Reactions   Crestor [rosuvastatin] Other (See Comments)   Myalgias Arthralgia Pt currently taking 20mg  QD.   Lipitor [atorvastatin] Other (See Comments)   Myalgias Arthralgia   Hydrocod Poli-chlorphe Poli Er Rash   Tussin Cough [dextromethorphan Hbr] Rash        Medication List     TAKE these medications    acetaminophen 325 MG tablet Commonly known as: TYLENOL Take 2 tablets (650 mg total) by mouth every 6 (six) hours as needed for headache or mild pain.   albuterol 108 (90 Base) MCG/ACT inhaler Commonly known as: VENTOLIN HFA Inhale 1-2 puffs into the lungs every 6 (six) hours as needed for shortness of breath or wheezing.   allopurinol 100 MG tablet Commonly known as: ZYLOPRIM TAKE 1 TABLET BY MOUTH TWICE A DAY What changed:  how much to take when to take this   aspirin EC 81 MG tablet Take 81 mg by mouth daily.   ezetimibe 10 MG tablet Commonly known as: ZETIA Take 10 mg by mouth every other day.   Fish Oil 1200 MG Caps Take 1,200 mg by mouth daily.   leflunomide 20 MG tablet Commonly known as: ARAVA Take 20 mg by mouth daily.   loratadine 10 MG tablet Commonly known as: CLARITIN Take 20 mg by mouth daily.   metFORMIN 1000 MG tablet Commonly known as: GLUCOPHAGE Take 1  tablet (1,000 mg total) by mouth daily.   multivitamin with minerals Tabs tablet Take 1 tablet by mouth daily.   pantoprazole 40 MG tablet Commonly known as: Protonix Take 1 tablet (40 mg total) by mouth daily.   rosuvastatin 20 MG tablet Commonly  known as: CRESTOR Take 1 tablet (20 mg total) by mouth daily.   Trulicity 3 MG/0.5ML Sopn Generic drug: Dulaglutide Inject 3 mg as directed every Friday.   VITAMIN B-12 PO Take 1 tablet by mouth daily.        Follow-up Information     [name], MD Follow up.   Specialty: Family Medicine Contact information: [address] [place] [address] [phone]                 Consultations: GI  Procedures/Studies:  US Abdomen Limited RUQ (LIVER/GB)  Result Date: 02/18/2023 CLINICAL DATA:  History of prior cholecystectomy, presenting with transaminitis. EXAM: ULTRASOUND ABDOMEN LIMITED RIGHT UPPER QUADRANT COMPARISON:  Nov 03, 2022 FINDINGS: Gallbladder: The gallbladder is surgically absent. Common bile duct: Diameter: 5.1 Liver: No focal lesion identified. Diffusely increased echogenicity of the liver parenchyma is noted. Portal vein is patent on color Doppler imaging with normal direction of blood flow towards the liver. Other: None. IMPRESSION: 1. Findings consistent with history of prior cholecystectomy. 2. Hepatic steatosis. Electronically Signed   By: Aram Candela M.D.   On: 02/18/2023 22:56     Subjective: - no chest pain, shortness of breath, no abdominal pain, nausea or vomiting.   Discharge Exam: BP (!) 143/57 (BP Location: Right Arm)   Pulse 79   Temp 98.5 F (36.9 C) (Oral)   Resp 17   SpO2 97%   General: Pt is alert, awake, not in acute distress Cardiovascular: RRR, S1/S2 +, no rubs, no gallops Respiratory: CTA bilaterally, no wheezing, no rhonchi Abdominal: Soft, NT, ND, bowel sounds +  The results of significant diagnostics from this hospitalization (including imaging, microbiology, ancillary and laboratory) are listed below for reference.     Microbiology: No results found for this or any previous visit (from the past 240 hour(s)).   Labs: Basic Metabolic Panel: Recent Labs  Lab 02/18/23 1911 02/18/23 2231  02/19/23 0442 02/20/23 0357  NA 131*  --  134* 135  K 5.7* 5.6* 4.7 4.0  CL 97*  --  104 103  CO2 22  --  22 26  GLUCOSE 311*  --  162* 163*  BUN 46*  --  47* 32*  CREATININE 2.31*  --  2.04* 1.60*  CALCIUM 8.6*  --  8.1* 8.0*  MG  --   --   --  1.7   Liver Function Tests: Recent Labs  Lab 02/18/23 1911 02/19/23 0442 02/20/23 0357  AST 897* 511* 185*  ALT 526* 455* 279*  ALKPHOS 108 98 88  BILITOT 4.1* 3.8* 1.0  PROT 6.3* 5.2* 5.3*  ALBUMIN 3.4* 2.8* 2.6*   CBC: Recent Labs  Lab 02/18/23 1911 02/19/23 0442 02/20/23 0357  WBC 16.3* 12.1* 9.0  HGB 11.5* 9.1* 8.4*  HCT 35.8* 26.9* 24.9*  MCV 90.2 88.2 89.9  PLT 235 182 160   CBG: Recent Labs  Lab 02/19/23 0736 02/19/23 1128 02/19/23 1540 02/19/23 2023 02/20/23 0756  GLUCAP 153* 149* 211* 203* 142*   Hgb A1c Recent Labs    02/19/23 0443  HGBA1C 7.6*   Lipid Profile No results for input(s): "CHOL", "HDL", "LDLCALC", "TRIG", "CHOLHDL", "LDLDIRECT" in the last 72 hours. Thyroid function studies  No results for input(s): "TSH", "T4TOTAL", "T3FREE", "THYROIDAB" in the last 72 hours.  Invalid input(s): "FREET3" Urinalysis No results found for: "COLORURINE", "APPEARANCEUR", "LABSPEC", "PHURINE", "GLUCOSEU", "HGBUR", "BILIRUBINUR", "KETONESUR", "PROTEINUR", "UROBILINOGEN", "NITRITE", "LEUKOCYTESUR"  FURTHER DISCHARGE INSTRUCTIONS:   Get Medicines reviewed and adjusted: Please take all your medications with you for your next visit with your Primary MD   Laboratory/radiological data: Please request your Primary MD to go over all hospital tests and procedure/radiological results at the follow up, please ask your Primary MD to get all Hospital records sent to his/her office.   In some cases, they will be blood work, cultures and biopsy results pending at the time of your discharge. Please request that your primary care M.D. goes through all the records of your hospital data and follows up on these results.    Also Note the following: If you experience worsening of your admission symptoms, develop shortness of breath, life threatening emergency, suicidal or homicidal thoughts you must seek medical attention immediately by calling 911 or calling your MD immediately  if symptoms less severe.   You must read complete instructions/literature along with all the possible adverse reactions/side effects for all the Medicines you take and that have been prescribed to you. Take any new Medicines after you have completely understood and accpet all the possible adverse reactions/side effects.    Do not drive when taking Pain medications or sleeping medications (Benzodaizepines)   Do not take more than prescribed Pain, Sleep and Anxiety Medications. It is not advisable to combine anxiety,sleep and pain medications without talking with your primary care practitioner   Special Instructions: If you have smoked or chewed Tobacco  in the last 2 yrs please stop smoking, stop any regular Alcohol  and or any Recreational drug use.   Wear Seat belts while driving.   Please note: You were cared for by a hospitalist during your hospital stay. Once you are discharged, your primary care physician will handle any further medical issues. Please note that NO REFILLS for any discharge medications will be authorized once you are discharged, as it is imperative that you return to your primary care physician (or establish a relationship with a primary care physician if you do not have one) for your post hospital discharge needs so that they can reassess your need for medications and monitor your lab values.  Time coordinating discharge: 35 minutes  SIGNED:  Pamella Pert, MD, PhD 02/20/2023, 10:44 AM

## 2023-02-20 NOTE — Plan of Care (Signed)

## 2023-02-20 NOTE — Progress Notes (Signed)
AVS given and explained to patient, awaiting for his ride.

## 2023-02-20 NOTE — Progress Notes (Signed)
[name] Gastroenterology Progress Note  Steven Mcclure 72 y.o. 01-20-1951  CC: GI bleed   Subjective: Patient seen and examined at bedside.  He is feeling better.  Denies any further bleeding episodes.  Denies abdominal pain, nausea or vomiting.  ROS : Afebrile, negative for chest pain   Objective: Vital signs in last 24 hours: Vitals:   02/19/23 2020 02/20/23 0758  BP: (!) 122/50 (!) 143/57  Pulse: 73 79  Resp: 18 17  Temp: 98.6 F (37 C) 98.5 F (36.9 C)  SpO2: 99% 97%    Physical Exam:  General:  Alert, cooperative, no distress, appears stated age  Head:  Normocephalic, without obvious abnormality, atraumatic  Eyes:  , EOM's intact,   Lungs:   No visible respiratory distress  Heart:  Regular rate and rhythm, S1, S2 normal  Abdomen:   Soft, non-tender, bowel sound present, no peritoneal signs  Extremities: Extremities normal, atraumatic, no  edema  Pulses: 2+ and symmetric    Lab Results: Recent Labs    02/19/23 0442 02/20/23 0357  NA 134* 135  K 4.7 4.0  CL 104 103  CO2 22 26  GLUCOSE 162* 163*  BUN 47* 32*  CREATININE 2.04* 1.60*  CALCIUM 8.1* 8.0*  MG  --  1.7   Recent Labs    02/19/23 0442 02/20/23 0357  AST 511* 185*  ALT 455* 279*  ALKPHOS 98 88  BILITOT 3.8* 1.0  PROT 5.2* 5.3*  ALBUMIN 2.8* 2.6*   Recent Labs    02/19/23 0442 02/20/23 0357  WBC 12.1* 9.0  HGB 9.1* 8.4*  HCT 26.9* 24.9*  MCV 88.2 89.9  PLT 182 160   Recent Labs    02/19/23 0442 02/20/23 0357  LABPROT 16.4* 14.2  INR 1.3* 1.1      Assessment/Plan: -Rectal bleeding in setting of recent colonoscopy with polypectomy.  Most likely post polypectomy bleeding.  Rectal bleeding resolved now. -Elevated LFTs. ?  Likely transient.  He was recently started on subcu injection 2 weeks ago for his arthritis by rheumatologist.  Patient does not know the name of the medication.  Negative hepatitis panel. -Chronic kidney disease -History of coronary artery disease -Chronic  leukocytosis.  Was seen by hematology.   Recommendations ------------------------ -No further bleeding episodes.  Hemoglobin stable.  Mild drop today could be dilutional as all 3 cell lines have decreased. -LFTs also improving. -No further inpatient GI workup planned at this time. -Recommend follow-up with primary GI doctor Dr. Dulce Sellar in 2 months after discharge.  Okay to discharge from GI standpoint.  GI will sign off.  Call us back if needed.   Kathi Der MD, FACP 02/20/2023, 9:14 AM  Contact #  [phone]

## 2023-03-12 DIAGNOSIS — G4733 Obstructive sleep apnea (adult) (pediatric): Secondary | ICD-10-CM | POA: Diagnosis not present

## 2023-03-16 NOTE — Progress Notes (Deleted)
[facility] OFFICE PROGRESS NOTE  [name], MD [address] [place] [address]  DIAGNOSIS: Leukocytosis   PRIOR THERAPY: None   CURRENT THERAPY: Observation   INTERVAL HISTORY: Steven Mcclure 72 y.o. male returns clinic today for follow-up visit.  The patient establish care with [place] for leukocytosis which was felt to be reactive in the setting of the patient having cholecystitis just prior and cholecystitis.  His JAK2 and BCR/able were negative.   For we recommended that he stay on observation with repeat blood work and follow-up in 3 months.  In the interval since last being seen he has been feeling well except he was hospitalized on 9/11-9/13 for lower GI bleed/hematochezia after a routine colonoscopy.  He had a polypectomy during his colonoscopy.  The bleed was felt to be secondary to polyp bleed.  His hemoglobin did drop but remained stable.  Symptoms resolved and he was discharged home.  Oral he is feeling ***today.  Denies any steroid use.  Denies any recent infections.  He denies any fever, chills, or night sweats.  Denies any unexplained weight loss.  Denies any signs or symptoms of infection.  Denies any rashes.  Denies any hormone use.  Denies any unusual fatigue, bleeding, or dyspnea on exertion.  Denies any nausea or vomiting.  Denies any lymphadenopathy.  He is here today for evaluation repeat blood work.     MEDICAL HISTORY: Past Medical History:  Diagnosis Date   Body mass index 40.0-44.9, adult (HCC)    CKD (chronic kidney disease), stage III (HCC)    Coronary atherosclerosis due to severely calcified coronary lesion    Diabetes mellitus without complication (HCC)    ED (erectile dysfunction)    Gout    History of kidney stones    Hx of colonic polyps    Hyperlipidemia    Hypertension    Long-term insulin use in type 2 diabetes (HCC)    Low testosterone in male    Obesity    Vertigo    Vitamin B12 deficiency      ALLERGIES:  is allergic to crestor [rosuvastatin], lipitor [atorvastatin], hydrocod poli-chlorphe poli er, and tussin cough [dextromethorphan hbr].  MEDICATIONS:  Current Outpatient Medications  Medication Sig Dispense Refill   acetaminophen (TYLENOL) 325 MG tablet Take 2 tablets (650 mg total) by mouth every 6 (six) hours as needed for headache or mild pain.     albuterol (VENTOLIN HFA) 108 (90 Base) MCG/ACT inhaler Inhale 1-2 puffs into the lungs every 6 (six) hours as needed for shortness of breath or wheezing.     allopurinol (ZYLOPRIM) 100 MG tablet TAKE 1 TABLET BY MOUTH TWICE A DAY (Patient taking differently: Take 200 mg by mouth daily.) 180 tablet 3   aspirin EC 81 MG tablet Take 81 mg by mouth daily.     Cyanocobalamin (VITAMIN B-12 PO) Take 1 tablet by mouth daily.     ezetimibe (ZETIA) 10 MG tablet Take 10 mg by mouth every other day.     leflunomide (ARAVA) 20 MG tablet Take 20 mg by mouth daily.     loratadine (CLARITIN) 10 MG tablet Take 20 mg by mouth daily.     metFORMIN (GLUCOPHAGE) 1000 MG tablet Take 1 tablet (1,000 mg total) by mouth daily.  10   Multiple Vitamin (MULTIVITAMIN WITH MINERALS) TABS tablet Take 1 tablet by mouth daily.     Omega-3 Fatty Acids (FISH OIL) 1200 MG CAPS Take 1,200 mg by mouth daily.  pantoprazole (PROTONIX) 40 MG tablet Take 1 tablet (40 mg total) by mouth daily. 30 tablet 2   rosuvastatin (CRESTOR) 20 MG tablet Take 1 tablet (20 mg total) by mouth daily. 90 tablet 3   TRULICITY 3 MG/0.5ML SOPN Inject 3 mg as directed every Friday.     Current Facility-Administered Medications  Medication Dose Route Frequency Provider Last Rate Last Admin   triamcinolone acetonide (KENALOG) 10 MG/ML injection 10 mg  10 mg Other Once Vivi Barrack, DPM        SURGICAL HISTORY:  Past Surgical History:  Procedure Laterality Date   CHOLECYSTECTOMY N/A 11/04/2022   Procedure: LAPAROSCOPIC CHOLECYSTECTOMY WITH ICG DYE;  Surgeon: Berna Bue,  MD;  Location: [facility];  Service: General;  Laterality: N/A;   KIDNEY STONE SURGERY     LEFT HEART CATH AND CORONARY ANGIOGRAPHY N/A 12/24/2020   Procedure: LEFT HEART CATH AND CORONARY ANGIOGRAPHY;  Surgeon: Corky Crafts, MD;  Location: [facility] INVASIVE CV LAB;  Service: Cardiovascular;  Laterality: N/A;    REVIEW OF SYSTEMS:   Review of Systems  Constitutional: Negative for appetite change, chills, fatigue, fever and unexpected weight change.  HENT:   Negative for mouth sores, nosebleeds, sore throat and trouble swallowing.   Eyes: Negative for eye problems and icterus.  Respiratory: Negative for cough, hemoptysis, shortness of breath and wheezing.   Cardiovascular: Negative for chest pain and leg swelling.  Gastrointestinal: Negative for abdominal pain, constipation, diarrhea, nausea and vomiting.  Genitourinary: Negative for bladder incontinence, difficulty urinating, dysuria, frequency and hematuria.   Musculoskeletal: Negative for back pain, gait problem, neck pain and neck stiffness.  Skin: Negative for itching and rash.  Neurological: Negative for dizziness, extremity weakness, gait problem, headaches, light-headedness and seizures.  Hematological: Negative for adenopathy. Does not bruise/bleed easily.  Psychiatric/Behavioral: Negative for confusion, depression and sleep disturbance. The patient is not nervous/anxious.     PHYSICAL EXAMINATION:  There were no vitals taken for this visit.  ECOG PERFORMANCE STATUS: {CHL ONC ECOG [id number]  Physical Exam  Constitutional: Oriented to person, place, and time and well-developed, well-nourished, and in no distress. No distress.  HENT:  Head: Normocephalic and atraumatic.  Mouth/Throat: Oropharynx is clear and moist. No oropharyngeal exudate.  Eyes: Conjunctivae are normal. Right eye exhibits no discharge. Left eye exhibits no discharge. No scleral icterus.  Neck: Normal range of motion. Neck supple.  Cardiovascular: Normal  rate, regular rhythm, normal heart sounds and intact distal pulses.   Pulmonary/Chest: Effort normal and breath sounds normal. No respiratory distress. No wheezes. No rales.  Abdominal: Soft. Bowel sounds are normal. Exhibits no distension and no mass. There is no tenderness.  Musculoskeletal: Normal range of motion. Exhibits no edema.  Lymphadenopathy:    No cervical adenopathy.  Neurological: Alert and oriented to person, place, and time. Exhibits normal muscle tone. Gait normal. Coordination normal.  Skin: Skin is warm and dry. No rash noted. Not diaphoretic. No erythema. No pallor.  Psychiatric: Mood, memory and judgment normal.  Vitals reviewed.  LABORATORY DATA: Lab Results  Component Value Date   WBC 9.0 02/20/2023   HGB 8.4 (L) 02/20/2023   HCT 24.9 (L) 02/20/2023   MCV 89.9 02/20/2023   PLT 160 02/20/2023      Chemistry      Component Value Date/Time   NA 135 02/20/2023 0357   NA 141 05/14/2021 1435   K 4.0 02/20/2023 0357   CL 103 02/20/2023 0357   CO2 26 02/20/2023 0357  BUN 32 (H) 02/20/2023 0357   BUN 24 05/14/2021 1435   CREATININE 1.60 (H) 02/20/2023 0357   CREATININE 1.27 (H) 12/17/2022 1251   CREATININE 1.61 (H) 08/02/2019 0943      Component Value Date/Time   CALCIUM 8.0 (L) 02/20/2023 0357   ALKPHOS 88 02/20/2023 0357   AST 185 (H) 02/20/2023 0357   AST 21 12/17/2022 1251   ALT 279 (H) 02/20/2023 0357   ALT 23 12/17/2022 1251   BILITOT 1.0 02/20/2023 0357   BILITOT 0.5 12/17/2022 1251       RADIOGRAPHIC STUDIES:  US Abdomen Limited RUQ (LIVER/GB)  Result Date: 02/18/2023 CLINICAL DATA:  History of prior cholecystectomy, presenting with transaminitis. EXAM: ULTRASOUND ABDOMEN LIMITED RIGHT UPPER QUADRANT COMPARISON:  Nov 03, 2022 FINDINGS: Gallbladder: The gallbladder is surgically absent. Common bile duct: Diameter: 5.1 Liver: No focal lesion identified. Diffusely increased echogenicity of the liver parenchyma is noted. Portal vein is patent on  color Doppler imaging with normal direction of blood flow towards the liver. Other: None. IMPRESSION: 1. Findings consistent with history of prior cholecystectomy. 2. Hepatic steatosis. Electronically Signed   By: Aram Candela M.D.   On: 02/18/2023 22:56     ASSESSMENT/PLAN:  This is a very pleasant 72 year old Caucasian male referred to clinic for leukocytosis.  This is felt to be reactive.  His JAK2 and BCR/able was negative.  The patient had repeat blood work that showed WBC of ***. His Hbg is *** and platelet ***. Dr. Arbutus Ped recommends ***  Dr. Arbutus Ped recommends that patient continue on observation. He can follow with his PCP. However, we would be happy to see the patient on an as needed basis if any concerning leukocytosis in the future.   The patient was advised to call immediately if he has any concerning symptoms in the interval. The patient voices understanding of current disease status and treatment options and is in agreement with the current care plan. All questions were answered. The patient knows to call the clinic with any problems, questions or concerns. We can certainly see the patient much sooner if necessary      No orders of the defined types were placed in this encounter.    I spent {CHL ONC TIME VISIT - YQMVH:[id number]} counseling the patient face to face. The total time spent in the appointment was {CHL ONC TIME VISIT - UXLKG:[id number]}.  Aleen Marston L Alette Kataoka, PA-C 03/16/23

## 2023-03-19 ENCOUNTER — Inpatient Hospital Stay: Payer: PPO | Admitting: Physician Assistant

## 2023-03-19 ENCOUNTER — Inpatient Hospital Stay: Payer: PPO | Attending: Physician Assistant

## 2023-03-19 DIAGNOSIS — D72829 Elevated white blood cell count, unspecified: Secondary | ICD-10-CM | POA: Insufficient documentation

## 2023-03-19 DIAGNOSIS — D649 Anemia, unspecified: Secondary | ICD-10-CM | POA: Insufficient documentation

## 2023-03-19 DIAGNOSIS — D72819 Decreased white blood cell count, unspecified: Secondary | ICD-10-CM | POA: Insufficient documentation

## 2023-03-30 NOTE — Progress Notes (Signed)
[facility] OFFICE PROGRESS NOTE  [name], MD [address] [place] [address]  DIAGNOSIS: Leukocytosis   PRIOR THERAPY: None   CURRENT THERAPY: Observation   INTERVAL HISTORY: Steven Mcclure 72 y.o. male returns clinic today for a follow-up visit.  The patient establish care with [place] for leukocytosis which was felt to be reactive in the setting of the patient having cholecystectomy due to cholecystitis. His JAK2 and BCR/able were negative. We recommended that he stay on observation with repeat blood work and follow-up in 3 months.He also sees rheumatology for joint stiffness/inflammation. He started a new medication for this last month called Enbrel. He is expected to see Rheumatologist to follow up on this next month on 04/24/23.   In the interval since last being seen he has been feeling well except he was hospitalized on 9/11-9/13 for lower GI bleed/hematochezia after a routine colonoscopy.  He had a polypectomy during his colonoscopy.  The bleed was felt to be secondary to polyp bleed.  His hemoglobin did drop to 8.4. He is not taking an iron supplement at this time.  He is just taking a multivitamin.  He denies any abnormal bleeding or bruising at this time.    Overall he is feeling well today.  He denies any steroid use.  Denies any recent infections, specifically he denies any nasal congestion, sore throat, cough, skin infections, dysuria, or diarrhea.  He denies any recent fever, chills, night sweats, or unexplained weight loss.  Denies any hormone use.  He is here today for evaluation and repeat blood work.   MEDICAL HISTORY: Past Medical History:  Diagnosis Date   Body mass index 40.0-44.9, adult (HCC)    CKD (chronic kidney disease), stage III (HCC)    Coronary atherosclerosis due to severely calcified coronary lesion    Diabetes mellitus without complication (HCC)    ED (erectile dysfunction)    Gout    History of kidney stones    Hx of  colonic polyps    Hyperlipidemia    Hypertension    Long-term insulin use in type 2 diabetes (HCC)    Low testosterone in male    Obesity    Vertigo    Vitamin B12 deficiency     ALLERGIES:  is allergic to crestor [rosuvastatin], lipitor [atorvastatin], hydrocod poli-chlorphe poli er, and tussin cough [dextromethorphan hbr].  MEDICATIONS:  Current Outpatient Medications  Medication Sig Dispense Refill   acetaminophen (TYLENOL) 325 MG tablet Take 2 tablets (650 mg total) by mouth every 6 (six) hours as needed for headache or mild pain.     albuterol (VENTOLIN HFA) 108 (90 Base) MCG/ACT inhaler Inhale 1-2 puffs into the lungs every 6 (six) hours as needed for shortness of breath or wheezing.     allopurinol (ZYLOPRIM) 100 MG tablet TAKE 1 TABLET BY MOUTH TWICE A DAY (Patient taking differently: Take 200 mg by mouth daily.) 180 tablet 3   aspirin EC 81 MG tablet Take 81 mg by mouth daily.     Cyanocobalamin (VITAMIN B-12 PO) Take 1 tablet by mouth daily.     ezetimibe (ZETIA) 10 MG tablet Take 10 mg by mouth every other day.     leflunomide (ARAVA) 20 MG tablet Take 20 mg by mouth daily.     loratadine (CLARITIN) 10 MG tablet Take 20 mg by mouth daily.     metFORMIN (GLUCOPHAGE) 1000 MG tablet Take 1 tablet (1,000 mg total) by mouth daily.  10   Multiple  Vitamin (MULTIVITAMIN WITH MINERALS) TABS tablet Take 1 tablet by mouth daily.     Omega-3 Fatty Acids (FISH OIL) 1200 MG CAPS Take 1,200 mg by mouth daily.     pantoprazole (PROTONIX) 40 MG tablet Take 1 tablet (40 mg total) by mouth daily. 30 tablet 2   rosuvastatin (CRESTOR) 20 MG tablet Take 1 tablet (20 mg total) by mouth daily. 90 tablet 3   TRULICITY 3 MG/0.5ML SOPN Inject 3 mg as directed every Friday.     Current Facility-Administered Medications  Medication Dose Route Frequency Provider Last Rate Last Admin   triamcinolone acetonide (KENALOG) 10 MG/ML injection 10 mg  10 mg Other Once Vivi Barrack, DPM        SURGICAL  HISTORY:  Past Surgical History:  Procedure Laterality Date   CHOLECYSTECTOMY N/A 11/04/2022   Procedure: LAPAROSCOPIC CHOLECYSTECTOMY WITH ICG DYE;  Surgeon: Berna Bue, MD;  Location: [facility] OR;  Service: General;  Laterality: N/A;   KIDNEY STONE SURGERY     LEFT HEART CATH AND CORONARY ANGIOGRAPHY N/A 12/24/2020   Procedure: LEFT HEART CATH AND CORONARY ANGIOGRAPHY;  Surgeon: Corky Crafts, MD;  Location: [facility] INVASIVE CV LAB;  Service: Cardiovascular;  Laterality: N/A;    REVIEW OF SYSTEMS:   Review of Systems  Constitutional: Negative for appetite change, chills, fatigue, fever and unexpected weight change.  HENT:   Negative for mouth sores, nosebleeds, sore throat and trouble swallowing.   Eyes: Negative for eye problems and icterus.  Respiratory: Negative for cough, hemoptysis, shortness of breath and wheezing.   Cardiovascular: Negative for chest pain and leg swelling.  Gastrointestinal: Negative for abdominal pain, constipation, diarrhea, nausea and vomiting.  Genitourinary: Negative for bladder incontinence, difficulty urinating, dysuria, frequency and hematuria.   Musculoskeletal: Positive for joint stiffness/swelling. Negative for back pain, gait problem, neck pain and neck stiffness.  Skin: Negative for itching and rash.  Neurological: Negative for dizziness, extremity weakness, gait problem, headaches, light-headedness and seizures.  Hematological: Negative for adenopathy. Does not bruise/bleed easily.  Psychiatric/Behavioral: Negative for confusion, depression and sleep disturbance. The patient is not nervous/anxious.     PHYSICAL EXAMINATION:  Blood pressure (!) 168/68, pulse 72, temperature 97.7 F (36.5 C), temperature source Oral, resp. rate 15, weight 276 lb 9.6 oz (125.5 kg), SpO2 98%.  ECOG PERFORMANCE STATUS: 0  Physical Exam  Constitutional: Oriented to person, place, and time and well-developed, well-nourished, and in no distress.  HENT:  Head:  Normocephalic and atraumatic.  Mouth/Throat: Oropharynx is clear and moist. No oropharyngeal exudate.  Eyes: Conjunctivae are normal. Right eye exhibits no discharge. Left eye exhibits no discharge. No scleral icterus.  Neck: Normal range of motion. Neck supple.  Cardiovascular: Normal rate, regular rhythm, normal heart sounds and intact distal pulses.   Pulmonary/Chest: Effort normal and breath sounds normal. No respiratory distress. No wheezes. No rales.  Abdominal: Soft. Bowel sounds are normal. Exhibits no distension and no mass. There is no tenderness.  Musculoskeletal: Normal range of motion. Exhibits no edema.  Lymphadenopathy:    No cervical adenopathy.  Neurological: Alert and oriented to person, place, and time. Exhibits normal muscle tone. Gait normal. Coordination normal.  Skin: Skin is warm and dry. No rash noted. Not diaphoretic. No erythema. No pallor.  Psychiatric: Mood, memory and judgment normal.  Vitals reviewed.  LABORATORY DATA: Lab Results  Component Value Date   WBC 11.4 (H) 04/03/2023   HGB 10.9 (L) 04/03/2023   HCT 33.2 (L) 04/03/2023   MCV 81.6  04/03/2023   PLT 298 04/03/2023      Chemistry      Component Value Date/Time   NA 135 02/20/2023 0357   NA 141 05/14/2021 1435   K 4.0 02/20/2023 0357   CL 103 02/20/2023 0357   CO2 26 02/20/2023 0357   BUN 32 (H) 02/20/2023 0357   BUN 24 05/14/2021 1435   CREATININE 1.60 (H) 02/20/2023 0357   CREATININE 1.27 (H) 12/17/2022 1251   CREATININE 1.61 (H) 08/02/2019 0943      Component Value Date/Time   CALCIUM 8.0 (L) 02/20/2023 0357   ALKPHOS 88 02/20/2023 0357   AST 185 (H) 02/20/2023 0357   AST 21 12/17/2022 1251   ALT 279 (H) 02/20/2023 0357   ALT 23 12/17/2022 1251   BILITOT 1.0 02/20/2023 0357   BILITOT 0.5 12/17/2022 1251       RADIOGRAPHIC STUDIES:  No results found.   ASSESSMENT/PLAN:  This is a very pleasant 72 year old Caucasian male referred to clinic for leukocytosis.  This is felt  to be reactive.  His JAK2 and BCR/able was negative.  The patient had repeat blood work that showed mildly WBC of 11.4. He denies signs of infection but does see rheumatology for arthritis and joint pain/stiffness. His Hbg is trending upwards from his recent hospitalization last month. His Hbg is 10.9. I had ordered iron, ferritin, B12, and folate to ensure no other vitamin deficiencies that need replacing for his anemia. I will call him if he needs to take any iron supplements in addition to his multivitamin. He informed me he is already taking B12 and has been on this for some time.   We will see him back in 6 months with repeat labs for monitoring of the anemia and leukocytopenia. If normal at his next appointment, we can consider seeing him on an as needed basis.   The patient was advised to call immediately if he has any concerning symptoms in the interval. The patient voices understanding of current disease status and treatment options and is in agreement with the current care plan. All questions were answered. The patient knows to call the clinic with any problems, questions or concerns. We can certainly see the patient much sooner if necessary      Orders Placed This Encounter  Procedures   CBC with Differential (Cancer Center Only)    Standing Status:   Future    Standing Expiration Date:   04/02/2024     The total time spent in the appointment was 20-29 minutes  Casia Corti L Kalika Smay, PA-C 04/03/23

## 2023-04-03 ENCOUNTER — Other Ambulatory Visit: Payer: Self-pay | Admitting: Physician Assistant

## 2023-04-03 ENCOUNTER — Inpatient Hospital Stay: Payer: PPO

## 2023-04-03 ENCOUNTER — Inpatient Hospital Stay: Payer: PPO | Admitting: Physician Assistant

## 2023-04-03 ENCOUNTER — Telehealth: Payer: Self-pay | Admitting: Physician Assistant

## 2023-04-03 ENCOUNTER — Other Ambulatory Visit: Payer: Self-pay

## 2023-04-03 VITALS — BP 168/68 | HR 72 | Temp 97.7°F | Resp 15 | Wt 276.6 lb

## 2023-04-03 DIAGNOSIS — D649 Anemia, unspecified: Secondary | ICD-10-CM

## 2023-04-03 DIAGNOSIS — D72829 Elevated white blood cell count, unspecified: Secondary | ICD-10-CM | POA: Diagnosis not present

## 2023-04-03 DIAGNOSIS — D72819 Decreased white blood cell count, unspecified: Secondary | ICD-10-CM | POA: Diagnosis not present

## 2023-04-03 DIAGNOSIS — D500 Iron deficiency anemia secondary to blood loss (chronic): Secondary | ICD-10-CM

## 2023-04-03 LAB — CBC WITH DIFFERENTIAL (CANCER CENTER ONLY)
Abs Immature Granulocytes: 0.03 10*3/uL (ref 0.00–0.07)
Basophils Absolute: 0.1 10*3/uL (ref 0.0–0.1)
Basophils Relative: 1 %
Eosinophils Absolute: 0.3 10*3/uL (ref 0.0–0.5)
Eosinophils Relative: 2 %
HCT: 33.2 % — ABNORMAL LOW (ref 39.0–52.0)
Hemoglobin: 10.9 g/dL — ABNORMAL LOW (ref 13.0–17.0)
Immature Granulocytes: 0 %
Lymphocytes Relative: 24 %
Lymphs Abs: 2.8 10*3/uL (ref 0.7–4.0)
MCH: 26.8 pg (ref 26.0–34.0)
MCHC: 32.8 g/dL (ref 30.0–36.0)
MCV: 81.6 fL (ref 80.0–100.0)
Monocytes Absolute: 1 10*3/uL (ref 0.1–1.0)
Monocytes Relative: 9 %
Neutro Abs: 7.2 10*3/uL (ref 1.7–7.7)
Neutrophils Relative %: 64 %
Platelet Count: 298 10*3/uL (ref 150–400)
RBC: 4.07 MIL/uL — ABNORMAL LOW (ref 4.22–5.81)
RDW: 13.8 % (ref 11.5–15.5)
WBC Count: 11.4 10*3/uL — ABNORMAL HIGH (ref 4.0–10.5)
nRBC: 0 % (ref 0.0–0.2)

## 2023-04-03 LAB — IRON AND IRON BINDING CAPACITY (CC-WL,HP ONLY)
Iron: 53 ug/dL (ref 45–182)
Saturation Ratios: 11 % — ABNORMAL LOW (ref 17.9–39.5)
TIBC: 486 ug/dL — ABNORMAL HIGH (ref 250–450)
UIBC: 433 ug/dL — ABNORMAL HIGH (ref 117–376)

## 2023-04-03 LAB — FERRITIN: Ferritin: 5 ng/mL — ABNORMAL LOW (ref 24–336)

## 2023-04-03 LAB — SAMPLE TO BLOOD BANK

## 2023-04-03 LAB — VITAMIN B12: Vitamin B-12: 573 pg/mL (ref 180–914)

## 2023-04-03 NOTE — Telephone Encounter (Signed)
I called the patient to let him know that his iron saturation is low likely from his GI bleed last month.  I recommended he take an iron supplement p.o. daily for the next 3 months or so and we can recheck this at his next appointment.  He is going to pick up iron over-the-counter.  We talked about common side effects may include GI upset/constipation.  He already takes medication to prevent constipation.  Alternatively, if he has trouble with the iron supplement, he may also take a slow release iron as well.  He mentioned during his appointment today that he takes B12 his vitamin B12 resulted and is normal so he will continue taking his B12 as he is currently taking it.

## 2023-04-06 LAB — FOLATE: Folate: 24.4 ng/mL (ref >5.9)

## 2023-04-13 DIAGNOSIS — M25472 Effusion, left ankle: Secondary | ICD-10-CM | POA: Diagnosis not present

## 2023-04-13 DIAGNOSIS — N1831 Chronic kidney disease, stage 3a: Secondary | ICD-10-CM | POA: Diagnosis not present

## 2023-04-13 DIAGNOSIS — M459 Ankylosing spondylitis of unspecified sites in spine: Secondary | ICD-10-CM | POA: Diagnosis not present

## 2023-04-13 DIAGNOSIS — M79643 Pain in unspecified hand: Secondary | ICD-10-CM | POA: Diagnosis not present

## 2023-04-13 DIAGNOSIS — K760 Fatty (change of) liver, not elsewhere classified: Secondary | ICD-10-CM | POA: Diagnosis not present

## 2023-04-13 DIAGNOSIS — M25572 Pain in left ankle and joints of left foot: Secondary | ICD-10-CM | POA: Diagnosis not present

## 2023-04-13 DIAGNOSIS — M109 Gout, unspecified: Secondary | ICD-10-CM | POA: Diagnosis not present

## 2023-04-13 DIAGNOSIS — Z79899 Other long term (current) drug therapy: Secondary | ICD-10-CM | POA: Diagnosis not present

## 2023-04-13 DIAGNOSIS — E1169 Type 2 diabetes mellitus with other specified complication: Secondary | ICD-10-CM | POA: Diagnosis not present

## 2023-04-18 ENCOUNTER — Other Ambulatory Visit: Payer: Self-pay | Admitting: Cardiology

## 2023-05-04 ENCOUNTER — Telehealth: Payer: Self-pay | Admitting: Cardiology

## 2023-05-04 NOTE — Telephone Encounter (Signed)
RX already sent to Pharmacy

## 2023-05-04 NOTE — Telephone Encounter (Signed)
*  STAT* If patient is at the pharmacy, call can be transferred to refill team.   1. Which medications need to be refilled? (please list name of each medication and dose if known)   lisinopril (ZESTRIL) 20 MG tablet    2. Which pharmacy/location (including street and city if local pharmacy) is medication to be sent to? [id number] - [place], [place] - [address] AT [facility] [address] Phone: [phone]  Fax: [phone]      3. Do they need a 30 day or 90 day supply? 90 Please re-send pharmacy told pt they do not have script. Also pt is completely out

## 2023-05-06 ENCOUNTER — Other Ambulatory Visit: Payer: Self-pay

## 2023-05-06 ENCOUNTER — Other Ambulatory Visit: Payer: Self-pay | Admitting: Cardiology

## 2023-05-06 ENCOUNTER — Telehealth: Payer: Self-pay | Admitting: Cardiology

## 2023-05-06 MED ORDER — LISINOPRIL 20 MG PO TABS: 20.0000 mg | ORAL_TABLET | Freq: Every day | ORAL | 0 refills | Status: DC

## 2023-05-06 NOTE — Telephone Encounter (Signed)
*  STAT* If patient is at the pharmacy, call can be transferred to refill team.   1. Which medications need to be refilled? (please list name of each medication and dose if known) lisinopril (ZESTRIL) 20 MG tablet    2. Would you like to learn more about the convenience, safety, & potential cost savings by using the [facility]?     3. Are you open to using the [facility] (Type [facility].  ).   4. Which pharmacy/location (including street and city if local pharmacy) is medication to be sent to? [id number] - [place], [place] - [address] AT [address] OF [address]    5. Do they need a 30 day or 90 day supply? 90

## 2023-05-06 NOTE — Telephone Encounter (Signed)
New Rx sent to requested Pharmacy

## 2023-05-18 ENCOUNTER — Other Ambulatory Visit: Payer: Self-pay | Admitting: Cardiology

## 2023-05-25 ENCOUNTER — Other Ambulatory Visit: Payer: Self-pay | Admitting: Cardiology

## 2023-06-01 ENCOUNTER — Telehealth: Payer: Self-pay | Admitting: Cardiology

## 2023-06-01 MED ORDER — ROSUVASTATIN CALCIUM 20 MG PO TABS: 20.0000 mg | ORAL_TABLET | Freq: Every day | ORAL | 0 refills | Status: DC

## 2023-06-01 MED ORDER — LISINOPRIL 20 MG PO TABS: 20.0000 mg | ORAL_TABLET | Freq: Every day | ORAL | 0 refills | Status: DC

## 2023-06-01 NOTE — Telephone Encounter (Signed)
*  STAT* If patient is at the pharmacy, call can be transferred to refill team.   1. Which medications need to be refilled? (please list name of each medication and dose if known) rosuvastatin (CRESTOR) 20 MG tablet and lisinopril (ZESTRIL) 20 MG tablet    2. Would you like to learn more about the convenience, safety, & potential cost savings by using the [facility]? NO   3. Are you open to using the [facility] NO   4. Which pharmacy/location (including street and city if local pharmacy) is medication to be sent to? [id number] - [place], [place] - [address] AT [address]     5. Do they need a 30 day or 90 day supply? 90  Patient scheduled to see Dr. Anne Fu on 07/01/23 but states that he will run out of medication by then.

## 2023-07-01 ENCOUNTER — Ambulatory Visit: Payer: PPO | Attending: Cardiology | Admitting: Cardiology

## 2023-07-01 VITALS — BP 126/78 | HR 89 | Ht 70.5 in | Wt 265.0 lb

## 2023-07-01 DIAGNOSIS — E7800 Pure hypercholesterolemia, unspecified: Secondary | ICD-10-CM

## 2023-07-01 DIAGNOSIS — I10 Essential (primary) hypertension: Secondary | ICD-10-CM

## 2023-07-01 DIAGNOSIS — I2510 Atherosclerotic heart disease of native coronary artery without angina pectoris: Secondary | ICD-10-CM | POA: Diagnosis not present

## 2023-07-01 MED ORDER — LISINOPRIL 20 MG PO TABS: 20.0000 mg | ORAL_TABLET | Freq: Every day | ORAL | 3 refills | Status: AC

## 2023-07-01 MED ORDER — ROSUVASTATIN CALCIUM 20 MG PO TABS: 20.0000 mg | ORAL_TABLET | Freq: Every day | ORAL | 3 refills | Status: AC

## 2023-07-01 NOTE — Progress Notes (Signed)
Cardiology Office Note:  .   Date:  07/01/2023  ID:  Steven Mcclure, DOB May 03, 1951, MRN [id number] PCP: [name], MD  [facility] Providers Cardiologist:  Donato Schultz, MD     History of Present Illness: .   Steven Mcclure is a 73 y.o. male Discussed with the use of AI scribe   History of Present Illness   The 73 year old patient with a history of coronary artery disease, hypertension, hyperlipidemia, and diabetes presents for a follow-up visit. He underwent a cardiac catheterization in 2022, which revealed mild to moderate nonobstructive CAD. The patient was advised to take aspirin and a statin for preventive therapy. His coronary calcium score in 2022 was significantly high, placing him in the 94th percentile. The patient has a family history of heart disease, with a brother who underwent bypass surgery and PCI. He has been managing his hyperlipidemia with Zetia and Crestor, achieving an LDL level of 31.  The patient also has a history of gallbladder issues, which caused significant pain across his abdomen. After a CT scan, it was determined that the gallbladder was the source of the discomfort. The patient reports no current chest pain or shortness of breath. He has been compliant with his medication regimen, which includes baby aspirin and fish oil, in addition to his prescribed medications.  The patient's lifestyle includes occasional consumption of fried chicken, but he generally avoids greasy foods. He has been active, although recent cold weather has limited his outdoor activities. Despite his age and multiple comorbidities, the patient is doing well with no new complaints or significant changes in his health status.          ROS: No CP no SOB  Studies Reviewed: Marland Kitchen   EKG Interpretation Date/Time:  Wednesday July 01 2023 14:03:58 EST Ventricular Rate:  82 PR Interval:  216 QRS Duration:  102 QT Interval:  356 QTC Calculation: 415 R Axis:   23  Text  Interpretation: Sinus rhythm with sinus arrhythmia with 1st degree A-V block Incomplete right bundle branch block When compared with ECG of 18-Feb-2023 21:05, No significant change since last tracing Confirmed by Donato Schultz ([id number]) on 07/01/2023 2:05:09 PM    Results   LABS LDL: 31 Creatinine: 1.3  RADIOLOGY Coronary calcium score: 2221, 94th percentile (10/12/2020) CT scan: Cholecystopathy  DIAGNOSTIC Cardiac catheterization: Mild to moderate scattered nonobstructive CAD (12/24/2020) Echocardiogram: Normal EKG: Right bundle branch block, stable     Risk Assessment/Calculations:            Physical Exam:   VS:  BP 126/78   Pulse 89   Ht 5' 10.5" (1.791 m)   Wt 265 lb (120.2 kg)   SpO2 91%   BMI 37.49 kg/m    Wt Readings from Last 3 Encounters:  07/01/23 265 lb (120.2 kg)  04/03/23 276 lb 9.6 oz (125.5 kg)  12/17/22 266 lb 9.6 oz (120.9 kg)    GEN: Well nourished, well developed in no acute distress NECK: No JVD; No carotid bruits CARDIAC: RRR, no murmurs, no rubs, no gallops RESPIRATORY:  Clear to auscultation without rales, wheezing or rhonchi  ABDOMEN: Soft, non-tender, non-distended EXTREMITIES:  No edema; No deformity   ASSESSMENT AND PLAN: .    Assessment and Plan    Coronary Artery Disease (CAD) Mild to moderate scattered nonobstructive CAD confirmed by cardiac catheterization on 12/24/2020. Coronary calcium score on 10/12/2020 was 2221 (94th percentile). Asymptomatic with no new chest pain or dyspnea. On rosuvastatin and ezetimibe, reducing LDL to  31. Also on aspirin 81 mg daily. Family history of significant cardiac events. No stents or bypass needed at this time due to minimal disease. Emphasized monitoring for new symptoms. - Continue rosuvastatin and ezetimibe - Continue aspirin 81 mg daily - PRN cardiology follow-up; monitor with primary care physician Dustin Folks - Return to cardiology if new symptoms or concerns arise  Right Bundle Branch  Block (RBBB) RBBB identified previously. No new symptoms such as syncope or significant bradycardia. Explained RBBB as a minor electrical delay. - Monitor for symptoms like syncope or significant bradycardia and report if he occurs  Hypertension Hypertension managed. No specific details on current blood pressure readings or medications discussed. - Continue current antihypertensive regimen as prescribed by primary care physician  Hyperlipidemia Hyperlipidemia managed with rosuvastatin and ezetimibe. LDL well-controlled at 31. 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 No specific details on current management or glycemic control discussed. - Continue current diabetes management regimen as prescribed by primary care physician  Gastroesophageal Reflux Disease (GERD) GERD managed. No specific details on current symptoms or management discussed. - Continue current GERD management regimen as prescribed by primary care physician  Follow-up - Follow-up with primary care physician Dustin Folks in February - Ensure primary care physician refills all current medications - Return to cardiology if new symptoms or concerns arise.               Signed, Donato Schultz, MD

## 2023-07-01 NOTE — Patient Instructions (Signed)
Medication Instructions:  The current medical regimen is effective;  continue present plan and medications.  *If you need a refill on your cardiac medications before your next appointment, please call your pharmacy*  Follow-Up: At [facility], you and your health needs are our priority.  As part of our continuing mission to provide you with exceptional heart care, we have created designated Provider Care Teams.  These Care Teams include your primary Cardiologist (physician) and Advanced Practice Providers (APPs -  Physician Assistants and Nurse Practitioners) who all work together to provide you with the care you need, when you need it.  We recommend signing up for the patient portal called "MyChart".  Sign up information is provided on this After Visit Summary.  MyChart is used to connect with patients for Virtual Visits (Telemedicine).  Patients are able to view lab/test results, encounter notes, upcoming appointments, etc.  Non-urgent messages can be sent to your provider as well.   To learn more about what you can do with MyChart, go to [email or web address].    Your next appointment:   Follow up as needed with Dr Skains   

## 2023-07-20 DIAGNOSIS — E1169 Type 2 diabetes mellitus with other specified complication: Secondary | ICD-10-CM | POA: Diagnosis not present

## 2023-07-20 DIAGNOSIS — Z79899 Other long term (current) drug therapy: Secondary | ICD-10-CM | POA: Diagnosis not present

## 2023-07-20 DIAGNOSIS — M459 Ankylosing spondylitis of unspecified sites in spine: Secondary | ICD-10-CM | POA: Diagnosis not present

## 2023-07-24 DIAGNOSIS — A048 Other specified bacterial intestinal infections: Secondary | ICD-10-CM | POA: Diagnosis not present

## 2023-08-14 DIAGNOSIS — E1142 Type 2 diabetes mellitus with diabetic polyneuropathy: Secondary | ICD-10-CM | POA: Diagnosis not present

## 2023-08-14 DIAGNOSIS — E785 Hyperlipidemia, unspecified: Secondary | ICD-10-CM | POA: Diagnosis not present

## 2023-08-14 DIAGNOSIS — I10 Essential (primary) hypertension: Secondary | ICD-10-CM | POA: Diagnosis not present

## 2023-08-14 DIAGNOSIS — D72829 Elevated white blood cell count, unspecified: Secondary | ICD-10-CM | POA: Diagnosis not present

## 2023-08-27 DIAGNOSIS — Z85828 Personal history of other malignant neoplasm of skin: Secondary | ICD-10-CM | POA: Diagnosis not present

## 2023-08-27 DIAGNOSIS — C44319 Basal cell carcinoma of skin of other parts of face: Secondary | ICD-10-CM | POA: Diagnosis not present

## 2023-09-09 ENCOUNTER — Telehealth: Payer: Self-pay | Admitting: Internal Medicine

## 2023-09-09 NOTE — Telephone Encounter (Signed)
 Rescheduled appointments per provider requested. Left the patient a voicemail with the appointment details. The patient will be mailed appointment reminders.

## 2023-09-28 NOTE — Progress Notes (Unsigned)
 [facility] OFFICE PROGRESS NOTE  Ransom Byers, MD [address] [place] [address]  DIAGNOSIS:  Leukocytosis and anemia secondary to GI bleed.   PRIOR THERAPY: None  CURRENT THERAPY: ferrous sulfate p.o. daily   INTERVAL HISTORY: Steven Mcclure 73 y.o. male returns to the clinic today for a follow-up visit.  The patient establish care with us  for leukocytosis which was felt to be reactive in the setting of the patient having cholecystectomy due to cholecystitis. His JAK2 and BCR/able were negative. We recommended that he stay on observation with repeat blood work and follow-up. He also has arthritis.   Of note, the patient also has a history of anemia and GI bleed after a colonoscopy he had a polypectomy that blood and his anemia was felt to be secondary to polyp bleeding.  At his last appointment, he was found to be iron deficient with mild anemia.  He was instructed to take an iron supplement.  The patient states that over-the-counter iron caused constipation.  He would consider trying iron again with alternative options.  He does bruise easy secondary to being on aspirin  but denies any bleeding.    Patient's blood pressure was high initially when he was seen in the clinic.  He states this is pretty typical but usually improves by the end of the visit.  He took his lisinopril  as prescribed today.  He is not having any signs or symptoms related to his high blood pressure.  He tells me in the interval since last being seen that he had a skin cancer removed from his face.  He follows closely with dermatology for skin checks every 6 months.  Overall he is feeling well today.  He denies any steroid use.  Denies any recent infections, specifically he denies any nasal congestion, sore throat, cough, skin infections, dysuria, or diarrhea.  He denies any recent fever, chills, night sweats, or unexplained weight loss.  Denies any hormone use.  He is here today for  evaluation and repeat blood work.   MEDICAL HISTORY: Past Medical History:  Diagnosis Date   Body mass index 40.0-44.9, adult (HCC)    CKD (chronic kidney disease), stage III (HCC)    Coronary atherosclerosis due to severely calcified coronary lesion    Diabetes mellitus without complication (HCC)    ED (erectile dysfunction)    Gout    History of kidney stones    Hx of colonic polyps    Hyperlipidemia    Hypertension    Long-term insulin  use in type 2 diabetes (HCC)    Low testosterone in male    Obesity    Vertigo    Vitamin B12 deficiency     ALLERGIES:  is allergic to crestor  [rosuvastatin ], lipitor [atorvastatin], hydrocod poli-chlorphe poli er, and tussin cough [dextromethorphan hbr].  MEDICATIONS:  Current Outpatient Medications  Medication Sig Dispense Refill   acetaminophen  (TYLENOL ) 325 MG tablet Take 2 tablets (650 mg total) by mouth every 6 (six) hours as needed for headache or mild pain.     albuterol  (VENTOLIN  HFA) 108 (90 Base) MCG/ACT inhaler Inhale 1-2 puffs into the lungs every 6 (six) hours as needed for shortness of breath or wheezing.     allopurinol  (ZYLOPRIM ) 100 MG tablet TAKE 1 TABLET BY MOUTH TWICE A DAY (Patient taking differently: Take 200 mg by mouth daily.) 180 tablet 3   aspirin  EC 81 MG tablet Take 81 mg by mouth daily.     Cyanocobalamin  (VITAMIN B-12 PO)  Take 1 tablet by mouth daily.     ezetimibe (ZETIA) 10 MG tablet Take 10 mg by mouth every other day.     leflunomide (ARAVA) 20 MG tablet Take 20 mg by mouth daily.     lisinopril  (ZESTRIL ) 20 MG tablet Take 1 tablet (20 mg total) by mouth daily. 90 tablet 3   loratadine (CLARITIN) 10 MG tablet Take 20 mg by mouth daily.     metFORMIN  (GLUCOPHAGE ) 1000 MG tablet Take 1 tablet (1,000 mg total) by mouth daily.  10   Multiple Vitamin (MULTIVITAMIN WITH MINERALS) TABS tablet Take 1 tablet by mouth daily.     Omega-3 Fatty Acids (FISH OIL) 1200 MG CAPS Take 1,200 mg by mouth daily.     pantoprazole   (PROTONIX ) 40 MG tablet Take 1 tablet (40 mg total) by mouth daily. 30 tablet 2   rosuvastatin  (CRESTOR ) 20 MG tablet Take 1 tablet (20 mg total) by mouth daily. 90 tablet 3   TRULICITY 3 MG/0.5ML SOPN Inject 3 mg as directed every Friday.     Current Facility-Administered Medications  Medication Dose Route Frequency Provider Last Rate Last Admin   triamcinolone  acetonide (KENALOG ) 10 MG/ML injection 10 mg  10 mg Other Once Charity Conch, DPM        SURGICAL HISTORY:  Past Surgical History:  Procedure Laterality Date   CHOLECYSTECTOMY N/A 11/04/2022   Procedure: LAPAROSCOPIC CHOLECYSTECTOMY WITH ICG DYE;  Surgeon: Adalberto Acton, MD;  Location: [facility] OR;  Service: General;  Laterality: N/A;   KIDNEY STONE SURGERY     LEFT HEART CATH AND CORONARY ANGIOGRAPHY N/A 12/24/2020   Procedure: LEFT HEART CATH AND CORONARY ANGIOGRAPHY;  Surgeon: Lucendia Rusk, MD;  Location: [facility] INVASIVE CV LAB;  Service: Cardiovascular;  Laterality: N/A;    REVIEW OF SYSTEMS:   Constitutional: Negative for appetite change, chills, fatigue, fever and unexpected weight change.  HENT:   Negative for mouth sores, nosebleeds, sore throat and trouble swallowing.   Eyes: Negative for eye problems and icterus.  Respiratory: Negative for cough, hemoptysis, shortness of breath and wheezing.   Cardiovascular: Negative for chest pain and leg swelling.  Gastrointestinal: Negative for abdominal pain, constipation, diarrhea, nausea and vomiting.  Genitourinary: Negative for bladder incontinence, difficulty urinating, dysuria, frequency and hematuria.   Musculoskeletal: Positive for joint stiffness/swelling. Negative for back pain, gait problem, neck pain and neck stiffness.  Skin: Negative for itching and rash.  Neurological: Negative for dizziness, extremity weakness, gait problem, headaches, light-headedness and seizures.  Hematological: Negative for adenopathy. Does not bruise/bleed easily.   Psychiatric/Behavioral: Negative for confusion, depression and sleep disturbance. The patient is not nervous/anxious.    PHYSICAL EXAMINATION:  There were no vitals taken for this visit.  ECOG PERFORMANCE STATUS: 1  Physical Exam  Constitutional: Oriented to person, place, and time and well-developed, well-nourished, and in no distress.  HENT:  Head: Normocephalic and atraumatic.  Mouth/Throat: Oropharynx is clear and moist. No oropharyngeal exudate.  Eyes: Conjunctivae are normal. Right eye exhibits no discharge. Left eye exhibits no discharge. No scleral icterus.  Neck: Normal range of motion. Neck supple.  Cardiovascular: Normal rate, regular rhythm, normal heart sounds and intact distal pulses.   Pulmonary/Chest: Effort normal and breath sounds normal. No respiratory distress. No wheezes. No rales.  Abdominal: Soft. Bowel sounds are normal. Exhibits no distension and no mass. There is no tenderness.  Musculoskeletal: Normal range of motion. Exhibits no edema.  Lymphadenopathy:    No cervical adenopathy.  Neurological: Alert  and oriented to person, place, and time. Exhibits normal muscle tone. Gait normal. Coordination normal.  Skin: Skin is warm and dry. No rash noted. Not diaphoretic. No erythema. No pallor.  Psychiatric: Mood, memory and judgment normal.  Vitals reviewed.    LABORATORY DATA: Lab Results  Component Value Date   WBC 11.4 (H) 04/03/2023   HGB 10.9 (L) 04/03/2023   HCT 33.2 (L) 04/03/2023   MCV 81.6 04/03/2023   PLT 298 04/03/2023      Chemistry      Component Value Date/Time   NA 135 02/20/2023 0357   NA 141 05/14/2021 1435   K 4.0 02/20/2023 0357   CL 103 02/20/2023 0357   CO2 26 02/20/2023 0357   BUN 32 (H) 02/20/2023 0357   BUN 24 05/14/2021 1435   CREATININE 1.60 (H) 02/20/2023 0357   CREATININE 1.27 (H) 12/17/2022 1251   CREATININE 1.61 (H) 08/02/2019 0943      Component Value Date/Time   CALCIUM  8.0 (L) 02/20/2023 0357   ALKPHOS 88  02/20/2023 0357   AST 185 (H) 02/20/2023 0357   AST 21 12/17/2022 1251   ALT 279 (H) 02/20/2023 0357   ALT 23 12/17/2022 1251   BILITOT 1.0 02/20/2023 0357   BILITOT 0.5 12/17/2022 1251       RADIOGRAPHIC STUDIES:  No results found.   ASSESSMENT/PLAN:  This is a very pleasant 73 year old Caucasian male referred to clinic for leukocytosis. This is felt to be reactive. His JAK2 and BCR/abl was negative.   The patient also has iron deficiency anemia.  His labs today show mild microcytic anemia with an MCV at 79 and elevated RDW and hemoglobin of 11.2.  The patient is not taking any iron supplements at this time.  We discussed increasing dietary intake of iron rich food, and iron supplementation with alternative options that may be more tolerable including slow release iron, liquid iron, or prescription iron.  If he has intolerance, we can consider IV iron.  The patient would like to try slow release iron at this time.  If he has any trouble tolerating this then he was advised to call and we can discuss alternatives.  We will see him back in 6 months with labs and follow-up visit.  Previously talked with the patient about seeing him on an as-needed basis for the leukocytosis but given the anemia I think it be a good idea to see him again in 6 months.  The patient was advised to call immediately if he has any concerning symptoms in the interval. The patient voices understanding of current disease status and treatment options and is in agreement with the current care plan. All questions were answered. The patient knows to call the clinic with any problems, questions or concerns. We can certainly see the patient much sooner if necessary    No orders of the defined types were placed in this encounter.    The total time spent in the appointment was 20-29 minutes  Hadeel Hillebrand L Kathee Tumlin, PA-C 09/28/23

## 2023-10-01 ENCOUNTER — Inpatient Hospital Stay: Attending: Physician Assistant

## 2023-10-01 ENCOUNTER — Inpatient Hospital Stay: Admitting: Physician Assistant

## 2023-10-01 ENCOUNTER — Telehealth: Payer: Self-pay | Admitting: Internal Medicine

## 2023-10-01 VITALS — BP 136/72 | HR 73 | Temp 98.2°F | Resp 15 | Wt 274.0 lb

## 2023-10-01 DIAGNOSIS — D509 Iron deficiency anemia, unspecified: Secondary | ICD-10-CM | POA: Insufficient documentation

## 2023-10-01 DIAGNOSIS — D649 Anemia, unspecified: Secondary | ICD-10-CM

## 2023-10-01 DIAGNOSIS — Z79899 Other long term (current) drug therapy: Secondary | ICD-10-CM | POA: Diagnosis not present

## 2023-10-01 DIAGNOSIS — D72829 Elevated white blood cell count, unspecified: Secondary | ICD-10-CM | POA: Diagnosis not present

## 2023-10-01 LAB — CBC WITH DIFFERENTIAL (CANCER CENTER ONLY)
Abs Immature Granulocytes: 0.05 10*3/uL (ref 0.00–0.07)
Basophils Absolute: 0.1 10*3/uL (ref 0.0–0.1)
Basophils Relative: 1 %
Eosinophils Absolute: 0.4 10*3/uL (ref 0.0–0.5)
Eosinophils Relative: 3 %
HCT: 36 % — ABNORMAL LOW (ref 39.0–52.0)
Hemoglobin: 11.2 g/dL — ABNORMAL LOW (ref 13.0–17.0)
Immature Granulocytes: 0 %
Lymphocytes Relative: 22 %
Lymphs Abs: 2.9 10*3/uL (ref 0.7–4.0)
MCH: 24.8 pg — ABNORMAL LOW (ref 26.0–34.0)
MCHC: 31.1 g/dL (ref 30.0–36.0)
MCV: 79.8 fL — ABNORMAL LOW (ref 80.0–100.0)
Monocytes Absolute: 1 10*3/uL (ref 0.1–1.0)
Monocytes Relative: 8 %
Neutro Abs: 8.8 10*3/uL — ABNORMAL HIGH (ref 1.7–7.7)
Neutrophils Relative %: 66 %
Platelet Count: 268 10*3/uL (ref 150–400)
RBC: 4.51 MIL/uL (ref 4.22–5.81)
RDW: 16.4 % — ABNORMAL HIGH (ref 11.5–15.5)
WBC Count: 13.3 10*3/uL — ABNORMAL HIGH (ref 4.0–10.5)
nRBC: 0 % (ref 0.0–0.2)

## 2023-10-01 NOTE — Telephone Encounter (Signed)
 Left a voicemail with the appointment details and he will be mailed an appointment reminder.

## 2023-10-02 ENCOUNTER — Ambulatory Visit: Payer: PPO | Admitting: Physician Assistant

## 2023-10-02 ENCOUNTER — Telehealth: Payer: Self-pay

## 2023-10-02 ENCOUNTER — Other Ambulatory Visit: Payer: PPO

## 2023-10-02 NOTE — Telephone Encounter (Signed)
 TC from patient returning call from scheduler's VM.  Informed patient of appt time and date.  Patient voiced understanding.

## 2023-10-18 ENCOUNTER — Other Ambulatory Visit: Payer: Self-pay | Admitting: Podiatry

## 2023-10-21 ENCOUNTER — Other Ambulatory Visit: Payer: Self-pay | Admitting: Podiatry

## 2023-10-22 DIAGNOSIS — D649 Anemia, unspecified: Secondary | ICD-10-CM | POA: Diagnosis not present

## 2023-10-22 DIAGNOSIS — R052 Subacute cough: Secondary | ICD-10-CM | POA: Diagnosis not present

## 2023-10-22 DIAGNOSIS — E1142 Type 2 diabetes mellitus with diabetic polyneuropathy: Secondary | ICD-10-CM | POA: Diagnosis not present

## 2023-10-22 DIAGNOSIS — E785 Hyperlipidemia, unspecified: Secondary | ICD-10-CM | POA: Diagnosis not present

## 2023-10-22 DIAGNOSIS — N1832 Chronic kidney disease, stage 3b: Secondary | ICD-10-CM | POA: Diagnosis not present

## 2023-10-22 DIAGNOSIS — M459 Ankylosing spondylitis of unspecified sites in spine: Secondary | ICD-10-CM | POA: Diagnosis not present

## 2023-10-22 DIAGNOSIS — M109 Gout, unspecified: Secondary | ICD-10-CM | POA: Diagnosis not present

## 2023-10-22 DIAGNOSIS — I10 Essential (primary) hypertension: Secondary | ICD-10-CM | POA: Diagnosis not present

## 2023-10-22 DIAGNOSIS — Z0000 Encounter for general adult medical examination without abnormal findings: Secondary | ICD-10-CM | POA: Diagnosis not present

## 2023-12-09 DIAGNOSIS — M109 Gout, unspecified: Secondary | ICD-10-CM | POA: Diagnosis not present

## 2023-12-09 DIAGNOSIS — Z79899 Other long term (current) drug therapy: Secondary | ICD-10-CM | POA: Diagnosis not present

## 2023-12-09 DIAGNOSIS — M25473 Effusion, unspecified ankle: Secondary | ICD-10-CM | POA: Diagnosis not present

## 2023-12-09 DIAGNOSIS — M79643 Pain in unspecified hand: Secondary | ICD-10-CM | POA: Diagnosis not present

## 2023-12-09 DIAGNOSIS — M7989 Other specified soft tissue disorders: Secondary | ICD-10-CM | POA: Diagnosis not present

## 2023-12-09 DIAGNOSIS — N1831 Chronic kidney disease, stage 3a: Secondary | ICD-10-CM | POA: Diagnosis not present

## 2023-12-09 DIAGNOSIS — M459 Ankylosing spondylitis of unspecified sites in spine: Secondary | ICD-10-CM | POA: Diagnosis not present

## 2023-12-09 DIAGNOSIS — M25572 Pain in left ankle and joints of left foot: Secondary | ICD-10-CM | POA: Diagnosis not present

## 2023-12-09 DIAGNOSIS — E1169 Type 2 diabetes mellitus with other specified complication: Secondary | ICD-10-CM | POA: Diagnosis not present

## 2023-12-09 DIAGNOSIS — K760 Fatty (change of) liver, not elsewhere classified: Secondary | ICD-10-CM | POA: Diagnosis not present

## 2024-01-04 DIAGNOSIS — N5201 Erectile dysfunction due to arterial insufficiency: Secondary | ICD-10-CM | POA: Diagnosis not present

## 2024-01-04 DIAGNOSIS — N400 Enlarged prostate without lower urinary tract symptoms: Secondary | ICD-10-CM | POA: Diagnosis not present

## 2024-01-18 DIAGNOSIS — D1801 Hemangioma of skin and subcutaneous tissue: Secondary | ICD-10-CM | POA: Diagnosis not present

## 2024-01-18 DIAGNOSIS — D225 Melanocytic nevi of trunk: Secondary | ICD-10-CM | POA: Diagnosis not present

## 2024-01-18 DIAGNOSIS — L570 Actinic keratosis: Secondary | ICD-10-CM | POA: Diagnosis not present

## 2024-01-18 DIAGNOSIS — L814 Other melanin hyperpigmentation: Secondary | ICD-10-CM | POA: Diagnosis not present

## 2024-01-18 DIAGNOSIS — Z85828 Personal history of other malignant neoplasm of skin: Secondary | ICD-10-CM | POA: Diagnosis not present

## 2024-01-18 DIAGNOSIS — L821 Other seborrheic keratosis: Secondary | ICD-10-CM | POA: Diagnosis not present

## 2024-03-10 DIAGNOSIS — M109 Gout, unspecified: Secondary | ICD-10-CM | POA: Diagnosis not present

## 2024-03-10 DIAGNOSIS — Z23 Encounter for immunization: Secondary | ICD-10-CM | POA: Diagnosis not present

## 2024-03-10 DIAGNOSIS — K760 Fatty (change of) liver, not elsewhere classified: Secondary | ICD-10-CM | POA: Diagnosis not present

## 2024-03-10 DIAGNOSIS — M7989 Other specified soft tissue disorders: Secondary | ICD-10-CM | POA: Diagnosis not present

## 2024-03-10 DIAGNOSIS — Z79899 Other long term (current) drug therapy: Secondary | ICD-10-CM | POA: Diagnosis not present

## 2024-03-10 DIAGNOSIS — M25473 Effusion, unspecified ankle: Secondary | ICD-10-CM | POA: Diagnosis not present

## 2024-03-10 DIAGNOSIS — N1831 Chronic kidney disease, stage 3a: Secondary | ICD-10-CM | POA: Diagnosis not present

## 2024-03-10 DIAGNOSIS — M459 Ankylosing spondylitis of unspecified sites in spine: Secondary | ICD-10-CM | POA: Diagnosis not present

## 2024-03-10 DIAGNOSIS — M79643 Pain in unspecified hand: Secondary | ICD-10-CM | POA: Diagnosis not present

## 2024-03-10 DIAGNOSIS — M25572 Pain in left ankle and joints of left foot: Secondary | ICD-10-CM | POA: Diagnosis not present

## 2024-03-10 DIAGNOSIS — E1169 Type 2 diabetes mellitus with other specified complication: Secondary | ICD-10-CM | POA: Diagnosis not present

## 2024-03-30 ENCOUNTER — Other Ambulatory Visit: Payer: Self-pay | Admitting: *Deleted

## 2024-03-30 DIAGNOSIS — D509 Iron deficiency anemia, unspecified: Secondary | ICD-10-CM

## 2024-03-30 DIAGNOSIS — D72829 Elevated white blood cell count, unspecified: Secondary | ICD-10-CM

## 2024-03-31 ENCOUNTER — Inpatient Hospital Stay: Admitting: Internal Medicine

## 2024-03-31 ENCOUNTER — Inpatient Hospital Stay: Attending: Internal Medicine

## 2024-03-31 VITALS — BP 128/76 | HR 83 | Temp 97.7°F | Resp 17 | Ht 71.0 in | Wt 274.0 lb

## 2024-03-31 DIAGNOSIS — Z794 Long term (current) use of insulin: Secondary | ICD-10-CM | POA: Insufficient documentation

## 2024-03-31 DIAGNOSIS — D508 Other iron deficiency anemias: Secondary | ICD-10-CM | POA: Diagnosis not present

## 2024-03-31 DIAGNOSIS — E1122 Type 2 diabetes mellitus with diabetic chronic kidney disease: Secondary | ICD-10-CM | POA: Insufficient documentation

## 2024-03-31 DIAGNOSIS — I129 Hypertensive chronic kidney disease with stage 1 through stage 4 chronic kidney disease, or unspecified chronic kidney disease: Secondary | ICD-10-CM | POA: Diagnosis not present

## 2024-03-31 DIAGNOSIS — K922 Gastrointestinal hemorrhage, unspecified: Secondary | ICD-10-CM | POA: Insufficient documentation

## 2024-03-31 DIAGNOSIS — D500 Iron deficiency anemia secondary to blood loss (chronic): Secondary | ICD-10-CM | POA: Insufficient documentation

## 2024-03-31 DIAGNOSIS — Z79899 Other long term (current) drug therapy: Secondary | ICD-10-CM | POA: Diagnosis not present

## 2024-03-31 DIAGNOSIS — D72829 Elevated white blood cell count, unspecified: Secondary | ICD-10-CM | POA: Insufficient documentation

## 2024-03-31 DIAGNOSIS — Z7984 Long term (current) use of oral hypoglycemic drugs: Secondary | ICD-10-CM | POA: Insufficient documentation

## 2024-03-31 DIAGNOSIS — Z7969 Long term (current) use of other immunomodulators and immunosuppressants: Secondary | ICD-10-CM | POA: Insufficient documentation

## 2024-03-31 DIAGNOSIS — Z7982 Long term (current) use of aspirin: Secondary | ICD-10-CM | POA: Diagnosis not present

## 2024-03-31 DIAGNOSIS — N183 Chronic kidney disease, stage 3 unspecified: Secondary | ICD-10-CM | POA: Insufficient documentation

## 2024-03-31 DIAGNOSIS — Z7985 Long-term (current) use of injectable non-insulin antidiabetic drugs: Secondary | ICD-10-CM | POA: Insufficient documentation

## 2024-03-31 DIAGNOSIS — D509 Iron deficiency anemia, unspecified: Secondary | ICD-10-CM

## 2024-03-31 LAB — CBC WITH DIFFERENTIAL (CANCER CENTER ONLY)
Abs Immature Granulocytes: 0.04 K/uL (ref 0.00–0.07)
Basophils Absolute: 0.1 K/uL (ref 0.0–0.1)
Basophils Relative: 1 %
Eosinophils Absolute: 0.3 K/uL (ref 0.0–0.5)
Eosinophils Relative: 3 %
HCT: 44.9 % (ref 39.0–52.0)
Hemoglobin: 14.8 g/dL (ref 13.0–17.0)
Immature Granulocytes: 0 %
Lymphocytes Relative: 27 %
Lymphs Abs: 3.3 K/uL (ref 0.7–4.0)
MCH: 27.7 pg (ref 26.0–34.0)
MCHC: 33 g/dL (ref 30.0–36.0)
MCV: 83.9 fL (ref 80.0–100.0)
Monocytes Absolute: 0.9 K/uL (ref 0.1–1.0)
Monocytes Relative: 7 %
Neutro Abs: 7.6 K/uL (ref 1.7–7.7)
Neutrophils Relative %: 62 %
Platelet Count: 249 K/uL (ref 150–400)
RBC: 5.35 MIL/uL (ref 4.22–5.81)
RDW: 15.6 % — ABNORMAL HIGH (ref 11.5–15.5)
WBC Count: 12.2 K/uL — ABNORMAL HIGH (ref 4.0–10.5)
nRBC: 0 % (ref 0.0–0.2)

## 2024-03-31 LAB — CMP (CANCER CENTER ONLY)
ALT: 13 U/L (ref 0–44)
AST: 22 U/L (ref 15–41)
Albumin: 4 g/dL (ref 3.5–5.0)
Alkaline Phosphatase: 74 U/L (ref 38–126)
Anion gap: 8 (ref 5–15)
BUN: 20 mg/dL (ref 8–23)
CO2: 27 mmol/L (ref 22–32)
Calcium: 9.3 mg/dL (ref 8.9–10.3)
Chloride: 104 mmol/L (ref 98–111)
Creatinine: 1.42 mg/dL — ABNORMAL HIGH (ref 0.61–1.24)
GFR, Estimated: 52 mL/min — ABNORMAL LOW (ref >60)
Glucose, Bld: 84 mg/dL (ref 70–99)
Potassium: 4.4 mmol/L (ref 3.5–5.1)
Sodium: 139 mmol/L (ref 135–145)
Total Bilirubin: 0.4 mg/dL (ref 0.0–1.2)
Total Protein: 7.4 g/dL (ref 6.5–8.1)

## 2024-03-31 LAB — IRON AND IRON BINDING CAPACITY (CC-WL,HP ONLY)
Iron: 115 ug/dL (ref 45–182)
Saturation Ratios: 26 % (ref 17.9–39.5)
TIBC: 442 ug/dL (ref 250–450)
UIBC: 327 ug/dL (ref 117–376)

## 2024-03-31 LAB — VITAMIN B12: Vitamin B-12: 1415 pg/mL — ABNORMAL HIGH (ref 180–914)

## 2024-03-31 LAB — FERRITIN: Ferritin: 24 ng/mL (ref 24–336)

## 2024-03-31 NOTE — Progress Notes (Signed)
 [facility] [phone]   [phone]  OFFICE PROGRESS NOTE  Chrystal Lamarr RAMAN, MD [address] [place] [address]  DIAGNOSIS: Anemia and leukocytosis secondary to gastrointestinal hemorrhage.  JAK2 mutation panel was negative.  PRIOR THERAPY: None  CURRENT THERAPY: Over-the-counter ferrous sulfate once daily.  INTERVAL HISTORY: Steven Mcclure 73 y.o. male returns to the clinic today for follow-up visit. Discussed the use of AI scribe software for clinical note transcription with the patient, who gave verbal consent to proceed.  History of Present Illness Steven Mcclure is a 73 year old male with iron deficiency anemia and leukocytosis who presents for evaluation and repeat blood work.  He has a history of iron deficiency anemia and leukocytosis. He has a history of iron deficiency anemia and leukocytosis with a negative JAK2 mutation panel. He is currently taking over-the-counter ferrous sulfate every other day. He also takes a multivitamin daily but avoids vitamin C or orange juice due to type 2 diabetes.  Recent blood work shows an improvement in hemoglobin levels, increasing from 10.9 in October to 14.8. His white blood cell count was 12.2, improved from 13.3 six months ago.  No recent infections such as sinus, urinary tract, or upper respiratory infections. He is not taking any hormones. His current medications include a multivitamin, vitamin B, fish oil, and a baby aspirin  daily.     MEDICAL HISTORY: Past Medical History:  Diagnosis Date   Body mass index 40.0-44.9, adult (HCC)    CKD (chronic kidney disease), stage III (HCC)    Coronary atherosclerosis due to severely calcified coronary lesion    Diabetes mellitus without complication (HCC)    ED (erectile dysfunction)    Gout    History of kidney stones    Hx of colonic polyps    Hyperlipidemia    Hypertension    Long-term insulin  use in type 2 diabetes (HCC)     Low testosterone in male    Obesity    Vertigo    Vitamin B12 deficiency     ALLERGIES:  is allergic to crestor  [rosuvastatin ], lipitor [atorvastatin], hydrocod poli-chlorphe poli er, and tussin cough [dextromethorphan hbr].  MEDICATIONS:  Current Outpatient Medications  Medication Sig Dispense Refill   acetaminophen  (TYLENOL ) 325 MG tablet Take 2 tablets (650 mg total) by mouth every 6 (six) hours as needed for headache or mild pain.     albuterol  (VENTOLIN  HFA) 108 (90 Base) MCG/ACT inhaler Inhale 1-2 puffs into the lungs every 6 (six) hours as needed for shortness of breath or wheezing.     allopurinol  (ZYLOPRIM ) 100 MG tablet TAKE 1 TABLET BY MOUTH TWICE A DAY (Patient taking differently: Take 200 mg by mouth daily.) 180 tablet 3   aspirin  EC 81 MG tablet Take 81 mg by mouth daily.     BD INSULIN  SYRINGE U/F 31G X 5/16 1 ML MISC as directed.     Cyanocobalamin  (VITAMIN B-12 PO) Take 1 tablet by mouth daily.     doxycycline  (VIBRAMYCIN ) 100 MG capsule Take 100 mg by mouth 2 (two) times daily.     ezetimibe (ZETIA) 10 MG tablet Take 10 mg by mouth every other day.     LANTUS  100 UNIT/ML injection SMARTSIG:135 Unit(s) SUB-Q Daily     leflunomide (ARAVA) 20 MG tablet Take 20 mg by mouth daily.     lisinopril  (ZESTRIL ) 20 MG tablet Take 1 tablet (20 mg total) by mouth daily. 90 tablet 3   loratadine (  CLARITIN) 10 MG tablet Take 20 mg by mouth daily.     metFORMIN  (GLUCOPHAGE ) 1000 MG tablet Take 1 tablet (1,000 mg total) by mouth daily.  10   Multiple Vitamin (MULTIVITAMIN WITH MINERALS) TABS tablet Take 1 tablet by mouth daily.     Omega-3 Fatty Acids (FISH OIL) 1200 MG CAPS Take 1,200 mg by mouth daily.     omeprazole (PRILOSEC) 20 MG capsule Take 20 mg by mouth every morning.     rosuvastatin  (CRESTOR ) 20 MG tablet Take 1 tablet (20 mg total) by mouth daily. 90 tablet 3   TRULICITY 3 MG/0.5ML SOPN Inject 3 mg as directed every Friday.     Current Facility-Administered Medications   Medication Dose Route Frequency Provider Last Rate Last Admin   triamcinolone  acetonide (KENALOG ) 10 MG/ML injection 10 mg  10 mg Other Once Gershon Donnice SAUNDERS, DPM        SURGICAL HISTORY:  Past Surgical History:  Procedure Laterality Date   CHOLECYSTECTOMY N/A 11/04/2022   Procedure: LAPAROSCOPIC CHOLECYSTECTOMY WITH ICG DYE;  Surgeon: Signe Mitzie LABOR, MD;  Location: [facility] OR;  Service: General;  Laterality: N/A;   KIDNEY STONE SURGERY     LEFT HEART CATH AND CORONARY ANGIOGRAPHY N/A 12/24/2020   Procedure: LEFT HEART CATH AND CORONARY ANGIOGRAPHY;  Surgeon: Dann Candyce RAMAN, MD;  Location: [facility] INVASIVE CV LAB;  Service: Cardiovascular;  Laterality: N/A;    REVIEW OF SYSTEMS:  A comprehensive review of systems was negative.   PHYSICAL EXAMINATION: General appearance: alert, appears stated age, and no distress Head: Normocephalic, without obvious abnormality, atraumatic Neck: no adenopathy, no JVD, supple, symmetrical, trachea midline, and thyroid  not enlarged, symmetric, no tenderness/mass/nodules Lymph nodes: Cervical, supraclavicular, and axillary nodes normal. Resp: clear to auscultation bilaterally Back: symmetric, no curvature. ROM normal. No CVA tenderness. Cardio: regular rate and rhythm, S1, S2 normal, no murmur, click, rub or gallop GI: soft, non-tender; bowel sounds normal; no masses,  no organomegaly Extremities: extremities normal, atraumatic, no cyanosis or edema  ECOG PERFORMANCE STATUS: 0 - Asymptomatic  Blood pressure 128/76, pulse 83, temperature 97.7 F (36.5 C), temperature source Temporal, resp. rate 17, height 5' 11 (1.803 m), weight 274 lb (124.3 kg), SpO2 97%.  LABORATORY DATA: Lab Results  Component Value Date   WBC 12.2 (H) 03/31/2024   HGB 14.8 03/31/2024   HCT 44.9 03/31/2024   MCV 83.9 03/31/2024   PLT 249 03/31/2024      Chemistry      Component Value Date/Time   NA 135 02/20/2023 0357   NA 141 05/14/2021 1435   K 4.0 02/20/2023 0357    CL 103 02/20/2023 0357   CO2 26 02/20/2023 0357   BUN 32 (H) 02/20/2023 0357   BUN 24 05/14/2021 1435   CREATININE 1.60 (H) 02/20/2023 0357   CREATININE 1.27 (H) 12/17/2022 1251   CREATININE 1.61 (H) 08/02/2019 0943      Component Value Date/Time   CALCIUM  8.0 (L) 02/20/2023 0357   ALKPHOS 88 02/20/2023 0357   AST 185 (H) 02/20/2023 0357   AST 21 12/17/2022 1251   ALT 279 (H) 02/20/2023 0357   ALT 23 12/17/2022 1251   BILITOT 1.0 02/20/2023 0357   BILITOT 0.5 12/17/2022 1251       RADIOGRAPHIC STUDIES: No results found.  ASSESSMENT AND PLAN:  Assessment and Plan Assessment & Plan Reactive leukocytosis Chronic elevation of white blood cell count, likely reactive in nature. Negative JAK2 mutation panel. Current white blood cell count is 12.2, improved  from 13.3 six months ago. No signs of infection or other causes identified. No current symptoms suggestive of infection or inflammation. - Monitor white blood cell count - Follow up in six months to reassess white blood cell count  Iron deficiency anemia, resolved with oral iron therapy Iron deficiency anemia resolved with oral iron therapy. Hemoglobin improved to 14.8 from 11.2 in April and 10.9 in October. No longer anemic. Oral iron therapy effective without need for iron infusion. - Continue oral iron therapy every other day - Monitor hemoglobin levels at follow-up in six months The patient was advised to call immediately if he has any other concerning symptoms in the interval. The patient voices understanding of current disease status and treatment options and is in agreement with the current care plan.  All questions were answered. The patient knows to call the clinic with any problems, questions or concerns. We can certainly see the patient much sooner if necessary.   Disclaimer: This note was dictated with voice recognition software. Similar sounding words can inadvertently be transcribed and may not be corrected upon  review.

## 2024-04-01 ENCOUNTER — Telehealth: Payer: Self-pay | Admitting: Internal Medicine

## 2024-04-01 NOTE — Telephone Encounter (Signed)
 Scheduled appointments with the patient and he is active on MyChart.

## 2024-04-25 DIAGNOSIS — R2243 Localized swelling, mass and lump, lower limb, bilateral: Secondary | ICD-10-CM | POA: Diagnosis not present

## 2024-04-25 DIAGNOSIS — E785 Hyperlipidemia, unspecified: Secondary | ICD-10-CM | POA: Diagnosis not present

## 2024-04-25 DIAGNOSIS — I10 Essential (primary) hypertension: Secondary | ICD-10-CM | POA: Diagnosis not present

## 2024-04-25 DIAGNOSIS — E1142 Type 2 diabetes mellitus with diabetic polyneuropathy: Secondary | ICD-10-CM | POA: Diagnosis not present

## 2024-05-04 DIAGNOSIS — K5730 Diverticulosis of large intestine without perforation or abscess without bleeding: Secondary | ICD-10-CM | POA: Diagnosis not present

## 2024-05-04 DIAGNOSIS — Z860101 Personal history of adenomatous and serrated colon polyps: Secondary | ICD-10-CM | POA: Diagnosis not present

## 2024-05-04 DIAGNOSIS — K635 Polyp of colon: Secondary | ICD-10-CM | POA: Diagnosis not present

## 2024-05-04 DIAGNOSIS — Z09 Encounter for follow-up examination after completed treatment for conditions other than malignant neoplasm: Secondary | ICD-10-CM | POA: Diagnosis not present

## 2024-05-04 DIAGNOSIS — D124 Benign neoplasm of descending colon: Secondary | ICD-10-CM | POA: Diagnosis not present

## 2024-05-12 DIAGNOSIS — D124 Benign neoplasm of descending colon: Secondary | ICD-10-CM | POA: Diagnosis not present

## 2024-05-12 DIAGNOSIS — K635 Polyp of colon: Secondary | ICD-10-CM | POA: Diagnosis not present

## 2024-09-29 ENCOUNTER — Inpatient Hospital Stay

## 2024-09-29 ENCOUNTER — Inpatient Hospital Stay: Admitting: Internal Medicine
